# Patient Record
Sex: Male | Born: 1955 | Race: White | Hispanic: No | Marital: Married | Smoking: Former smoker
Health system: Southern US, Community
[De-identification: ages and names within clinical notes are randomized; demographics above are authoritative.]

## PROBLEM LIST (undated history)

## (undated) DIAGNOSIS — I10 Essential (primary) hypertension: Secondary | ICD-10-CM

## (undated) DIAGNOSIS — E039 Hypothyroidism, unspecified: Secondary | ICD-10-CM

## (undated) DIAGNOSIS — E7800 Pure hypercholesterolemia, unspecified: Secondary | ICD-10-CM

## (undated) DIAGNOSIS — I495 Sick sinus syndrome: Secondary | ICD-10-CM

## (undated) DIAGNOSIS — D759 Disease of blood and blood-forming organs, unspecified: Secondary | ICD-10-CM

## (undated) DIAGNOSIS — Z87442 Personal history of urinary calculi: Secondary | ICD-10-CM

## (undated) DIAGNOSIS — I480 Paroxysmal atrial fibrillation: Secondary | ICD-10-CM

## (undated) DIAGNOSIS — Z45010 Encounter for checking and testing of cardiac pacemaker pulse generator [battery]: Secondary | ICD-10-CM

## (undated) DIAGNOSIS — N189 Chronic kidney disease, unspecified: Secondary | ICD-10-CM

## (undated) DIAGNOSIS — R001 Bradycardia, unspecified: Secondary | ICD-10-CM

## (undated) DIAGNOSIS — G709 Myoneural disorder, unspecified: Secondary | ICD-10-CM

## (undated) HISTORY — PX: LITHOTRIPSY: SUR834

## (undated) HISTORY — DX: Pure hypercholesterolemia, unspecified: E78.00

## (undated) HISTORY — DX: Hypothyroidism, unspecified: E03.9

## (undated) HISTORY — DX: Sick sinus syndrome: I49.5

## (undated) HISTORY — DX: Personal history of urinary calculi: Z87.442

## (undated) HISTORY — DX: Paroxysmal atrial fibrillation: I48.0

## (undated) HISTORY — DX: Encounter for checking and testing of cardiac pacemaker pulse generator (battery): Z45.010

## (undated) HISTORY — DX: Bradycardia, unspecified: R00.1

## (undated) HISTORY — PX: CHOLECYSTECTOMY: SHX55

---

## 1998-06-22 ENCOUNTER — Inpatient Hospital Stay (HOSPITAL_COMMUNITY): Admission: EM | Admit: 1998-06-22 | Discharge: 1998-06-25 | Payer: Self-pay | Admitting: *Deleted

## 1998-06-23 ENCOUNTER — Encounter: Payer: Self-pay | Admitting: Gastroenterology

## 1998-06-24 ENCOUNTER — Encounter: Payer: Self-pay | Admitting: Gastroenterology

## 1998-06-28 ENCOUNTER — Ambulatory Visit (HOSPITAL_COMMUNITY): Admission: RE | Admit: 1998-06-28 | Discharge: 1998-06-28 | Payer: Self-pay | Admitting: Urology

## 1998-06-28 ENCOUNTER — Encounter: Payer: Self-pay | Admitting: Urology

## 1998-07-05 ENCOUNTER — Encounter: Payer: Self-pay | Admitting: Urology

## 1998-07-06 ENCOUNTER — Encounter: Payer: Self-pay | Admitting: Urology

## 1998-07-06 ENCOUNTER — Inpatient Hospital Stay (HOSPITAL_COMMUNITY): Admission: EM | Admit: 1998-07-06 | Discharge: 1998-07-07 | Payer: Self-pay | Admitting: Urology

## 1998-07-07 ENCOUNTER — Encounter: Payer: Self-pay | Admitting: Urology

## 1998-10-06 ENCOUNTER — Ambulatory Visit (HOSPITAL_COMMUNITY): Admission: RE | Admit: 1998-10-06 | Discharge: 1998-10-06 | Payer: Self-pay | Admitting: Urology

## 1998-11-04 ENCOUNTER — Ambulatory Visit (HOSPITAL_COMMUNITY): Admission: RE | Admit: 1998-11-04 | Discharge: 1998-11-04 | Payer: Self-pay | Admitting: Urology

## 1998-11-04 ENCOUNTER — Encounter: Payer: Self-pay | Admitting: Urology

## 1999-06-21 ENCOUNTER — Encounter: Payer: Self-pay | Admitting: Urology

## 1999-06-21 ENCOUNTER — Encounter: Admission: RE | Admit: 1999-06-21 | Discharge: 1999-06-21 | Payer: Self-pay | Admitting: Urology

## 1999-11-24 ENCOUNTER — Encounter: Admission: RE | Admit: 1999-11-24 | Discharge: 1999-11-24 | Payer: Self-pay | Admitting: Urology

## 1999-11-24 ENCOUNTER — Encounter: Payer: Self-pay | Admitting: Urology

## 2000-06-15 ENCOUNTER — Encounter: Admission: RE | Admit: 2000-06-15 | Discharge: 2000-06-15 | Payer: Self-pay | Admitting: Urology

## 2000-06-15 ENCOUNTER — Encounter: Payer: Self-pay | Admitting: Urology

## 2000-06-19 ENCOUNTER — Encounter: Payer: Self-pay | Admitting: Urology

## 2000-06-19 ENCOUNTER — Encounter: Admission: RE | Admit: 2000-06-19 | Discharge: 2000-06-19 | Payer: Self-pay | Admitting: Urology

## 2001-04-02 ENCOUNTER — Encounter: Admission: RE | Admit: 2001-04-02 | Discharge: 2001-04-02 | Payer: Self-pay | Admitting: Urology

## 2001-04-02 ENCOUNTER — Encounter: Payer: Self-pay | Admitting: Urology

## 2001-04-26 ENCOUNTER — Inpatient Hospital Stay (HOSPITAL_COMMUNITY): Admission: EM | Admit: 2001-04-26 | Discharge: 2001-04-28 | Payer: Self-pay | Admitting: Emergency Medicine

## 2001-04-26 ENCOUNTER — Encounter: Payer: Self-pay | Admitting: Emergency Medicine

## 2001-04-26 ENCOUNTER — Encounter: Payer: Self-pay | Admitting: Internal Medicine

## 2001-09-27 ENCOUNTER — Encounter: Payer: Self-pay | Admitting: Urology

## 2001-09-27 ENCOUNTER — Encounter: Admission: RE | Admit: 2001-09-27 | Discharge: 2001-09-27 | Payer: Self-pay | Admitting: Urology

## 2001-10-03 ENCOUNTER — Ambulatory Visit (HOSPITAL_COMMUNITY): Admission: RE | Admit: 2001-10-03 | Discharge: 2001-10-03 | Payer: Self-pay | Admitting: Urology

## 2001-10-03 ENCOUNTER — Encounter: Payer: Self-pay | Admitting: Urology

## 2002-03-28 ENCOUNTER — Encounter: Payer: Self-pay | Admitting: Urology

## 2002-03-28 ENCOUNTER — Encounter: Admission: RE | Admit: 2002-03-28 | Discharge: 2002-03-28 | Payer: Self-pay | Admitting: Urology

## 2003-11-30 ENCOUNTER — Ambulatory Visit (HOSPITAL_COMMUNITY): Admission: RE | Admit: 2003-11-30 | Discharge: 2003-11-30 | Payer: Self-pay | Admitting: Urology

## 2008-12-20 ENCOUNTER — Emergency Department (HOSPITAL_COMMUNITY): Admission: EM | Admit: 2008-12-20 | Discharge: 2008-12-20 | Payer: Self-pay | Admitting: Emergency Medicine

## 2008-12-21 ENCOUNTER — Emergency Department (HOSPITAL_COMMUNITY): Admission: EM | Admit: 2008-12-21 | Discharge: 2008-12-21 | Payer: Self-pay | Admitting: Emergency Medicine

## 2008-12-23 ENCOUNTER — Observation Stay (HOSPITAL_COMMUNITY): Admission: EM | Admit: 2008-12-23 | Discharge: 2008-12-25 | Payer: Self-pay | Admitting: Emergency Medicine

## 2008-12-23 ENCOUNTER — Ambulatory Visit: Payer: Self-pay | Admitting: Gastroenterology

## 2008-12-24 ENCOUNTER — Encounter: Payer: Self-pay | Admitting: Gastroenterology

## 2008-12-30 ENCOUNTER — Telehealth (INDEPENDENT_AMBULATORY_CARE_PROVIDER_SITE_OTHER): Payer: Self-pay | Admitting: *Deleted

## 2009-01-16 ENCOUNTER — Encounter: Payer: Self-pay | Admitting: Gastroenterology

## 2009-01-19 ENCOUNTER — Encounter: Payer: Self-pay | Admitting: Gastroenterology

## 2009-01-21 ENCOUNTER — Telehealth: Payer: Self-pay | Admitting: Gastroenterology

## 2009-01-26 ENCOUNTER — Telehealth (INDEPENDENT_AMBULATORY_CARE_PROVIDER_SITE_OTHER): Payer: Self-pay | Admitting: *Deleted

## 2009-01-26 ENCOUNTER — Encounter: Payer: Self-pay | Admitting: Gastroenterology

## 2009-01-26 DIAGNOSIS — K831 Obstruction of bile duct: Secondary | ICD-10-CM | POA: Insufficient documentation

## 2009-02-01 ENCOUNTER — Ambulatory Visit (HOSPITAL_COMMUNITY): Admission: RE | Admit: 2009-02-01 | Discharge: 2009-02-01 | Payer: Self-pay | Admitting: Urology

## 2009-02-11 ENCOUNTER — Ambulatory Visit: Payer: Self-pay | Admitting: Gastroenterology

## 2009-02-11 ENCOUNTER — Ambulatory Visit (HOSPITAL_COMMUNITY): Admission: RE | Admit: 2009-02-11 | Discharge: 2009-02-11 | Payer: Self-pay | Admitting: Gastroenterology

## 2009-03-08 ENCOUNTER — Ambulatory Visit: Payer: Self-pay | Admitting: Gastroenterology

## 2009-03-09 LAB — CONVERTED CEMR LAB
ALT: 33 units/L (ref 0–53)
AST: 23 units/L (ref 0–37)
Albumin: 3.8 g/dL (ref 3.5–5.2)
Basophils Absolute: 0 10*3/uL (ref 0.0–0.1)
CO2: 29 meq/L (ref 19–32)
Calcium: 9.1 mg/dL (ref 8.4–10.5)
Eosinophils Absolute: 0.6 10*3/uL (ref 0.0–0.7)
Hemoglobin: 14 g/dL (ref 13.0–17.0)
Lymphocytes Relative: 30.3 % (ref 12.0–46.0)
MCHC: 34.2 g/dL (ref 30.0–36.0)
Neutro Abs: 4.2 10*3/uL (ref 1.4–7.7)
Neutrophils Relative %: 53.8 % (ref 43.0–77.0)
Platelets: 150 10*3/uL (ref 150.0–400.0)
RDW: 14 % (ref 11.5–14.6)
Total Protein: 6.8 g/dL (ref 6.0–8.3)

## 2009-03-10 ENCOUNTER — Ambulatory Visit: Payer: Self-pay | Admitting: Gastroenterology

## 2010-03-21 ENCOUNTER — Telehealth: Payer: Self-pay | Admitting: Gastroenterology

## 2010-03-29 ENCOUNTER — Encounter (INDEPENDENT_AMBULATORY_CARE_PROVIDER_SITE_OTHER): Payer: Self-pay | Admitting: *Deleted

## 2010-03-29 ENCOUNTER — Ambulatory Visit: Payer: Self-pay | Admitting: Gastroenterology

## 2010-03-29 ENCOUNTER — Ambulatory Visit: Payer: Self-pay | Admitting: Cardiology

## 2010-03-30 LAB — CONVERTED CEMR LAB
Basophils Absolute: 0.1 10*3/uL (ref 0.0–0.1)
Hemoglobin: 14.4 g/dL (ref 13.0–17.0)
Lymphocytes Relative: 30.7 % (ref 12.0–46.0)
Monocytes Relative: 7.6 % (ref 3.0–12.0)
Neutro Abs: 4 10*3/uL (ref 1.4–7.7)
RDW: 13.9 % (ref 11.5–14.6)

## 2010-05-23 ENCOUNTER — Ambulatory Visit
Admission: RE | Admit: 2010-05-23 | Discharge: 2010-05-23 | Payer: Self-pay | Source: Home / Self Care | Attending: Gastroenterology | Admitting: Gastroenterology

## 2010-05-23 ENCOUNTER — Encounter: Payer: Self-pay | Admitting: Gastroenterology

## 2010-06-14 NOTE — Letter (Signed)
Summary: [facility] Instructions  [facility]  [address] [place], [place] [address]   Phone: [phone]  Fax: [phone]       Marco Morris    Jul 22, 1955    MRN: [id number]        Procedure Day /Date:05/23/10 MON     Arrival Time:230 pm     Procedure Time:330 pm     Location of Procedure:                    [facility] ([facility])                        PREPARATION FOR COLONOSCOPY WITH MOVIPREP   Starting 5 days prior to your procedure 05/18/10 do not eat nuts, seeds, popcorn, corn, beans, peas,  salads, or any raw vegetables.  Do not take any fiber supplements (e.g. Metamucil, Citrucel, and Benefiber).  THE DAY BEFORE YOUR PROCEDURE         DATE: 05/22/10  DAY: SUN  1.  Drink clear liquids the entire day-NO SOLID FOOD  2.  Do not drink anything colored red or purple.  Avoid juices with pulp.  No orange juice.  3.  Drink at least 64 oz. (8 glasses) of fluid/clear liquids during the day to prevent dehydration and help the prep work efficiently.  CLEAR LIQUIDS INCLUDE: Water Jello Ice Popsicles Tea (sugar ok, no milk/cream) Powdered fruit flavored drinks Coffee (sugar ok, no milk/cream) Gatorade Juice: apple, white grape, white cranberry  Lemonade Clear bullion, consomm, broth Carbonated beverages (any kind) Strained chicken noodle soup Hard Candy                             4.  In the morning, mix first dose of MoviPrep solution:    Empty 1 Pouch A and 1 Pouch B into the disposable container    Add lukewarm drinking water to the top line of the container. Mix to dissolve    Refrigerate (mixed solution should be used within 24 hrs)  5.  Begin drinking the prep at 5:00 p.m. The MoviPrep container is divided by 4 marks.   Every 15 minutes drink the solution down to the next mark (approximately 8 oz) until the full liter is complete.   6.  Follow completed prep with 16 oz of clear liquid of your choice (Nothing red or purple).   Continue to drink clear liquids until bedtime.  7.  Before going to bed, mix second dose of MoviPrep solution:    Empty 1 Pouch A and 1 Pouch B into the disposable container    Add lukewarm drinking water to the top line of the container. Mix to dissolve    Refrigerate  THE DAY OF YOUR PROCEDURE      DATE: 05/23/10 DAY: MON  Beginning at 1030 a.m. (5 hours before procedure):         1. Every 15 minutes, drink the solution down to the next mark (approx 8 oz) until the full liter is complete.  2. Follow completed prep with 16 oz. of clear liquid of your choice.    3. You may drink clear liquids until 130 pm (2 HOURS BEFORE PROCEDURE).   MEDICATION INSTRUCTIONS  Unless otherwise instructed, you should take regular prescription medications with a small sip of water   as early as possible the morning of your procedure.  OTHER INSTRUCTIONS  You will need a responsible adult at least 57 years of age to accompany you and drive you home.   This person must remain in the waiting room during your procedure.  Wear loose fitting clothing that is easily removed.  Leave jewelry and other valuables at home.  However, you may wish to bring a book to read or  an iPod/MP3 player to listen to music as you wait for your procedure to start.  Remove all body piercing jewelry and leave at home.  Total time from sign-in until discharge is approximately 2-3 hours.  You should go home directly after your procedure and rest.  You can resume normal activities the  day after your procedure.  The day of your procedure you should not:   Drive   Make legal decisions   Operate machinery   Drink alcohol   Return to work  You will receive specific instructions about eating, activities and medications before you leave.    The above instructions have been reviewed and explained to me by   _______________________    I fully understand and can verbalize these instructions  _____________________________ Date _________

## 2010-06-14 NOTE — Progress Notes (Signed)
Summary: triage  Phone Note Other Incoming Call back at [phone]   Caller: Patient's Wife Details for Reason: triage Summary of Call: Pt.'s wife, Erskine Squibb, called regarding scheduling an appt/colon with Dr. Christella Hartigan. Pt. is having blood in his stool everytime he has a bm. Father & brother w/hx of polyps. As first appt is December & first colon is January, please call wife as to what to do. Thanks. Initial call taken by: Schuyler Amor,  March 21, 2010 9:58 AM  Follow-up for Phone Call        Pt has BRB with every bowel movement, normal BM;s otherwise.  No nausea, no hx of hemorrrhoids.  Back pain, had labs done at PCP 02/07/10  Hgb was normal.  Appt given with Dr Christella Hartigan for 03/29/10.  Dr Christella Hartigan is this ok or should he be seen sooner Follow-up by: Chales Abrahams CMA Duncan Dull),  March 21, 2010 11:13 AM  Additional Follow-up for Phone Call Additional follow up Details #1::        that is OK,   I recommended colonoscopy to him a year ago, he was not interested Additional Follow-up by: Rachael Fee MD,  March 21, 2010 2:03 PM

## 2010-06-14 NOTE — Assessment & Plan Note (Signed)
Review of gastrointestinal problems: 1. Remote cholecystecomy; August 2010: CBD stones presented to [facility] ER with elevated liver tests, abd pains; underwent ERCP, removal of CBD stones, stent placement;  Repeat ERCP by Dr. Christella Hartigan 01/2009 showed no retained stones, stent was removed.   History of Present Illness Primary GI MD: Rob Bunting MD Primary Takita Riecke: Ralene Ok, MD Chief Complaint: Intermittant rectal bleeding and blood in stool with some diarrhea and looser stools. Denies any abd pain. History of Present Illness:     very pleasant 55 year old man whom I last saw about a year ago. At that pointI recommended he consider a colonoscopy for routine risk colon cancer screening. He declined.  who has been having about 6 months of blood in stools (red) about 1-3 times a week.  A couple times the water turned red.  The stool consistency has changed (a bit looser than usual).  Never had colonoscopy.  Overall weight is stable.  intermittent SOB when heart flutters (has intermittent afib).  Not on coumadin.           Current Medications (verified): 1)  Toprol Xl 100 Mg Xr24h-Tab (Metoprolol Succinate) .... Take One By Mouth Once Daily 2)  Aspirin 81 Mg Tbec (Aspirin) .... Take One By Mouth Once Daily 3)  Synthroid 75 Mcg Tabs (Levothyroxine Sodium) .... Take One By Mouth Once Daily 4)  Simvastatin 20 Mg Tabs (Simvastatin) .... One Table By Mouth Once Daily  Allergies (verified): No Known Drug Allergies  Family History: no colon cancer brother and dad had colon polyps removed  Vital Signs:  Patient profile:   55 year old male Height:      73 inches Weight:      183.13 pounds BMI:     24.25 Pulse rate:   80 / minute Pulse rhythm:   regular BP sitting:   136 / 74  (right arm) Cuff size:   regular  Vitals Entered By: Christie Nottingham CMA Duncan Dull) (March 29, 2010 2:42 PM)  Physical Exam  Additional Exam:  Constitutional: generally well appearing Psychiatric: alert  and oriented times 3 Abdomen: soft, non-tender, non-distended, normal bowel sounds    Impression & Recommendations:  Problem # 1:  Intermittent rectal bleeding rectal exam deferred today for upcoming colonoscopy. I suspect benign anorectal source. We will proceed with colonoscopy at his soonest convenience and he will also get a CBC today to check to see if he is anemic, he does not appear to be so clinically.  Other Orders: TLB-CBC Platelet - w/Differential (85025-CBCD)  Patient Instructions: 1)  You will get lab test(s) done today (cbc). 2)  You will be scheduled to have a colonoscopy. 3)  A copy of this information will be sent to Dr. Cindee Lame. 4)  The medication list was reviewed and reconciled.  All changed / newly prescribed medications were explained.  A complete medication list was provided to the patient / caregiver.  Appended Document: Orders Update/movi    Clinical Lists Changes  Medications: Added new medication of MOVIPREP 100 GM  SOLR (PEG-KCL-NACL-NASULF-NA ASC-C) As per prep instructions. - Signed Rx of MOVIPREP 100 GM  SOLR (PEG-KCL-NACL-NASULF-NA ASC-C) As per prep instructions.;  #1 x 0;  Signed;  Entered by: Chales Abrahams CMA (AAMA);  Authorized by: Rachael Fee MD;  Method used: Electronically to [facility] [phone]*, [address] [place], [place]  [address], Ph: [phone], Fax: [phone] Orders: Added new Test order of Colonoscopy (Colon) - Signed    Prescriptions: MOVIPREP 100 GM  SOLR (PEG-KCL-NACL-NASULF-NA ASC-C) As per prep instructions.  #1 x 0   Entered by:   Chales Abrahams CMA (AAMA)   Authorized by:   Rachael Fee MD   Signed by:   Chales Abrahams CMA (AAMA) on 03/29/2010   Method used:   Electronically to        [facility] [phone] (retail)       [address]       [place], [place]  [address]       Ph: [phone]       Fax: [phone]   RxID:   [id number]

## 2010-06-16 NOTE — Procedures (Signed)
Summary: Colonoscopy  Patient: [name] Note: All result statuses are Final unless otherwise noted.  Tests: (1) Colonoscopy (COL)   COL Colonoscopy           DONE     [facility]     [address]     [place], [place]  [address]           COLONOSCOPY PROCEDURE REPORT           PATIENT:  Marco, Morris  MR#:  [id number]     BIRTHDATE:  25-Oct-1955, 54 yrs. old  GENDER:  male     ENDOSCOPIST:  Rachael Fee, MD     REF. BY:  Ralene Ok, M.D.     PROCEDURE DATE:  05/23/2010     PROCEDURE:  Diagnostic Colonoscopy     ASA CLASS:  Class II     INDICATIONS:  mild, intermittent rectal bleeding     MEDICATIONS:   Fentanyl 75 mcg IV, Versed 7 mg IV           DESCRIPTION OF PROCEDURE:   After the risks benefits and     alternatives of the procedure were thoroughly explained, informed     consent was obtained.  Digital rectal exam was performed and     revealed no rectal masses.   The LB PCF-[id number] endoscope     was introduced through the anus and advanced to the cecum, which     was identified by both the appendix and ileocecal valve, without     limitations.  The quality of the prep was good, using MoviPrep.     The instrument was then slowly withdrawn as the colon was fully     examined.     <<PROCEDUREIMAGES>>     FINDINGS:  Mild diverticulosis was found in the sigmoid to     descending colon segments (see image1).  This was otherwise a     normal examination of the colon (see image2 and image3).     Retroflexed views in the rectum revealed no abnormalities.    The     scope was then withdrawn from the patient and the procedure     completed.     COMPLICATIONS:  None           ENDOSCOPIC IMPRESSION:     1) Mild diverticulosis in the sigmoid to descending colon     segments     2) Otherwise normal examination; no polyps or cancers           RECOMMENDATIONS:     1) You should continue to follow colorectal cancer screening     guidelines for "routine  risk" patients with a repeat colonoscopy     in 10 years. There is no need for FOBT (stool) testing for at     least 5 years.           REPEAT EXAM:  10 years           ______________________________     Rachael Fee, MD           n.     eSIGNED:   Rachael Fee at 05/23/2010 03:21 PM           Geoffry Paradise, [id number]  Note: An exclamation mark (!) indicates a result that was not dispersed into the flowsheet. Document Creation Date: 05/23/2010 3:21 PM _______________________________________________________________________  (1) Order result status: Final Collection or observation date-time: 05/23/2010 15:17 Requested date-time:  Receipt date-time:  Reported date-time:  Referring Physician:   Ordering Physician: Rob Bunting [id number]) Specimen Source:  Source: Launa Grill Order Number: [id number] Lab site:   Appended Document: Colonoscopy    Clinical Lists Changes  Observations: Added new observation of COLONNXTDUE: 05/2020 (05/23/2010 16:41)

## 2010-07-02 ENCOUNTER — Emergency Department (HOSPITAL_COMMUNITY)
Admission: EM | Admit: 2010-07-02 | Discharge: 2010-07-02 | Disposition: A | Discharge status: Home / Self Care | Payer: Managed Care, Other (non HMO) | Attending: Emergency Medicine | Admitting: Emergency Medicine

## 2010-07-02 DIAGNOSIS — I4891 Unspecified atrial fibrillation: Secondary | ICD-10-CM | POA: Insufficient documentation

## 2010-07-02 DIAGNOSIS — E039 Hypothyroidism, unspecified: Secondary | ICD-10-CM | POA: Insufficient documentation

## 2010-07-02 DIAGNOSIS — M79609 Pain in unspecified limb: Secondary | ICD-10-CM

## 2010-07-02 DIAGNOSIS — E7800 Pure hypercholesterolemia, unspecified: Secondary | ICD-10-CM | POA: Insufficient documentation

## 2010-08-20 LAB — COMPREHENSIVE METABOLIC PANEL
Alkaline Phosphatase: 91 U/L (ref 39–117)
BUN: 10 mg/dL (ref 6–23)
Chloride: 100 mEq/L (ref 96–112)
Glucose, Bld: 130 mg/dL — ABNORMAL HIGH (ref 70–99)
Potassium: 3.6 mEq/L (ref 3.5–5.1)
Total Bilirubin: 0.7 mg/dL (ref 0.3–1.2)

## 2010-08-20 LAB — DIFFERENTIAL
Basophils Absolute: 0 10*3/uL (ref 0.0–0.1)
Basophils Relative: 0 % (ref 0–1)
Neutro Abs: 3.3 10*3/uL (ref 1.7–7.7)
Neutrophils Relative %: 66 % (ref 43–77)

## 2010-08-20 LAB — CBC
HCT: 36.2 % — ABNORMAL LOW (ref 39.0–52.0)
Hemoglobin: 12.5 g/dL — ABNORMAL LOW (ref 13.0–17.0)
WBC: 5 10*3/uL (ref 4.0–10.5)

## 2010-08-20 LAB — URINALYSIS, ROUTINE W REFLEX MICROSCOPIC
Glucose, UA: NEGATIVE mg/dL
Hgb urine dipstick: NEGATIVE
Specific Gravity, Urine: 1.014 (ref 1.005–1.030)
Urobilinogen, UA: 4 mg/dL — ABNORMAL HIGH (ref 0.0–1.0)

## 2010-08-20 LAB — CARDIAC PANEL(CRET KIN+CKTOT+MB+TROPI)
Relative Index: INVALID (ref 0.0–2.5)
Relative Index: INVALID (ref 0.0–2.5)
Relative Index: INVALID (ref 0.0–2.5)
Total CK: 50 U/L (ref 7–232)
Troponin I: 0.01 ng/mL (ref 0.00–0.06)
Troponin I: 0.03 ng/mL (ref 0.00–0.06)

## 2010-08-20 LAB — POCT CARDIAC MARKERS
CKMB, poc: <1 ng/mL — ABNORMAL LOW (ref 1.0–8.0)
CKMB, poc: <1 ng/mL — ABNORMAL LOW (ref 1.0–8.0)
Myoglobin, poc: 82.1 ng/mL (ref 12–200)

## 2010-08-20 LAB — LIPID PANEL
Cholesterol: 116 mg/dL (ref 0–200)
Total CHOL/HDL Ratio: 3.3 RATIO

## 2010-08-21 LAB — URINALYSIS, ROUTINE W REFLEX MICROSCOPIC
Bilirubin Urine: NEGATIVE
Hgb urine dipstick: NEGATIVE
Ketones, ur: NEGATIVE mg/dL
Nitrite: NEGATIVE
Nitrite: NEGATIVE
Protein, ur: NEGATIVE mg/dL
Urobilinogen, UA: 1 mg/dL (ref 0.0–1.0)
Urobilinogen, UA: 2 mg/dL — ABNORMAL HIGH (ref 0.0–1.0)
pH: 6 (ref 5.0–8.0)

## 2010-08-21 LAB — CBC
Hemoglobin: 12.2 g/dL — ABNORMAL LOW (ref 13.0–17.0)
MCHC: 33.6 g/dL (ref 30.0–36.0)
MCV: 88.8 fL (ref 78.0–100.0)
RBC: 4.1 MIL/uL — ABNORMAL LOW (ref 4.22–5.81)
WBC: 6.8 10*3/uL (ref 4.0–10.5)

## 2010-08-21 LAB — URINE CULTURE
Colony Count: NO GROWTH
Culture: NO GROWTH

## 2010-08-21 LAB — BASIC METABOLIC PANEL
CO2: 26 mEq/L (ref 19–32)
Calcium: 8.2 mg/dL — ABNORMAL LOW (ref 8.4–10.5)
Chloride: 102 mEq/L (ref 96–112)
Creatinine, Ser: 1.19 mg/dL (ref 0.4–1.5)
GFR calc Af Amer: >60 mL/min (ref >60)
Sodium: 135 mEq/L (ref 135–145)

## 2010-08-21 LAB — DIFFERENTIAL
Basophils Absolute: 0 10*3/uL (ref 0.0–0.1)
Basophils Relative: 0 % (ref 0–1)
Eosinophils Absolute: 0 10*3/uL (ref 0.0–0.7)
Eosinophils Relative: 0 % (ref 0–5)
Lymphocytes Relative: 8 % — ABNORMAL LOW (ref 12–46)
Monocytes Absolute: 0.7 10*3/uL (ref 0.1–1.0)

## 2010-08-21 LAB — URINE MICROSCOPIC-ADD ON

## 2010-08-22 ENCOUNTER — Other Ambulatory Visit: Payer: Self-pay | Admitting: *Deleted

## 2010-08-22 DIAGNOSIS — I480 Paroxysmal atrial fibrillation: Secondary | ICD-10-CM

## 2010-08-22 MED ORDER — METOPROLOL SUCCINATE ER 100 MG PO TB24: 100.0000 mg | ORAL_TABLET | Freq: Every day | ORAL | Status: DC

## 2010-08-22 NOTE — Telephone Encounter (Signed)
escribe medication per fax request  

## 2010-09-27 NOTE — Consult Note (Signed)
NAMEDETRELL, UMSCHEID NO.:  [id number]   MEDICAL RECORD NO.:  [id number]          PATIENT TYPE:  OBV   LOCATION:  2040                         FACILITY:  [facility]   PHYSICIAN:  Peter M. Swaziland, M.D.  DATE OF BIRTH:  1956-01-26   DATE OF CONSULTATION:  12/23/2008  DATE OF DISCHARGE:                                 CONSULTATION   HISTORY OF PRESENT ILLNESS:  Mr. Marco Morris is a 55 year old white male who  is well known to me.  He has a history of paroxysmal atrial fibrillation  dating back to 2003.  This has been well controlled with beta-blocker  therapy.  He also has a history of hypothyroidism and elevated  cholesterol.  He has no known history of coronary artery disease.  He  presents with a 1-week history of refractory nausea and vomiting.  He  has had no hematemesis.  He has complained of mid abdominal pain  radiating to his left flank.  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  Isabel Caprice.  In fact, Dr. Isabel Caprice saw him on Monday and placed him on  antibiotic therapy.  He has had prior cholecystectomy.  Last evening  after episode of vomiting, the patient developed pain in his epigastric  area radiating to his lower left sternum.  It was nonradiating.  It  lasted over an hour and was resolved.  He has had no recurrent pain  today.  He has had a lot of indigestion symptoms.   PAST MEDICAL HISTORY:  1. Paroxysmal atrial fibrillation.  2. Hyperlipidemia.  3. Hypothyroidism.  4. He has had prior cholecystectomy as well as history of left renal      calculus.  5. He has had a lymph node removed behind his ear.   He is allergic to CODEINE.   CURRENT MEDICATIONS:  1. Toprol-XL 100 mg daily.  2. Lovenox 40 mg subcu daily.  3. Protonix 40 mg b.i.d.  4. Simvastatin 40 mg per day.  5. Levothyroxine 125 mcg per day.  6. Cipro 500 mg q.8 h.  7. Aspirin 81 mg per day.   SOCIAL HISTORY:  The patient has  worked as a Medical sales representative.  He is married and has 2 children.  He denies tobacco or alcohol use.   FAMILY HISTORY:  Father had a myocardial infarction and bypass surgery  in the past.  Mother has also had bypass surgery and previous heart  attack.  One brother has hypertension.  There is some family history of  a coagulation disorder with venous thrombosis in the family.   REVIEW OF SYSTEMS:  Otherwise, unremarkable.   PHYSICAL EXAMINATION:  GENERAL:  He is pleasant white male in no  apparent distress.  VITAL SIGNS:  Blood pressure is 107/72, pulse is 72 and in sinus rhythm.  He is afebrile.  HEENT:  Normocephalic and atraumatic.  Pupils are equal, round, and  reactive to light and accommodation.  Sclerae are clear and anicteric.  Oropharynx is clear.  NECK:  Without JVD, adenopathy,  thyromegaly, or bruits.  LUNGS:  Clear.  CARDIAC:  Regular rate and rhythm without murmur, rub, or gallop.  ABDOMEN:  Soft and nontender without mass or bruits.  Bowel sounds are  positive.  EXTREMITIES:  Without edema.  Pulses are 2+ and symmetric.  SKIN:  Warm and dry.  NEUROLOGIC:  He is alert and oriented x4.  His cranial nerves II through  XII are intact.   LABORATORY DATA:  White count is 5000, hemoglobin 12.5, hematocrit 36.2,  and platelets 93,000.  Sodium is 134, potassium 3.6, chloride 100, CO2  29, BUN 10, creatinine 1.09, glucose of 130.  AST is 49.  Cardiac  enzymes are negative x3.  ECG is normal.  Chest x-ray shows no active  disease.   IMPRESSION:  1. Noncardiac chest pain.  This is predominantly epigastric pain.  He      presented with a 1-week history of refractory nausea and vomiting.      I think his chest pain is gastric or esophageal in nature.  2. Left renal calculus with colic.  3. Paroxysmal atrial fibrillation.  4. Anemia.  5. Thrombocytopenia.   PLAN:  Given his thrombocytopenia, we have recommended discontinuing his  Lovenox.  We will continue with  Toprol.  He is on a proton pump  inhibitor.  I will consider GI evaluation for possible upper endoscopy.  I do not feel his symptoms are cardiac related and I do not feel that  any further cardiac workup is needed at this time.           ______________________________  Peter M. Swaziland, M.D.     PMJ/MEDQ  D:  12/23/2008  T:  12/23/2008  Job:  [id number]   cc:   Valetta Fuller, M.D.  Dr. Joselyn Glassman

## 2010-09-27 NOTE — H&P (Signed)
NAMEJAHRELL, HAMOR NO.:  [id number]   MEDICAL RECORD NO.:  [id number]          PATIENT TYPE:  INP   LOCATION:  2040                         FACILITY:  [facility]   PHYSICIAN:  Massie Maroon, MD        DATE OF BIRTH:  11-Feb-1956   DATE OF ADMISSION:  12/23/2008  DATE OF DISCHARGE:                              HISTORY & PHYSICAL   CHIEF COMPLAINT:  Chest pain.   HISTORY OF PRESENT ILLNESS:  A 55 year old male with a history of  hyperlipidemia, atrial fibrillation complains of chest pain that he felt  was like a burning in his chest in the substernal area.  The patient was  sitting at rest, watching TV when it occurred.  It occurred around 7:30  p.m. last night.  The patient denied any radiation of the pain.  He also  denied cough, fever, chills, palpitations, shortness of breath, nausea,  vomiting.  The patient was given sublingual nitroglycerin in the ED  without relief.  The patient cannot recall any recent stress test.  EKG  showed normal sinus rhythm at 75, no significant change compared to  prior EKG.  Chest x-ray was negative for any acute process.  Initial  troponin-I was negative.  The patient will be admitted for observation  of chest pain.   PAST MEDICAL HISTORY:  1. Hyperlipidemia.  2. Atrial fibrillation.  3. Hypothyroidism.  4. Nephrolithiasis.   PAST SURGICAL HISTORY:  1. Cholecystectomy.  2. Left ear lymph node biopsy.  3. Nephrolithiasis status post lithotripsy and laser   SOCIAL HISTORY:  The patient does not smoke or drink.  He quit smoking  about 30 years ago.  He smokes one pack per day times 10-15 years.   FAMILY HISTORY:  Mother is alive at age 48.  She has hypertension,  diabetes, hyperlipidemia and had a CABG.  She is a nonsmoker.  Father is  alive at age 40 and has atrial fibrillation, diabetes, hypertension,  hyperlipidemia.  He was a former smoker.  The patient has one brother  who had a blood clot in his leg.  He also had a  ruptured spleen without  trauma.  He apparently has prothrombin gene mutation.   REVIEW OF SYSTEMS:  Negative for all 10 organ systems except for  pertinent positives as stated above.   PHYSICAL EXAMINATION:  VITAL SIGNS:  Temperature 97.4, pulse 77,  respiratory rate 20, pulse ox 90% on room air blood pressure 121/77.  HEENT:  Anicteric, EOMI, no nystagmus, pupils 1.5 mm, symmetric, direct,  consensual, near reflex intact.  Mucous membranes moist.  NECK:  No JVD, no bruit, no thyromegaly, no adenopathy.  HEART:  Regular rate rhythm.  S1-S2.  No murmurs, gallops or rubs.  LUNGS:  Clear to auscultation bilaterally.  ABDOMEN:  Soft, nontender, nondistended.  Positive bowel sounds.  EXTREMITIES:  No cyanosis, clubbing or edema.  DP pulses 2+ bilaterally.  SKIN:  No rashes.  LYMPH NODES:  No adenopathy.  Nonfocal, cranial nerves II-XII intact,  reflexes 2+, symmetric, diffuse with downgoing toes bilaterally, motor  strength 5/5 in all four  extremities, pinprick intact.   LABORATORY DATA:  WBC 5.0, hemoglobin 12.5, platelet count 93, MCV 87.7,  RDW 13.3.  Sodium 134, potassium 3.6, chloride 100, bicarb 29, BUN 10,  creatinine 1.09, AST 49 (elevated), ALT 50, alk phos 91, total bilirubin  0.7.  Repeat troponin-I is less than 0.05.  Urinalysis negative for  WBCs.   CT of the abdomen and pelvis on December 20, 2008, showed multiple renal  calculi involving the left kidney mainly.  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  No definite acute findings.  Prostate is enlarged, mild diffuse  hypertrophy of the bladder wall, seminal vesicles unremarkable.  No  lower urinary tract calculi.  No acute pelvic findings.   Chest x-ray showed no active lung disease.   ASSESSMENT/PLAN:  1. Chest pain.  The patient will be placed on telemetry.  We will      check troponin-I q.8 h x3 sets.  Consult Cardiology since we need      their  approved for any form of nuclear stress test.  Obviously, we      appreciate their input.  2. Atrial fibrillation.  Continue the patient on aspirin, Toprol XL      for rate control.  3. Hyperlipidemia.  Continue the patient on Lipitor 20 mg p.o. q.h.s.  4. Hypothyroidism.  The patient will continue on Synthroid.  We will      check a TSH.  5. Urinary tract infection/pyelo.  Continue Cipro.  6. DVT prophylaxis.  SCDs and TEDs.      Massie Maroon, MD  Electronically Signed     JYK/MEDQ  D:  12/23/2008  T:  12/23/2008  Job:  [id number]   cc:   Ralene Ok, M.D.  Peter M. Swaziland, M.D.  Valetta Fuller, M.D.

## 2010-09-27 NOTE — Discharge Summary (Signed)
NAMEOTHELL, JAIME NO.:  [id number]   MEDICAL RECORD NO.:  [id number]          PATIENT TYPE:  OBV   LOCATION:  2040                         FACILITY:  [facility]   PHYSICIAN:  Charlestine Massed, MDDATE OF BIRTH:  [birth year]   DATE OF ADMISSION:  12/22/2008  DATE OF DISCHARGE:  12/25/2008                               DISCHARGE SUMMARY   PRIMARY CARE PHYSICIAN:  Ralene Ok, MD   CARDIOLOGY:  Peter M. Swaziland, MD   UROLOGY:  Valetta Fuller, MD   GASTRO:  Rob Bunting, MD   REASON FOR ADMISSION:  Pain on the left side of the chest and slightly  above the left costal margin.   DISCHARGE DIAGNOSES:  1. Chest pain, noncardiac origin.  2. Paroxysmal atrial fibrillation, not on Coumadin therapy.  3. Left-sided renal calculus with atrophic kidney with origin of pain      possibly from the left kidney.  4. Duodenitis.  5. Dyslipidemia.  6. Hypothyroidism.  7. Prior history of cholecystectomy.   DISCHARGE MEDICATIONS:  1. Enteric-coated aspirin 81 mg p.o. daily.  2. Prilosec 20 mg by mouth daily.  3. Synthroid 125 mcg by mouth daily.  4. Toprol-XL 50 mg p.o. daily changed from 100 mg daily.  5. MiraLax 17 g in 18-ounce of water daily in a.m. by mouth.  6. Lipitor 20 mg p.o. nightly.  7. Celexa 20 mg p.o. daily.  8. Tylenol 650 mg p.o. 4 times daily as needed for pain.   HOSPITAL COURSE:  1. Chest pain - noncardiac - Mr. Suyash Amory is a 55 year old      gentleman, who came to the hospital with complaints of pain on the      left side of the chest, which is persistent for more than a week.      The pain is more of pressure to achy pain, it is present more on      the left lower side of the chest mostly near the left costal      margin.  It is nonradiating.  There is no associated diaphoresis,      shortness of breath, or palpitations.  The patient has history of      left renal calculus with atrophic kidney.  The patient has history      of atrial  fibrillation also.  There are no palpitations.  No loss      of consciousness.  He was admitted to Telemetry, ruled out for      acute urinary syndrome.  He was seen by his primary cardiologist,      Dr. Swaziland, who suggested that the chest pain is not of cardiac      origin and to look for GI causes.  So, the patient had a      Gastroenterology evaluation done, and had an EGD done, which showed      evidence of duodenitis, but that was not enough to explain his      chest pain, so Gastroenterology and Cardiology together has opined      that the pain is most likely referred pain from  the left kidney,      which is calculus and is atrophic.  The patient is expected to see      Dr. Isabel Caprice today at 2:30 p.m. in his office.  Meanwhile, he will      continue Tylenol p.r.n. for pain, pain scale at this time is 4-      5/10, and the patient says much tolerable, and his pain is a lot      better today.  2. Atrial fibrillation and hypertension.  The patient has history of      hypertension and atrial fibrillation.  He was on Toprol-XL at 100      mg daily, but his blood pressure was very low as 95 systolic, so      his Toprol-XL was decreased to a dose of 50 mg daily for now.  The      patient can follow up with Dr. Swaziland or his primary care doctor      later to check his blood pressures and if they are still low, then      the dosage of Toprol can be advanced as required at the time.      Continue aspirin 81 mg daily as he was continuing before.  3. Hypothyroidism.  Continue Synthroid 125 mcg daily, no change in      dose is needed.  4. Constipation.  The patient has found to have impacted stool and so      he has been started on MiraLax daily by GI, which could also be a      factor contributing to his pain.  Currently, he has good bowel      movements.  5. Dyslipidemia.  Continue Lipitor at 20 mg daily.  6. Mild depression.  The patient was found to be less verbal and had a      flat affect  on the examination.  He has been explained about his      issues and he has been started on a low dose of Celexa 20 mg daily      to help him with this issue.  7. Duodenitis.  Continue Prilosec 20 mg p.o. daily for at least a      month. Need to follow results of EGD for H.Pylori testing and to be      treated if positive.   DISPOSITION:  Discharged back home with followup.   FOLLOWUP:  1. Dr. Isabel Caprice at 2:30 p.m. on December 25, 2008, appointment has been      made.  2. Follow up with Dr. Swaziland as required as per the appointments given      before.  3. Follow up with Dr. Ludwig Clarks as required.   INSTRUCTIONS:  Keep follow up regularly and adhere to medications  regularly.   A total of 40 minutes spent on the discharge today.      Charlestine Massed, MD  Electronically Signed     UT/MEDQ  D:  12/25/2008  T:  12/25/2008  Job:  [id number]   cc:   Ralene Ok, M.D.  Peter M. Swaziland, M.D.  Valetta Fuller, M.D.  Rachael Fee, MD

## 2010-09-30 NOTE — H&P (Signed)
[place]  Patient:    Marco Morris, Marco Morris Visit Number: [id number] MRN: [id number]          Service Type: Attending:  Lilly Cove, M.D. Dictated by:   Lilly Cove, M.D. Adm. Date:  04/26/01                           History and Physical  HISTORY:  This is a 55 year old man who gives a one-month history of intermittent palpitations and description of fluttering in his chest which usually lasts a few minutes but today it lasted approximately six hours on and off.  It was associated with dyspnea and feeling of air hunger.  He has never had any syncopal episode with this.  He has no history of coronary artery disease.  There is a family history of coagulation deficiencies resulting in deep venous thrombosis and pulmonary emboli.  His brother has an inferior vena cava filter in place.  The patient denies any leg swelling or any chest pain, especially pleuritic in nature.  PAST MEDICAL HISTORY:  He has a history of kidney stones and is on high-dose potassium supplementation.  MEDICATIONS:  Potassium supplementation.  ALLERGIES:  None.  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  He does not smoke and does not drink alcohol excessively, however, he does drink seven to eight cups of coffee per day.  FAMILY HISTORY:  As mentioned above.  REVIEW OF SYSTEMS:  Apart from the symptoms mentioned above, there are no other symptoms referable to the respiratory, musculoskeletal, neurological, endocrine, dermatological, rheumatological, psychiatric systems.  PHYSICAL EXAMINATION:  VITAL SIGNS:  Blood pressure 140/70, pulse 80 to 150 beats per minute and in atrial fibrillation intermittently.  CARDIAC:  Heart sounds are present and irregular.  Jugular venous pressure is not raised.  LUNGS:  Lung fields are clear.  ABDOMEN:  Soft and nontender with no hepatosplenomegaly.  NEUROLOGIC:  He is alert and oriented with  no focal neurologic signs.  INVESTIGATIONS:  Potassium 4.0, BUN 9, sodium 141, glucose 110, creatinine 1.2.  Electrocardiogram, 12 lead, shows him to be in normal sinus rhythm and they are all within normal limits, however, monitor strips show him clearly in atrial fibrillation.  IMPRESSION AND PLAN: 1. Atrial fibrillation with rapid ventricular response and he is in paroxysmal    atrial fibrillation.  We will put him on intravenous Cardizem and    intravenous heparin.  We will get a CT scan of his chest to rule out    pulmonary embolism as a cause of atrial fibrillation.  We will check    prothrombin time and partial thromboplastin time to see if there are any    coagulation problems.  Consider cardiology consultation.  We will admit him    to a telemetry floor. 2. History of kidney stones on potassium supplement, which is stable.  Further    recommendations will depend on patients progress. Dictated by:   Lilly Cove, M.D. Attending:  Lilly Cove, M.D. DD:  04/26/01 TD:  04/26/01 Job: [id number]

## 2010-10-26 ENCOUNTER — Emergency Department (HOSPITAL_COMMUNITY): Payer: Managed Care, Other (non HMO)

## 2010-10-26 ENCOUNTER — Emergency Department (HOSPITAL_COMMUNITY)
Admission: EM | Admit: 2010-10-26 | Discharge: 2010-10-27 | Disposition: A | Discharge status: Home / Self Care | Payer: Managed Care, Other (non HMO) | Attending: Emergency Medicine | Admitting: Emergency Medicine

## 2010-10-26 DIAGNOSIS — M542 Cervicalgia: Secondary | ICD-10-CM | POA: Insufficient documentation

## 2010-10-26 DIAGNOSIS — I4891 Unspecified atrial fibrillation: Secondary | ICD-10-CM | POA: Insufficient documentation

## 2010-10-26 DIAGNOSIS — E039 Hypothyroidism, unspecified: Secondary | ICD-10-CM | POA: Insufficient documentation

## 2010-10-26 DIAGNOSIS — Z79899 Other long term (current) drug therapy: Secondary | ICD-10-CM | POA: Insufficient documentation

## 2010-10-26 DIAGNOSIS — Z7982 Long term (current) use of aspirin: Secondary | ICD-10-CM | POA: Insufficient documentation

## 2010-10-26 DIAGNOSIS — R0602 Shortness of breath: Secondary | ICD-10-CM | POA: Insufficient documentation

## 2010-10-26 DIAGNOSIS — R42 Dizziness and giddiness: Secondary | ICD-10-CM | POA: Insufficient documentation

## 2010-10-26 DIAGNOSIS — E789 Disorder of lipoprotein metabolism, unspecified: Secondary | ICD-10-CM | POA: Insufficient documentation

## 2010-10-26 LAB — CK TOTAL AND CKMB (NOT AT ARMC)
CK, MB: 1.9 ng/mL (ref 0.3–4.0)
Relative Index: 1.5 (ref 0.0–2.5)

## 2010-10-26 LAB — POCT I-STAT, CHEM 8
Calcium, Ion: 1.13 mmol/L (ref 1.12–1.32)
Chloride: 104 mEq/L (ref 96–112)
Glucose, Bld: 110 mg/dL — ABNORMAL HIGH (ref 70–99)
HCT: 44 % (ref 39.0–52.0)
Hemoglobin: 15 g/dL (ref 13.0–17.0)
TCO2: 27 mmol/L (ref 0–100)

## 2010-10-26 LAB — DIFFERENTIAL
Basophils Absolute: 0.1 10*3/uL (ref 0.0–0.1)
Basophils Relative: 1 % (ref 0–1)
Eosinophils Absolute: 0.7 10*3/uL (ref 0.0–0.7)
Eosinophils Relative: 8 % — ABNORMAL HIGH (ref 0–5)
Lymphs Abs: 3 10*3/uL (ref 0.7–4.0)
Neutrophils Relative %: 49 % (ref 43–77)

## 2010-10-26 LAB — URINALYSIS, ROUTINE W REFLEX MICROSCOPIC
Bilirubin Urine: NEGATIVE
Hgb urine dipstick: NEGATIVE
Ketones, ur: NEGATIVE mg/dL
Specific Gravity, Urine: 1.014 (ref 1.005–1.030)
pH: 7 (ref 5.0–8.0)

## 2010-10-26 LAB — CBC
MCV: 84.6 fL (ref 78.0–100.0)
Platelets: 151 10*3/uL (ref 150–400)
RBC: 5.06 MIL/uL (ref 4.22–5.81)
RDW: 13.5 % (ref 11.5–15.5)
WBC: 8.4 10*3/uL (ref 4.0–10.5)

## 2010-10-26 LAB — GLUCOSE, CAPILLARY: Glucose-Capillary: 128 mg/dL — ABNORMAL HIGH (ref 70–99)

## 2010-10-26 LAB — BASIC METABOLIC PANEL
BUN: 13 mg/dL (ref 6–23)
Creatinine, Ser: 1.09 mg/dL (ref 0.4–1.5)
GFR calc non Af Amer: >60 mL/min (ref >60)
Glucose, Bld: 105 mg/dL — ABNORMAL HIGH (ref 70–99)
Potassium: 4 mEq/L (ref 3.5–5.1)

## 2010-10-26 LAB — D-DIMER, QUANTITATIVE: D-Dimer, Quant: 0.34 ug/mL-FEU (ref 0.00–0.48)

## 2010-10-27 ENCOUNTER — Encounter: Payer: Self-pay | Admitting: Cardiology

## 2010-10-27 ENCOUNTER — Ambulatory Visit (INDEPENDENT_AMBULATORY_CARE_PROVIDER_SITE_OTHER): Payer: Managed Care, Other (non HMO) | Admitting: Cardiology

## 2010-10-27 VITALS — BP 128/90 | HR 69 | Ht 72.0 in | Wt 182.0 lb

## 2010-10-27 DIAGNOSIS — R42 Dizziness and giddiness: Secondary | ICD-10-CM | POA: Insufficient documentation

## 2010-10-27 DIAGNOSIS — I480 Paroxysmal atrial fibrillation: Secondary | ICD-10-CM | POA: Insufficient documentation

## 2010-10-27 DIAGNOSIS — E7800 Pure hypercholesterolemia, unspecified: Secondary | ICD-10-CM

## 2010-10-27 DIAGNOSIS — I4891 Unspecified atrial fibrillation: Secondary | ICD-10-CM

## 2010-10-27 NOTE — Progress Notes (Signed)
   Marco Morris Date of Birth: Feb 09, 1956   History of Present Illness: Marco Morris is seen for followup after hospital emergency department evaluation for an episode of dizziness. He reports that over the past 2 weeks he has had some lightheadedness. 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 He went to the emergency department where he had fairly extensive evaluation including extensive blood work, cervical spine films, and a CT of the head. His ECG was normal. The symptoms are better today. He has had no significant palpitations. He rarely gets palpitations and these last less than 2 minutes of the time.  Current Outpatient Prescriptions on File Prior to Visit  Medication Sig Dispense Refill  . aspirin 81 MG tablet Take 81 mg by mouth daily.        Marland Kitchen levothyroxine (SYNTHROID, LEVOTHROID) 125 MCG tablet Take 125 mcg by mouth daily.        . metoprolol (TOPROL XL) 100 MG 24 hr tablet Take 1 tablet (100 mg total) by mouth daily.  90 tablet  3  . niacin-simvastatin (SIMCOR) 500-20 MG 24 hr tablet Take 1 tablet by mouth at bedtime.          Allergies  Allergen Reactions  . Codeine     Past Medical History  Diagnosis Date  . PAF (paroxysmal atrial fibrillation)   . Hypercholesterolemia   . History of renal calculi   . Hypothyroidism   . Dizzy     Past Surgical History  Procedure Date  . Cholecystectomy   . Kidney cyst removal     History  Smoking status  . Former Smoker -- 1.0 packs/day for 14 years  . Types: Cigarettes  . Quit date: 05/15/1985  Smokeless tobacco  . Former Neurosurgeon  . Quit date: 05/15/1985    History  Alcohol Use No    Family History  Problem Relation Age of Onset  . Heart attack Mother   . Heart attack Father   . Hypertension Brother     Review of Systems: As noted in history of present illness.  All other systems were reviewed and are negative.  Physical Exam: BP  128/90  Pulse 69  Ht 6' (1.829 m)  Wt 182 lb (82.555 kg)  BMI 24.68 kg/m2 He is a well-developed white male in no acute distress. HEENT exam is unremarkable. He has no JVD or bruits. Lungs are clear. Cardiac exam reveals a regular rate and rhythm without gallop, murmur, or click. Abdomen is soft and nontender without mass or bruits. Extremities are without edema. Pulses are 2+ and symmetric throughout. Skin is warm and dry. Neurologic exam he is alert and oriented x3. Cranial nerves II through XII are intact. He has no focal findings. LABORATORY DATA: ECG demonstrates normal sinus rhythm with a normal ECG. I reviewed his x-ray studies and blood work from the emergency department.  Assessment / Plan:

## 2010-10-27 NOTE — Assessment & Plan Note (Signed)
He has had no significant recurrent arrhythmia. We will continue with Toprol-XL 100 mg daily. Continue to avoid caffeine.

## 2010-10-27 NOTE — Assessment & Plan Note (Signed)
He is now on Simcor. Blood work is followed by his primary care physician.

## 2010-10-27 NOTE — Assessment & Plan Note (Signed)
The etiology of this lightheadedness is unclear. His symptoms do not really suggest true vertigo. There is no associated arrhythmia. His evaluation including CT of the head was unremarkable. I recommended we take a wait and see approach. If his symptoms should recur or worsen then we can consider further evaluation based on his history. I have okayed him to return to work without restriction.

## 2010-10-27 NOTE — Patient Instructions (Signed)
Call me if you continue to have symptoms or if they progress.  I will plan on seeing you back in 6 months.

## 2010-10-28 ENCOUNTER — Encounter: Payer: Self-pay | Admitting: Cardiology

## 2011-02-20 ENCOUNTER — Encounter: Payer: Self-pay | Admitting: Cardiology

## 2011-04-05 IMAGING — CT CT ABDOMEN W/O CM
2 of 4 series · 17 of 46 positions shown, 19 images · non-contrast
Comparison: None

CT ABDOMEN

CLINICAL DATA: Left flank pain.  History stones.

CT OF THE ABDOMEN AND PELVIS WITHOUT CONTRAST (CT UROGRAM)
TECHNIQUE: Multidetector CT imaging was performed through the
abdomen and pelvis to include the urinary tract.

[Series 2: stone_wo 5.0 b40f st · axial · 0.78mm/px · z∈[-150,+265]mm · 14 of 91 slices shown, 16 images]
[im 4/91  soft-tissue]
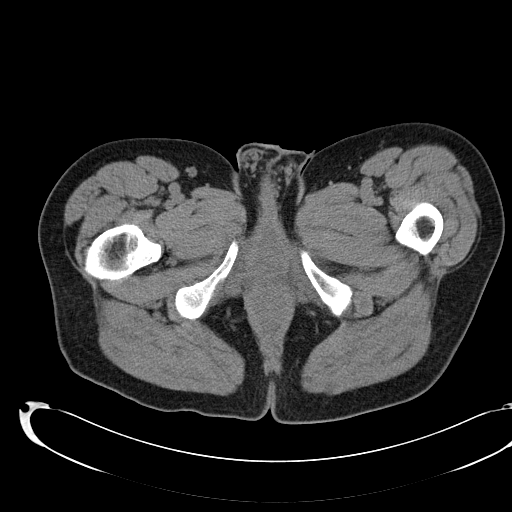
[im 4/91  bone]
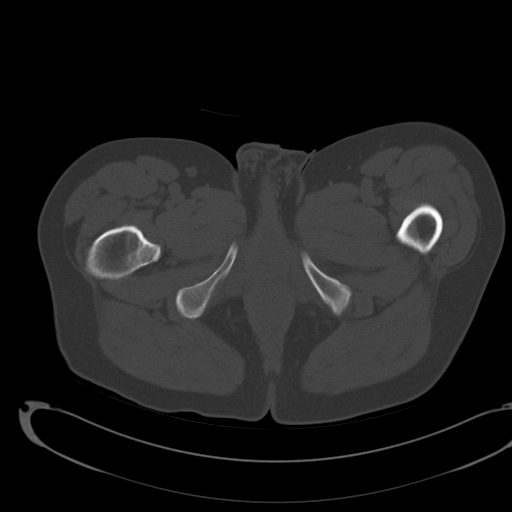
[im 11/91  soft-tissue]
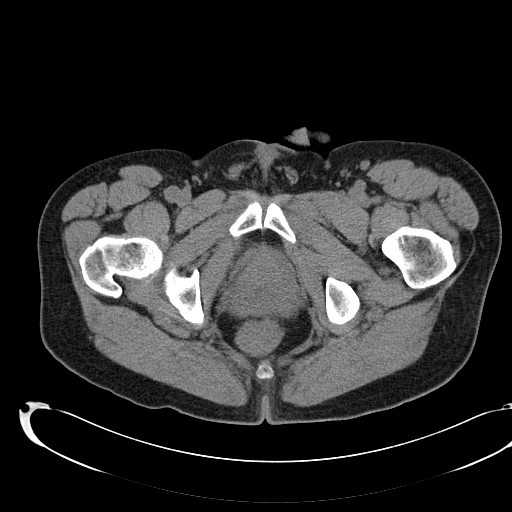
[im 17/91  soft-tissue]
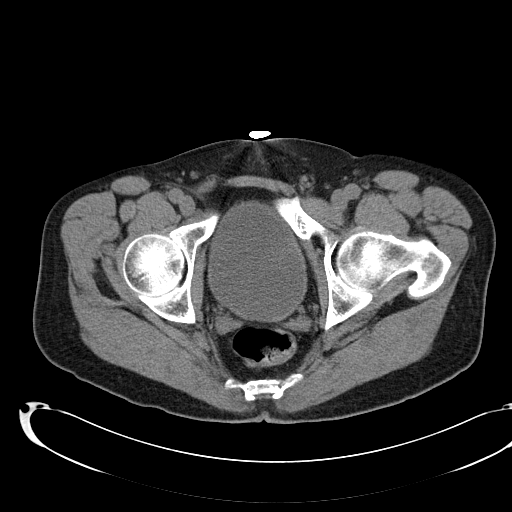
[im 24/91  soft-tissue]
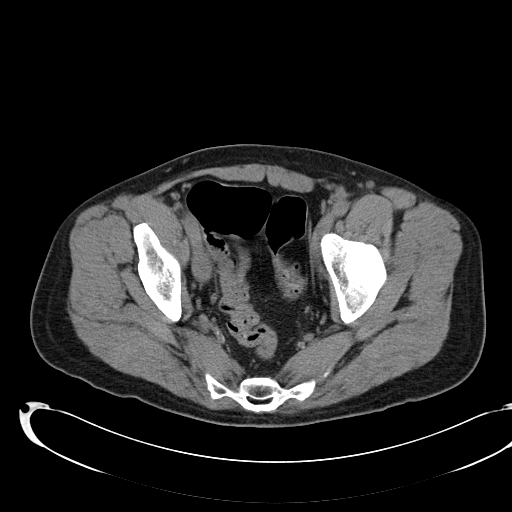
[im 31/91  soft-tissue]
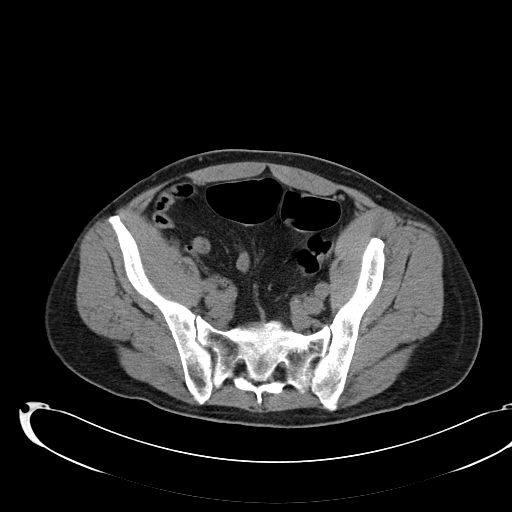
[im 37/91  soft-tissue]
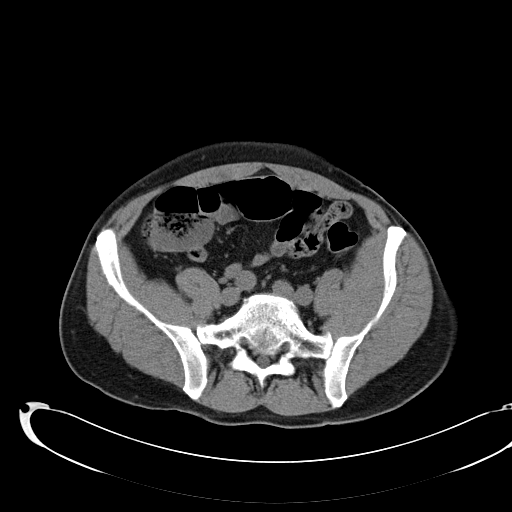
[im 44/91  soft-tissue]
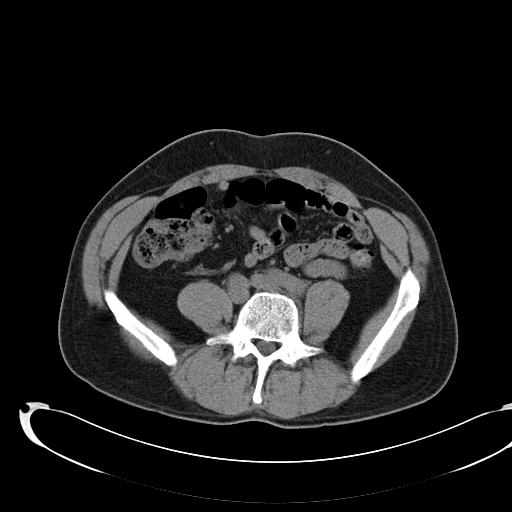
[im 47/91  soft-tissue]
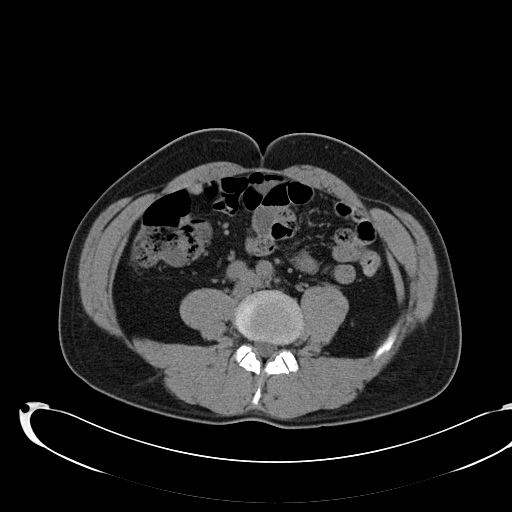
[im 54/91  soft-tissue]
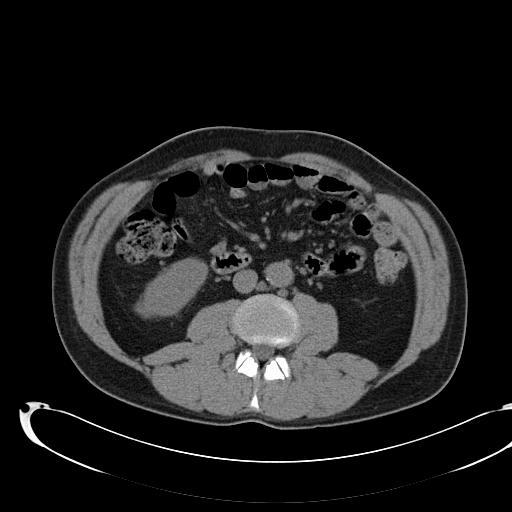
[im 54/91  bone]
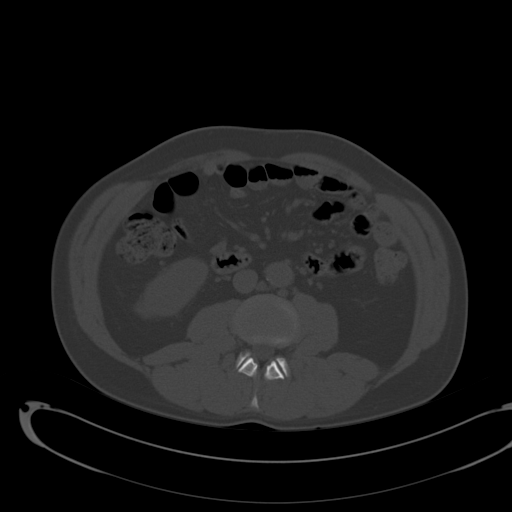
[im 61/91  soft-tissue]
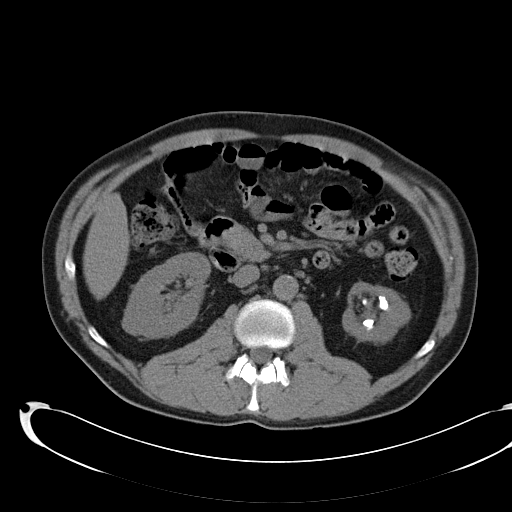
[im 67/91  soft-tissue]
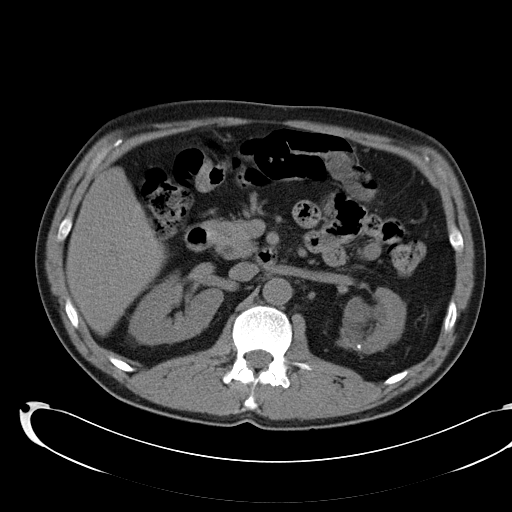
[im 74/91  soft-tissue]
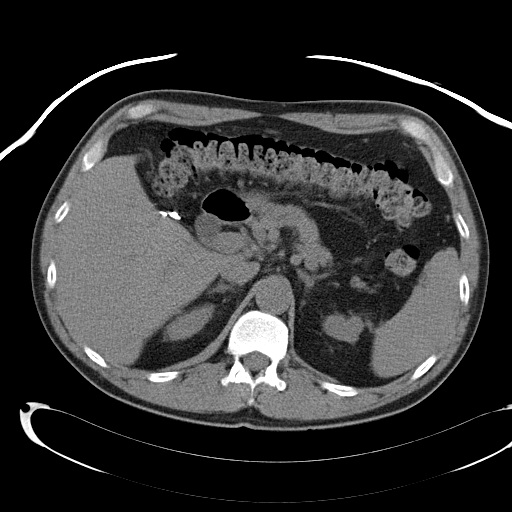
[im 81/91  soft-tissue]
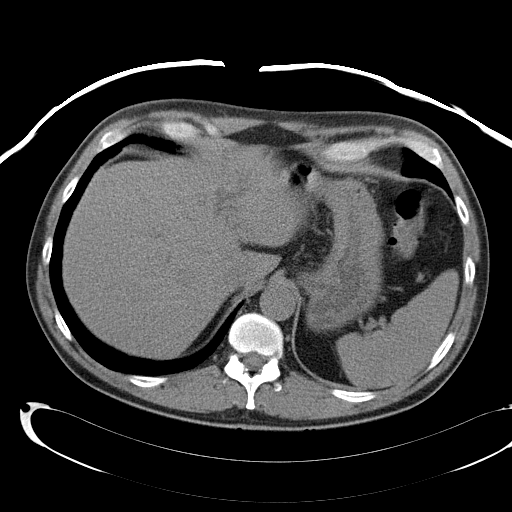
[im 87/91  soft-tissue]
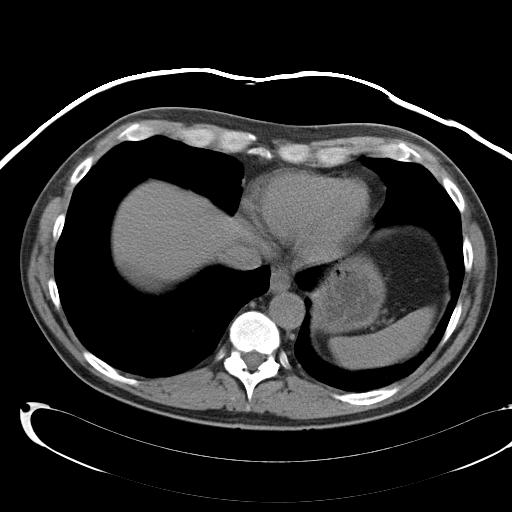

[Series 602: coronal · coronal · 0.92mm/px · 3 of 77 slices shown]
[im 26/77  soft-tissue]
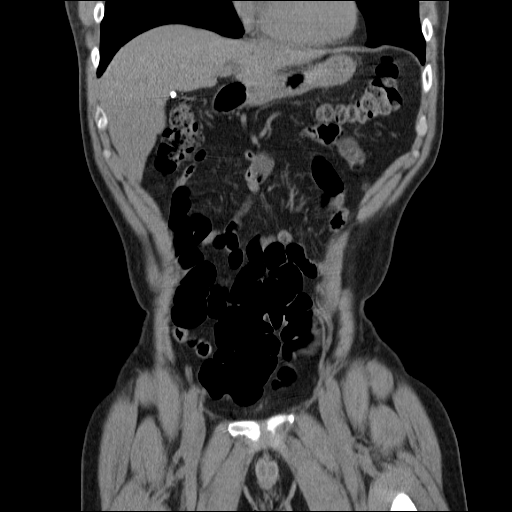
[im 34/77  soft-tissue]
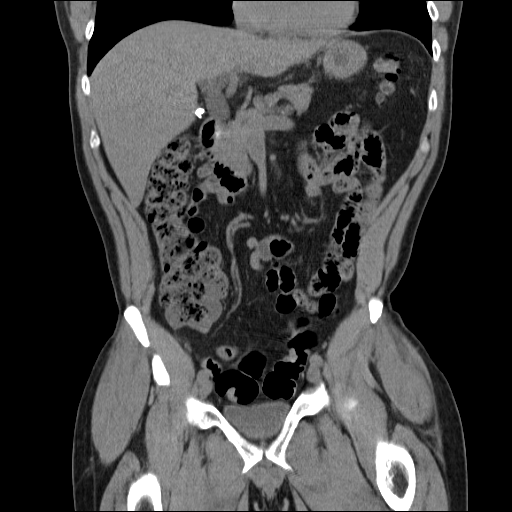
[im 43/77  soft-tissue]
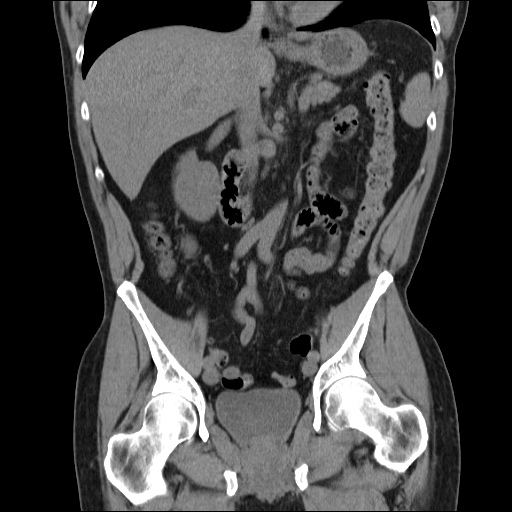

[17 of 46 positions shown; findings below may reference images not displayed]

FINDINGS: Marked degree of cortical scarring/atrophy of the left
kidney.  Ectasia of portions of the left collecting system.
Multiple calculi within the left collecting system, the largest in
the lower pole measuring 12 mm in diameter.  2 mm calculus in the
right lower pole.  No findings to suggest acute urinary tract
obstruction.

Negative liver, spleen, pancreas, and adrenal glands.  There is
some compensatory hypertrophy of the right kidney.
IMPRESSION: Multiple renal calculi mainly involving the left kidney. Findings
compatible with postobstructive atrophy and chronic atrophic
pyelonephritis of the left kidney.  No definite acute findings.

CT PELVIS
FINDINGS: Prostate gland is enlarged.  Mild diffuse hypertrophy of
the bladder wall.  Seminal vesicles unremarkable.  No lower urinary
tract calculi.
IMPRESSION: Prostate enlargement.  No acute pelvic findings.

## 2011-04-07 IMAGING — CR DG CHEST 2V
2 series · 2 of 2 positions shown · non-contrast
Comparison: None

CLINICAL DATA: Mid chest pain, history of atrial fibrillation

CHEST - 2 VIEW

[w chest pa]
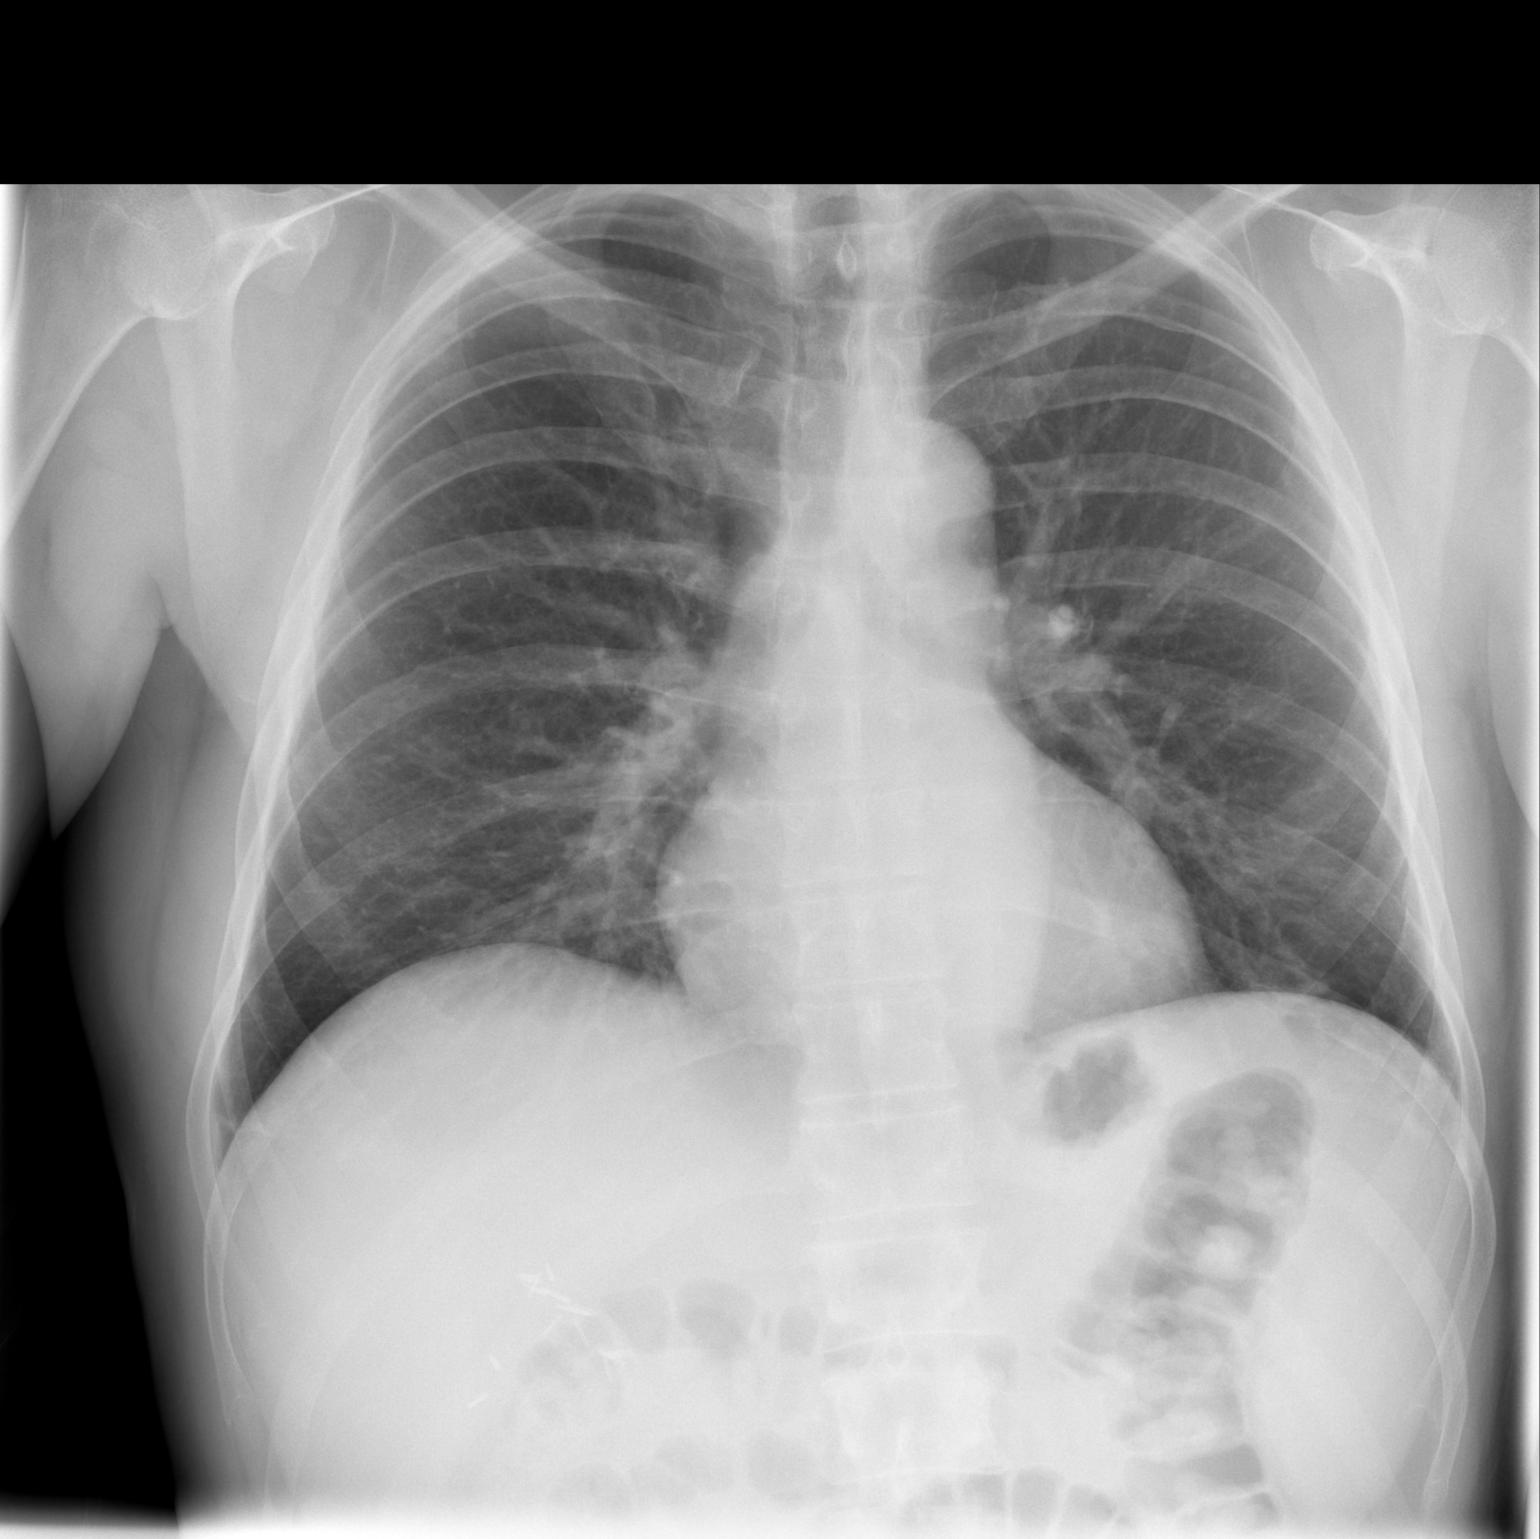

[w chest lat]
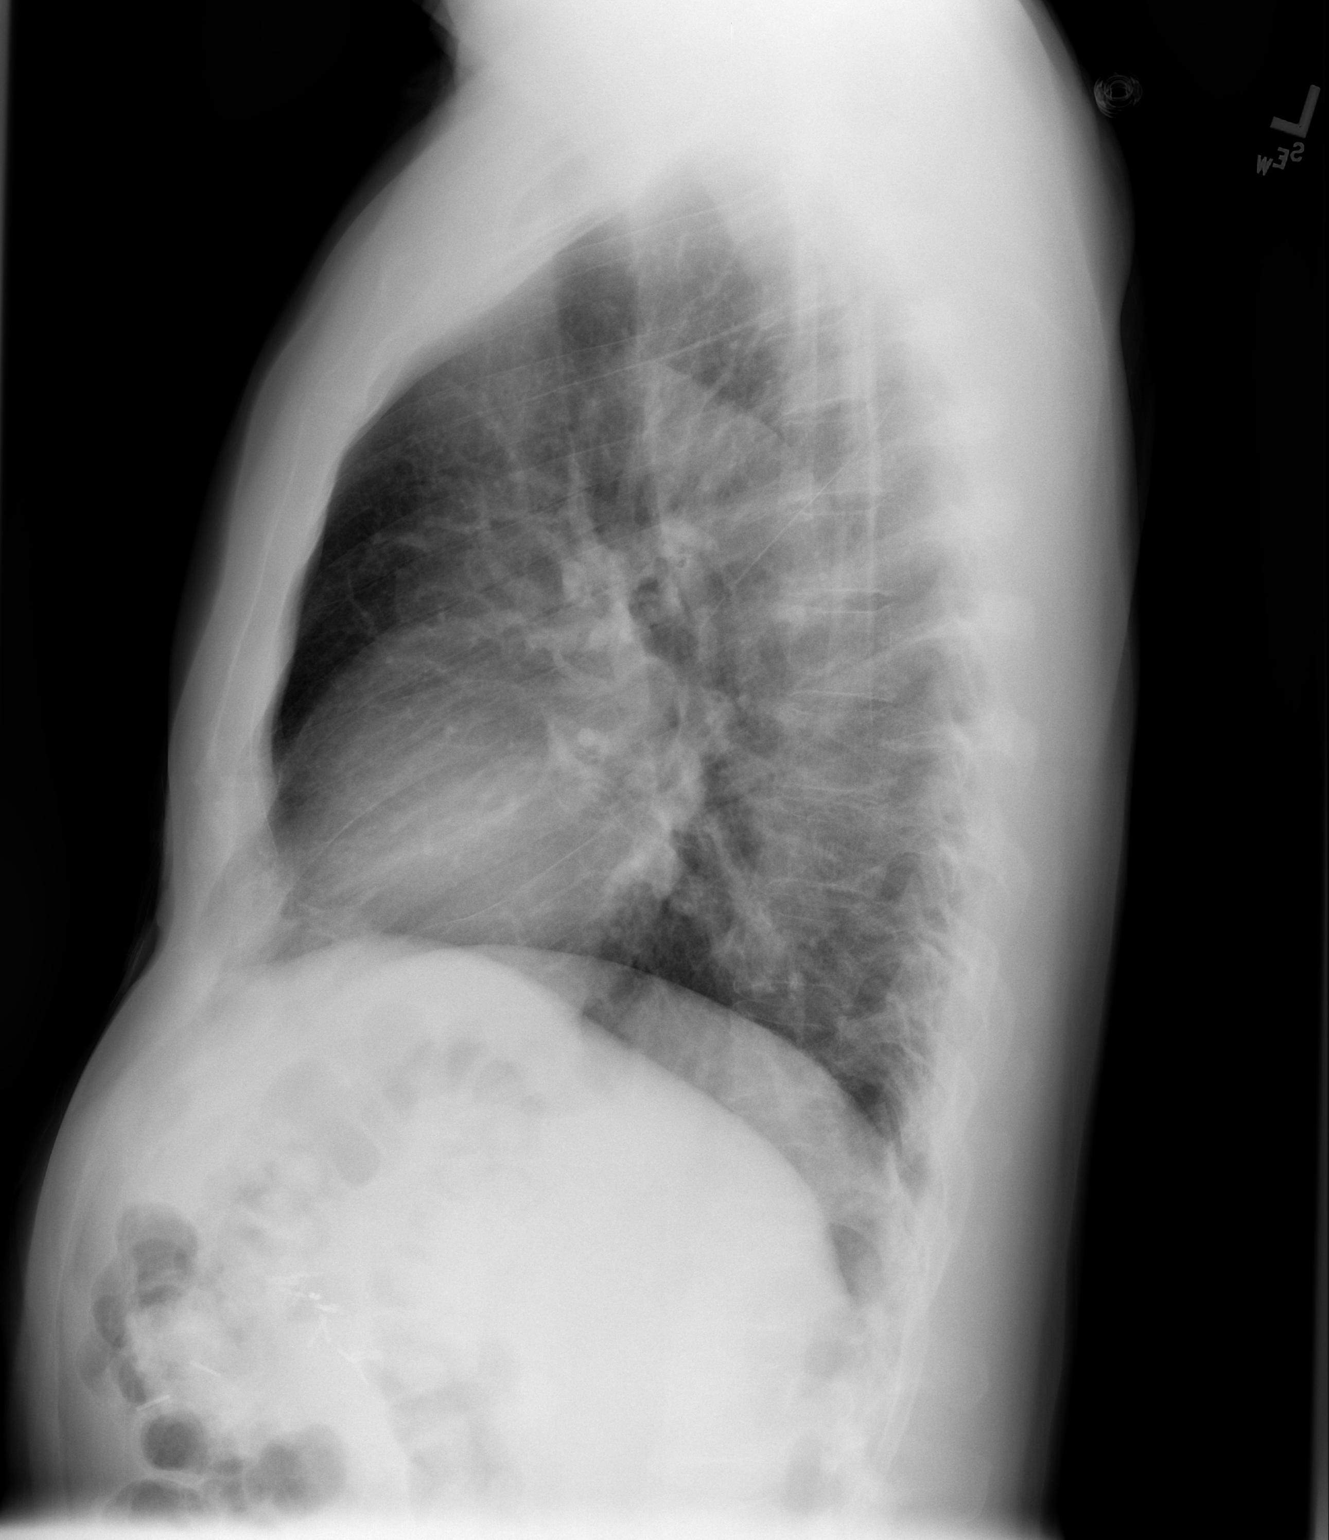

[2 of 2 positions shown; findings below may reference images not displayed]

FINDINGS: The lungs are clear.  The heart is within normal limits
in size.  No bony abnormality is seen.
IMPRESSION: No active lung disease.

## 2011-08-16 ENCOUNTER — Encounter: Payer: Self-pay | Admitting: Cardiology

## 2012-02-19 ENCOUNTER — Encounter: Payer: Self-pay | Admitting: Cardiology

## 2012-03-25 ENCOUNTER — Ambulatory Visit (INDEPENDENT_AMBULATORY_CARE_PROVIDER_SITE_OTHER): Payer: Managed Care, Other (non HMO) | Admitting: Cardiology

## 2012-03-25 ENCOUNTER — Encounter: Payer: Self-pay | Admitting: Cardiology

## 2012-03-25 VITALS — BP 132/90 | HR 68 | Ht 73.0 in | Wt 189.0 lb

## 2012-03-25 DIAGNOSIS — I480 Paroxysmal atrial fibrillation: Secondary | ICD-10-CM

## 2012-03-25 DIAGNOSIS — I4891 Unspecified atrial fibrillation: Secondary | ICD-10-CM

## 2012-03-25 DIAGNOSIS — E7800 Pure hypercholesterolemia, unspecified: Secondary | ICD-10-CM

## 2012-03-25 MED ORDER — DILTIAZEM HCL ER COATED BEADS 240 MG PO CP24: 240.0000 mg | ORAL_CAPSULE | Freq: Every day | ORAL | Status: DC

## 2012-03-25 NOTE — Patient Instructions (Signed)
Start diltiazem CD 240 mg daily   Reduce Toprol XL to 50 mg daily for 5 days then 25 mg daily for 5 days then stop.  If your rhythm acts up more we could go back to Toprol but hopefully this will help with your fatigue

## 2012-03-25 NOTE — Progress Notes (Signed)
   Marco Morris Date of Birth: [birth year]   History of Present Illness: Marco Morris is seen for fibrillation dating back to 2003. This has been managed with beta blocker therapy. He states he has been doing well but does complain of excessive fatigue. He thinks this may be related to his metoprolol. He still has infrequent symptoms of palpitations that last less than 1-2 minutes. Sometimes he can go 1 or 2 months without any palpitations and then he may have palpitations daily. He has mild shortness of breath and lightheadedness.  Current Outpatient Prescriptions on File Prior to Visit  Medication Sig Dispense Refill  . aspirin 81 MG tablet Take 81 mg by mouth daily.        Marland Kitchen levothyroxine (SYNTHROID, LEVOTHROID) 125 MCG tablet Take 125 mcg by mouth daily.        . metoprolol (TOPROL XL) 100 MG 24 hr tablet Take 1 tablet (100 mg total) by mouth daily.  90 tablet  3  . niacin-simvastatin (SIMCOR) 500-20 MG 24 hr tablet Take 1 tablet by mouth at bedtime.        Marland Kitchen diltiazem (CARDIZEM CD) 240 MG 24 hr capsule Take 1 capsule (240 mg total) by mouth daily.  30 capsule  11    Allergies  Allergen Reactions  . Codeine     Past Medical History  Diagnosis Date  . PAF (paroxysmal atrial fibrillation)   . Hypercholesterolemia   . History of renal calculi   . Hypothyroidism   . Dizzy     Past Surgical History  Procedure Date  . Cholecystectomy   . Kidney cyst removal     History  Smoking status  . Former Smoker -- 1.0 packs/day for 14 years  . Types: Cigarettes  . Quit date: 05/15/1985  Smokeless tobacco  . Former Neurosurgeon  . Quit date: 05/15/1985    History  Alcohol Use No    Family History  Problem Relation Age of Onset  . Heart attack Mother   . Heart attack Father   . Hypertension Brother     Review of Systems: As noted in history of present illness.  All other systems were reviewed and are negative.  Physical Exam: BP 132/90  Pulse 68  Ht 6\' 1"  (1.854 m)  Wt 189 lb  (85.73 kg)  BMI 24.94 kg/m2 He is a well-developed white male in no acute distress. HEENT exam is unremarkable. He has no JVD or bruits. Lungs are clear. Cardiac exam reveals a regular rate and rhythm without gallop, murmur, or click. Abdomen is soft and nontender without mass or bruits. Extremities are without edema. Pulses are 2+ and symmetric throughout. Skin is warm and dry. Neurologic exam he is alert and oriented x3. Cranial nerves II through XII are intact. He has no focal findings. LABORATORY DATA: ECG demonstrates normal sinus rhythm with a normal ECG.   Assessment / Plan: 1. Paroxysmal atrial fibrillation. This has been well controlled on metoprolol but I do suspect he is having significant fatigue related to this therapy. I recommended a trial of Cardizem instead. We will start him on 240 mg daily. We will taper off his metoprolol. If he notes a significant increase in his arrhythmia we may need to go back to metoprolol but hopefully this will help with his symptoms of fatigue.  2. Hypercholesterolemia. His most recent lipid panel one month ago showed a total cholesterol 167, triglycerides 108, HDL 50, and LDL of 95. We will continue with Simcor.

## 2013-01-30 ENCOUNTER — Encounter: Payer: Self-pay | Admitting: Neurology

## 2013-01-30 ENCOUNTER — Ambulatory Visit (INDEPENDENT_AMBULATORY_CARE_PROVIDER_SITE_OTHER): Payer: Managed Care, Other (non HMO) | Admitting: Neurology

## 2013-01-30 VITALS — BP 142/83 | HR 73 | Ht 73.0 in | Wt 186.0 lb

## 2013-01-30 DIAGNOSIS — G609 Hereditary and idiopathic neuropathy, unspecified: Secondary | ICD-10-CM

## 2013-01-30 NOTE — Progress Notes (Signed)
[facility]  Provider:  Dr Hosie Poisson Referring Provider: Ralene Ok, MD Primary Care Physician:  Ralene Ok, MD  CC:  Neuropathy  HPI:  Marco Morris is a 57 y.o. male here as a referral from Dr. Ludwig Clarks for evaluation of neuropathy.  Having pain and tingling in left ankle, started around 2 months ago. Runs along lateral surface from lower shin to sole of foot on the lateral side. Described as constant loss of sensation with occasional stabbing/burning sensation lasting few minutes 2-3 times a day. No triggering factors. No alleviating factors. No symptoms elsewhere. No distal radiating features, occasional radiates upwards. Has not tried any medications.  Takes synthroid daily. Notes he has elevated blood sugars but no formal diagnosis of DM. Has family history of DM. No family history of any neuropathic problems, no family history neurodegenerative process.  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 No evidence of myopathy. Blood done recently was unremarkable with exception of B12 level of 299.   Review of Systems: Out of a complete 14 system review, the patient complains of only the following symptoms, and all other reviewed systems are negative. Positive for blurred vision easy bruising cough scarring palpitations swelling in legs hair loss ringing in ears urinary problems joint swelling none of sleep dizziness numbness weakness  History   Social History  . Marital Status: Married    Spouse Name: [name]    Number of Children: 2  . Years of Education: [name]   Occupational History  . DRIVER Marco Morris [employer]   Social History Main Topics  . Smoking status: Former Smoker -- 1.00 packs/day for 14 years    Types: Cigarettes    Quit date: 05/15/1985  . Smokeless tobacco: Former Neurosurgeon    Quit date: 05/15/1985  . Alcohol Use: Yes  . Drug Use: No  . Sexual Activity: Not on file   Other Topics Concern  . Not on  file   Social History Narrative  . No narrative on file    Family History  Problem Relation Age of Onset  . Heart attack Mother   . Stroke Mother   . Diabetes Mother   . Cancer - Ovarian Mother   . Leukemia Mother   . Heart attack Father   . Stroke Father   . Diabetes Father   . Cancer - Other Father   . Hypertension Brother     Past Medical History  Diagnosis Date  . PAF (paroxysmal atrial fibrillation)   . Hypercholesterolemia   . History of renal calculi   . Hypothyroidism   . Dizzy     Past Surgical History  Procedure Laterality Date  . Cholecystectomy    . Kidney cyst removal      Current Outpatient Prescriptions  Medication Sig Dispense Refill  . aspirin 81 MG tablet Take 81 mg by mouth daily.        Marland Kitchen atorvastatin (LIPITOR) 20 MG tablet Take 20 mg by mouth daily.      Marland Kitchen diltiazem (CARDIZEM CD) 240 MG 24 hr capsule Take 240 mg by mouth daily.      Marland Kitchen levothyroxine (SYNTHROID, LEVOTHROID) 125 MCG tablet Take 125 mcg by mouth daily.         No current facility-administered medications for this visit.    Allergies as of 01/30/2013 - Review Complete 01/30/2013  Allergen Reaction Noted  . Codeine  10/27/2010    Vitals: BP 142/83  Pulse 73  Ht 6\' 1"  (1.854  m)  Wt 186 lb (84.369 kg)  BMI 24.55 kg/m2 Last Weight:  Wt Readings from Last 1 Encounters:  01/30/13 186 lb (84.369 kg)   Last Height:   Ht Readings from Last 1 Encounters:  01/30/13 6\' 1"  (1.854 m)     Physical exam: Exam: Gen: NAD, conversant Eyes: anicteric sclerae, moist conjunctivae HENT: Atraumatic Neck: Trachea midline; supple,  Lungs: CTA, no wheezing, rales, rhonic                          CV: RRR, no MRG Abdomen: Soft, non-tender;  Extremities: No peripheral edema  Skin: Normal temperature, no rash,  Psych: Appropriate affect, pleasant  Neuro: MS: AA&Ox3, appropriately interactive, normal affect   Speech: fluent w/o paraphasic error  Memory: good recent and remote  recall  CN: PERRL, EOMI no nystagmus, no ptosis, sensation intact to LT V1-V3 bilat, face symmetric, no weakness, hearing grossly intact, palate elevates symmetrically, shoulder shrug 5/5 bilat,  tongue protrudes midline, no fasiculations noted.  Motor: normal bulk and tone Strength: 5/5  In all extremities  Coord: rapid alternating and point-to-point (FNF, HTS) movements intact.  Reflexes: symmetrical, bilat downgoing toes  Sens: decreased LT, PP, temp in area of lateral LLE from distal shin, to lateral ankle and sole of foot. Mild bilateral LE decreased vibration to knee, normal proprioception  Gait: posture, stance, stride and arm-swing normal.   Assessment:  After physical and neurologic examination, review of laboratory studies, imaging, neurophysiology testing and pre-existing records, assessment will be reviewed on the problem list.  Plan:  Treatment plan and additional workup will be reviewed under Problem List.  1)peripheral neuropathy  Marco Morris is a pleasant 57y/o gentleman presenting for initial evaluation of neuropathy. 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 Unclear etiology of his symptoms, suspect are likely multifactorial. Patient does have borderline to low B12 level, will check MMA and consider supplementation. We'll check HbA1c. I will suspicion this represents any congenital disorders such as Charcot-Marie-Tooth. Pending lab results would consider medication options, such as Lyrica, gabapentin or topical cream such as capsaicin cream. Will call patient to discuss lab results when available.

## 2013-01-30 NOTE — Patient Instructions (Addendum)
Overall you are doing fairly well but I do want to suggest a few things today:   Remember to drink plenty of fluid, eat healthy meals and do not skip any meals. Try to eat protein with a every meal and eat a healthy snack such as fruit or nuts in between meals. Try to keep a regular sleep-wake schedule and try to exercise daily, particularly in the form of walking, 20-30 minutes a day, if you can.   As far as diagnostic testing I would like you to get some blood work done to check for causes of neuropathy  I would like to see you back once the blood work is completed, sooner if we need to. Please call us with any interim questions, concerns, problems, updates or refill requests.   Please also call us for any test results so we can go over those with you on the phone.  My clinical assistant and will answer any of your questions and relay your messages to me and also relay most of my messages to you.   Our phone number is [phone]. We also have an after hours call service for urgent matters and there is a physician on-call for urgent questions. For any emergencies you know to call 911 or go to the nearest emergency room

## 2013-02-05 ENCOUNTER — Encounter: Payer: Self-pay | Admitting: Neurology

## 2013-02-05 LAB — VITAMIN B1, WHOLE BLOOD: Thiamine: 133 nmol/L (ref 66.5–200.0)

## 2013-02-05 LAB — HGB A1C W/O EAG: Hgb A1c MFr Bld: 5.8 % — ABNORMAL HIGH (ref 4.8–5.6)

## 2013-02-07 ENCOUNTER — Telehealth: Payer: Self-pay | Admitting: *Deleted

## 2013-02-07 NOTE — Telephone Encounter (Signed)
Called patient back, discussed results with patients wife. They will take oral B12 and control blood sugars. Follow up in 3-4 months or earlier if needed.

## 2013-02-07 NOTE — Telephone Encounter (Signed)
We need the lab results.  Please call back this number [phone]

## 2013-02-07 NOTE — Telephone Encounter (Signed)
Spouse requesting lab results.

## 2013-02-18 ENCOUNTER — Telehealth: Payer: Self-pay | Admitting: Neurology

## 2013-02-19 NOTE — Telephone Encounter (Signed)
Call pt about the B-12 that he had questions about. Spoke with Dr. Hosie Poisson and he stated that the pt's lab work was normal and the pt did not need to take B-12 at this time. Spoke with the pt's wife Erskine Squibb. She verbalized understanding.

## 2013-03-04 ENCOUNTER — Other Ambulatory Visit: Payer: Self-pay | Admitting: Urology

## 2013-03-11 ENCOUNTER — Encounter (HOSPITAL_COMMUNITY): Payer: Self-pay | Admitting: Pharmacy Technician

## 2013-03-25 ENCOUNTER — Encounter (HOSPITAL_COMMUNITY): Payer: Self-pay | Admitting: *Deleted

## 2013-03-25 NOTE — Progress Notes (Signed)
Patient states that Dr. Isabel Caprice is aware that he has shortness of breath (at rest), numbness in his left foot and is prone to form blood clots.

## 2013-03-27 ENCOUNTER — Ambulatory Visit (HOSPITAL_COMMUNITY)
Admission: RE | Admit: 2013-03-27 | Discharge: 2013-03-27 | Disposition: A | Discharge status: Home / Self Care | Payer: Managed Care, Other (non HMO) | Source: Ambulatory Visit | Attending: Urology | Admitting: Urology

## 2013-03-27 DIAGNOSIS — Z01810 Encounter for preprocedural cardiovascular examination: Secondary | ICD-10-CM | POA: Insufficient documentation

## 2013-03-27 DIAGNOSIS — N200 Calculus of kidney: Secondary | ICD-10-CM | POA: Insufficient documentation

## 2013-03-28 ENCOUNTER — Telehealth: Payer: Self-pay | Admitting: Cardiology

## 2013-03-28 NOTE — Telephone Encounter (Signed)
Returned call to patient's wife she stated patient is having lithotripsy on Monday 03/31/13 and was told to hold aspirin x 3 days,wanted Dr.Jordan to know.Message sent to Dr.Jordan for review.

## 2013-03-28 NOTE — Telephone Encounter (Signed)
New Problem  Pt wife called// he is having a procedure completed on Monday. Will be off of Asprin for 3 days. Wanted the office of Dr. [place] to be aware.

## 2013-03-28 NOTE — Progress Notes (Signed)
Received a phone call from patient's wife requesting if it was" OK to take anything else besides Aspirin for his headache since he was scheduled for a ESWL on 03/31/13" Reminded her of the list of medications to avoid in the blue folder from [facility] but " it was OK to take Tylenol for a headache' She also asked" if it was OK to be off the aspirin because he takes the 81 mg for his A fib" I encouraged her to call his cardiologist about this question but the [facility] requirement was for the patient to be off all aspirin products for 72 hours prior to procedure. She verbalized understanding and said she would call his cardiologist to just inform them that he would be stopping the aspirin for his ESWL

## 2013-03-28 NOTE — H&P (Signed)
History of Present Illness   Marco Morris presents today to reestablish as a new patient. I have not see him for approximately 4 years. He is currently 57 years of age. His situation is fairly complicated. In the past, he has silent obstruction of the left kidney, which required treatment. Eventually he was noted to have poor function of the left kidney and his overall renal function has been noted to be approximately 75% on the right and 25% on the left. The patient did end up having a percutaneous nephrolithotomy to deal with the stones. He subsequently has been known to have residual fragments in the lower pole, but we felt that ongoing treatment really was not indicated if he was not having significant ongoing obstruction. He has also had some prior bladder neck obstructive symptoms, but nothing that has been particularly problematic. We have seen him for mild erectile dysfunction as well. Again, he was last seen in 2010 and at that point we felt that we would continue to see him intermittently, but we have not seen him since that time.   In the interim, he has had some other medical issues, which we discussed today. He most recently has been complaining of some bilateral lower extremity numbness. The etiology of this is not entirely clear, but obviously it is not likely to be urologic. There has been some question about mild diabetes mellitus based on family history and a slightly elevated hemoglobin A1c. He has been to several physicians to sort that out. From a voiding standpoint, he does have moderate nocturia of 2-3 times per evening. Some frequency and urgency, but no real significant obstructive symptoms. He is uncertain about any recent PSA testing. It has been extremely low in the past but has not been checked her for 5-6 years. He does have some intermittent nonspecific left-sided back pain, unclear if it is related to his stone/fragment. He denies any recent imaging studies.    Past Medical  History Problems  1. History of  Atrial Fibrillation 427.31 2. 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 History of  Hydronephrosis On The Left 591 4. History of  Hypercholesterolemia 272.0 5. History of  Hypothyroidism 244.9  Surgical History Problems  1. History of  Cholecystectomy 2. History of  Percutaneous Lithotomy  Current Meds 1. Aspirin 81 MG Oral Tablet; Therapy: (Recorded:26Feb2008) to 2. Atorvastatin Calcium 20 MG Oral Tablet; Therapy: 13Aug2014 to 3. Cartia XT 240 MG Oral Capsule Extended Release 24 Hour; Therapy: 14Aug2014 to 4. DULoxetine HCl 30 MG Oral Capsule Delayed Release Particles; Therapy: 18Oct2014 to 5. Levothyroxine Sodium 125 MCG Oral Tablet; Therapy: 14Aug2014 to  Allergies Medication  1. No Known Drug Allergies  Family History Problems  1. Maternal history of  Absence Of One Kidney 2. Maternal history of  Cancer 3. Paternal history of  Diabetes Mellitus V18.0 4. Maternal history of  Diabetes Mellitus V18.0 5. Paternal history of  Family Health Status - Father's Age 47yrs 6. Maternal history of  Family Health Status - Mother's Age 26yrs 7. Family history of  Family Health Status Number Of Children 1 son 1 daughter 10. Paternal history of  Heart Disease V17.49 9. Maternal history of  Heart Disease V17.49 10. Paternal history of  Hypercholesterolemia 11. Maternal history of  Hypercholesterolemia 12. Paternal history of  Hypertension V17.49 13. Maternal history of  Hypertension V17.49 14. Fraternal history of  Nephrolithiasis  Social History Problems    Alcohol Use   Marital History - Currently Married  Occupation: truck Hospital doctor   Tobacco Use Z61.09 1 pack, 10 years Denied    Caffeine Use  Review of Systems Genitourinary, constitutional, skin, eye, otolaryngeal, hematologic/lymphatic, cardiovascular, pulmonary, endocrine, musculoskeletal, gastrointestinal, neurological and psychiatric system(s) were reviewed and  pertinent findings if present are noted.  Genitourinary: nocturia.  Respiratory: shortness of breath.    Vitals Vital Signs [Data Includes: Last 1 Day]  20Oct2014 02:48PM  BMI Calculated: 26.04 Height: 5 ft 11 in Weight: 186 lb  Blood Pressure: 162 / 100 Temperature: 98.5 F Heart Rate: 88  Physical Exam Constitutional: Well nourished and well developed . No acute distress.  Neck: The appearance of the neck is normal and no neck mass is present.  Pulmonary: No respiratory distress and normal respiratory rhythm and effort.  Cardiovascular: Heart rate and rhythm are normal . No peripheral edema.  Abdomen: The abdomen is soft and nontender. No masses are palpated. No CVA tenderness. No hernias are palpable. No hepatosplenomegaly noted.  Rectal: Rectal exam demonstrates normal sphincter tone, no tenderness and no masses. Estimated prostate size is 1+. The prostate has no nodularity and is not tender. The left seminal vesicle is nonpalpable. The right seminal vesicle is nonpalpable. The perineum is normal on inspection.  Genitourinary: Examination of the penis demonstrates no discharge, no masses, no lesions and a normal meatus. The scrotum is without lesions. The right epididymis is palpably normal and non-tender. The left epididymis is palpably normal and non-tender. The right testis is non-tender and without masses. The left testis is non-tender and without masses.  Skin: Normal skin turgor, no visible rash and no visible skin lesions.  Neuro/Psych:. Mood and affect are appropriate.    Assessment Assessed  1. Nephrolithiasis 592.0 2. Renal Atrophy 587 3. Hydronephrosis 591 4. Renal Colic 788.0  Plan  Renal Colic (788.0)  1. Follow-up Schedule Surgery Office  Follow-up  Done: 20Oct2014  Discussion/Summary   There are several issues that we discussed with Marco Morris today. Overall, his voiding symptoms are not terribly bothersome to him. His biggest complaints are frequency, urgency, and  nocturia rather than obstructive symptoms. We discussed the possibility of trying of some empiric medical therapy, but he does not feel like his symptoms are bad enough to warrant that. He is due for a BMET check as well as a PSA.  On KUB imaging today, he has stable stone burden in the lower pole of the left kidney. A number of large stones unchanged over the last 5-6 years in size and location. There is really no indication to treat those. I did, however, suspect a faint 7-8 mm calcification overlying the right kidney. For that reason, we went ahead with stone protocol CT. The official report remains pending, but I can confirm that he does have a 7-8 mm stone in that right renal unit. It is currently nonobstructing. There appears to be a smaller stone as well. Given the fact that this really is his only well-functioning kidney, I do feel that that stone ought to be considered for elective ESWL. He has had that procedure in the past. We discussed success rates. I do think there ought to be about an 85% chance that that stone will be treated successfully. This is nonurgent but we will try to set him up for a time in the next several weeks for ESWL on that side. He is told that the one problem could be visualization of the stone, which would then require cancellation of the procedure and consideration for other options, which  could include ongoing observation versus going ahead with ureteroscopy in that setting.      Signatures Electronically signed by : Barron Alvine, M.D.; Mar 04 2013  8:58AM

## 2013-03-31 ENCOUNTER — Encounter (HOSPITAL_COMMUNITY)
Admission: RE | Disposition: A | Discharge status: Home / Self Care | Payer: Self-pay | Source: Ambulatory Visit | Attending: Urology

## 2013-03-31 ENCOUNTER — Ambulatory Visit (HOSPITAL_COMMUNITY): Payer: Managed Care, Other (non HMO)

## 2013-03-31 ENCOUNTER — Encounter (HOSPITAL_COMMUNITY): Payer: Self-pay | Admitting: *Deleted

## 2013-03-31 ENCOUNTER — Ambulatory Visit (HOSPITAL_COMMUNITY)
Admission: RE | Admit: 2013-03-31 | Discharge: 2013-03-31 | Disposition: A | Discharge status: Home / Self Care | Payer: Managed Care, Other (non HMO) | Source: Ambulatory Visit | Attending: Urology | Admitting: Urology

## 2013-03-31 DIAGNOSIS — N269 Renal sclerosis, unspecified: Secondary | ICD-10-CM | POA: Insufficient documentation

## 2013-03-31 DIAGNOSIS — N1330 Unspecified hydronephrosis: Secondary | ICD-10-CM | POA: Insufficient documentation

## 2013-03-31 DIAGNOSIS — N200 Calculus of kidney: Secondary | ICD-10-CM

## 2013-03-31 HISTORY — DX: Chronic kidney disease, unspecified: N18.9

## 2013-03-31 HISTORY — DX: Myoneural disorder, unspecified: G70.9

## 2013-03-31 HISTORY — DX: Disease of blood and blood-forming organs, unspecified: D75.9

## 2013-03-31 SURGERY — LITHOTRIPSY, ESWL
Anesthesia: LOCAL | Laterality: Right

## 2013-03-31 MED ORDER — DEXTROSE-NACL 5-0.45 % IV SOLN
INTRAVENOUS | Status: DC
  Administered 2013-03-31: 09:00:00 via INTRAVENOUS

## 2013-03-31 MED ORDER — HYDROCODONE-ACETAMINOPHEN 5-325 MG PO TABS: 1.0000 | ORAL_TABLET | Freq: Four times a day (QID) | ORAL | Status: DC | PRN

## 2013-03-31 MED ORDER — CIPROFLOXACIN HCL 500 MG PO TABS
500.0000 mg | ORAL_TABLET | ORAL | Status: AC
  Administered 2013-03-31: 500 mg via ORAL
  Filled 2013-03-31: qty 1

## 2013-03-31 MED ORDER — DIPHENHYDRAMINE HCL 25 MG PO CAPS
25.0000 mg | ORAL_CAPSULE | ORAL | Status: AC
  Administered 2013-03-31: 25 mg via ORAL
  Filled 2013-03-31: qty 1

## 2013-03-31 MED ORDER — DIAZEPAM 5 MG PO TABS
10.0000 mg | ORAL_TABLET | ORAL | Status: AC
  Administered 2013-03-31: 10 mg via ORAL
  Filled 2013-03-31: qty 2

## 2013-03-31 NOTE — Interval H&P Note (Signed)
History and Physical Interval Note:  03/31/2013 10:16 AM  Marco Morris  has presented today for surgery, with the diagnosis of RIGHT RENAL CALCULUS  The various methods of treatment have been discussed with the patient and family. After consideration of risks, benefits and other options for treatment, the patient has consented to  Procedure(s): RIGHT EXTRACORPOREAL SHOCK WAVE LITHOTRIPSY (ESWL) (Right) as a surgical intervention .  The patient's history has been reviewed, patient examined, no change in status, stable for surgery.  I have reviewed the patient's chart and labs.  Questions were answered to the patient's satisfaction.     Eiliana Drone S

## 2013-03-31 NOTE — Op Note (Signed)
See [facility] OP note scanned into chart. 

## 2013-06-05 ENCOUNTER — Emergency Department (HOSPITAL_COMMUNITY)
Admission: EM | Admit: 2013-06-05 | Discharge: 2013-06-05 | Disposition: A | Discharge status: Home / Self Care | Payer: Managed Care, Other (non HMO) | Attending: Emergency Medicine | Admitting: Emergency Medicine

## 2013-06-05 ENCOUNTER — Emergency Department (HOSPITAL_COMMUNITY): Payer: Managed Care, Other (non HMO)

## 2013-06-05 ENCOUNTER — Encounter (HOSPITAL_COMMUNITY): Payer: Self-pay | Admitting: Emergency Medicine

## 2013-06-05 DIAGNOSIS — E039 Hypothyroidism, unspecified: Secondary | ICD-10-CM | POA: Insufficient documentation

## 2013-06-05 DIAGNOSIS — Z8669 Personal history of other diseases of the nervous system and sense organs: Secondary | ICD-10-CM | POA: Insufficient documentation

## 2013-06-05 DIAGNOSIS — R0602 Shortness of breath: Secondary | ICD-10-CM

## 2013-06-05 DIAGNOSIS — E7800 Pure hypercholesterolemia, unspecified: Secondary | ICD-10-CM | POA: Insufficient documentation

## 2013-06-05 DIAGNOSIS — Z87891 Personal history of nicotine dependence: Secondary | ICD-10-CM | POA: Insufficient documentation

## 2013-06-05 DIAGNOSIS — R079 Chest pain, unspecified: Secondary | ICD-10-CM

## 2013-06-05 DIAGNOSIS — N189 Chronic kidney disease, unspecified: Secondary | ICD-10-CM | POA: Insufficient documentation

## 2013-06-05 DIAGNOSIS — R0789 Other chest pain: Secondary | ICD-10-CM | POA: Insufficient documentation

## 2013-06-05 DIAGNOSIS — Z79899 Other long term (current) drug therapy: Secondary | ICD-10-CM | POA: Insufficient documentation

## 2013-06-05 DIAGNOSIS — I4891 Unspecified atrial fibrillation: Secondary | ICD-10-CM | POA: Insufficient documentation

## 2013-06-05 DIAGNOSIS — Z87442 Personal history of urinary calculi: Secondary | ICD-10-CM | POA: Insufficient documentation

## 2013-06-05 LAB — CBC
HEMATOCRIT: 42.4 % (ref 39.0–52.0)
Hemoglobin: 14.9 g/dL (ref 13.0–17.0)
MCH: 29.9 pg (ref 26.0–34.0)
MCHC: 35.1 g/dL (ref 30.0–36.0)
MCV: 85 fL (ref 78.0–100.0)
Platelets: 146 10*3/uL — ABNORMAL LOW (ref 150–400)
RBC: 4.99 MIL/uL (ref 4.22–5.81)
RDW: 14.1 % (ref 11.5–15.5)
WBC: 6.2 10*3/uL (ref 4.0–10.5)

## 2013-06-05 LAB — BASIC METABOLIC PANEL
BUN: 11 mg/dL (ref 6–23)
CO2: 24 mEq/L (ref 19–32)
Calcium: 9.4 mg/dL (ref 8.4–10.5)
Chloride: 100 mEq/L (ref 96–112)
Creatinine, Ser: 1.16 mg/dL (ref 0.50–1.35)
GFR calc Af Amer: 78 mL/min — ABNORMAL LOW (ref >90)
GFR, EST NON AFRICAN AMERICAN: 67 mL/min — AB (ref >90)
GLUCOSE: 143 mg/dL — AB (ref 70–99)
Potassium: 4.3 mEq/L (ref 3.7–5.3)
Sodium: 139 mEq/L (ref 137–147)

## 2013-06-05 LAB — POCT I-STAT TROPONIN I
TROPONIN I, POC: 0 ng/mL (ref 0.00–0.08)
Troponin i, poc: 0.01 ng/mL (ref 0.00–0.08)

## 2013-06-05 LAB — D-DIMER, QUANTITATIVE (NOT AT ARMC): D DIMER QUANT: 0.37 ug{FEU}/mL (ref 0.00–0.48)

## 2013-06-05 LAB — PRO B NATRIURETIC PEPTIDE: Pro B Natriuretic peptide (BNP): 56.2 pg/mL (ref 0–125)

## 2013-06-05 MED ORDER — ASPIRIN 81 MG PO CHEW
324.0000 mg | CHEWABLE_TABLET | Freq: Once | ORAL | Status: AC
  Administered 2013-06-05: 324 mg via ORAL
  Filled 2013-06-05: qty 4

## 2013-06-05 NOTE — ED Notes (Signed)
Pt reports having right side chest pains and sob since he woke up yesterday, hx of afib. Pt had ekg done at triage. No acute distress noted.

## 2013-06-05 NOTE — ED Provider Notes (Signed)
CSN: [id number]     Arrival date & time 06/05/13  8185 History   First MD Initiated Contact with Patient 06/05/13 0840     Chief Complaint  Patient presents with  . Chest Pain   (Consider location/radiation/quality/duration/timing/severity/associated sxs/prior Treatment) The history is provided by the patient and medical records.   This is a 58 year old male with past medical history significant for paroxysmal atrial fibrillation, hyperlipidemia, hypothyroidism, presenting to the ED for chest pain, onset 4:30 this morning.  Pain localized to left anterior/lateral rib area, described as sharp and stabbing associated with some mild SOB.  Denies any palpitations, dizziness, weakness, diaphoresis, nausea or vomiting.  No alleviating or exacerbating factors.  Pt admits to recent intermittent, non-productive cough without fever or chills.  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  No cross country travel, but drives to [place] almost on a daily basis.  Pt also has hx of blood dyscrasia predisposing him to blood clots, however denies prior hx of DVT or PE. Pt is not currently on any anti-coagulants.  No prior hx of CAD.  VS stable on arrival. Cardiologist-- Dr. Martinique PCP-- Dr. Mellody Drown  Past Medical History  Diagnosis Date  . PAF (paroxysmal atrial fibrillation)   . Hypercholesterolemia   . History of renal calculi   . Hypothyroidism   . Dizzy   . Shortness of breath     shortness of breath even at rest  . Blood dyscrasia     high risk for blood clot formation  . Chronic kidney disease     right renal stone   . Neuromuscular disorder     numbness left foot   Past Surgical History  Procedure Laterality Date  . Cholecystectomy    . Kidney cyst removal    . Lithotripsy  2004   Family History  Problem Relation Age of Onset  . Heart attack Mother   . Stroke Mother   . Diabetes Mother   . Cancer - Ovarian Mother   . Leukemia Mother    . Heart attack Father   . Stroke Father   . Diabetes Father   . Cancer - Other Father   . Hypertension Brother    History  Substance Use Topics  . Smoking status: Former Smoker -- 1.00 packs/day for 14 years    Types: Cigarettes    Quit date: 05/15/1985  . Smokeless tobacco: Former Systems developer    Quit date: 05/15/1985  . Alcohol Use: Yes    Review of Systems  Respiratory: Positive for shortness of breath.   Cardiovascular: Positive for chest pain.  All other systems reviewed and are negative.    Allergies  Codeine  Home Medications   Current Outpatient Rx  Name  Route  Sig  Dispense  Refill  . atorvastatin (LIPITOR) 20 MG tablet   Oral   Take 20 mg by mouth every evening.          . diltiazem (CARDIZEM CD) 240 MG 24 hr capsule   Oral   Take 240 mg by mouth every evening.          Marland Kitchen HYDROcodone-acetaminophen (NORCO/VICODIN) 5-325 MG per tablet   Oral   Take 1-2 tablets by mouth every 6 (six) hours as needed.   20 tablet   0   . levothyroxine (SYNTHROID, LEVOTHROID) 125 MCG tablet   Oral   Take 125 mcg by mouth daily before breakfast.  BP 168/92  Pulse 78  Temp(Src) 97.9 F (36.6 C) (Oral)  Resp 18  SpO2 99%  Physical Exam  Nursing note and vitals reviewed. Constitutional: He is oriented to person, place, and time. He appears well-developed and well-nourished. No distress.  HENT:  Head: Normocephalic and atraumatic.  Mouth/Throat: Oropharynx is clear and moist.  Eyes: Conjunctivae and EOM are normal. Pupils are equal, round, and reactive to light.  Neck: Normal range of motion. Neck supple.  Cardiovascular: Normal rate, regular rhythm and normal heart sounds.   Pulmonary/Chest: Effort normal and breath sounds normal. No respiratory distress. He has no wheezes. He has no rhonchi.  Normal work of breathing without accessory muscle use, no audible wheezes or rhonchi, patient speaking in full complete sentences without difficulty   Musculoskeletal: Normal range of motion. He exhibits no edema.  No calf pain, asymmetry, or palpable cord; negative homan's sign bilaterally; skin non-erythematous without warmth to touch  Neurological: He is alert and oriented to person, place, and time.  Skin: Skin is warm and dry. He is not diaphoretic.  Psychiatric: He has a normal mood and affect.    ED Course  Procedures (including critical care time) Labs Review Labs Reviewed  CBC - Abnormal; Notable for the following:    Platelets 146 (*)    All other components within normal limits  BASIC METABOLIC PANEL - Abnormal; Notable for the following:    Glucose, Bld 143 (*)    GFR calc non Af Amer 67 (*)    GFR calc Af Amer 78 (*)    All other components within normal limits  PRO B NATRIURETIC PEPTIDE  D-DIMER, QUANTITATIVE  POCT I-STAT TROPONIN I  POCT I-STAT TROPONIN I   Imaging Review No results found.  EKG Interpretation    Date/Time:  Thursday June 05 2013 08:36:34 EST Ventricular Rate:  83 PR Interval:  128 QRS Duration: 90 QT Interval:  370 QTC Calculation: 434 R Axis:   14 Text Interpretation:  Normal sinus rhythm Normal ECG No significant change since last tracing Confirmed by HARRISON  MD, FORREST ([id number]) on 06/05/2013 8:44:01 AM            MDM   1. Chest pain    EKG normal sinus rhythm, no acute ischemic changes. Chest x-ray is clear. Initial and delta troponin negative. Labs are reassuring, including d-dimer.  Case was discussed with on-call cardiologist, Dr. Lovena Le, advised to obtain cardiac ultrasound to r/o pericardiac effusion-- if negative may discharge home with close office FU.  Bedside cardiac ultrasound was performed by Dr. Aline Brochure which was negative.  Pt instructed to continue all home medications.  Advised to call Dr. Doug Sou office today to scheduled FU appt.  Discussed plan of care with pt and daughter at beside who acknowledged understanding and agreed.  Strict return precautions  advised for new or worsening symptoms.  Larene Pickett, PA-C 06/05/13 1446

## 2013-06-05 NOTE — ED Notes (Signed)
Patient transported to X-ray 

## 2013-06-05 NOTE — ED Provider Notes (Signed)
Medical screening examination/treatment/procedure(s) were conducted as a shared visit with non-physician practitioner(s) and myself.  I personally evaluated the patient during the encounter.  EKG Interpretation    Date/Time:  Thursday June 05 2013 08:36:34 EST Ventricular Rate:  83 PR Interval:  128 QRS Duration: 90 QT Interval:  370 QTC Calculation: 434 R Axis:   14 Text Interpretation:  Normal sinus rhythm Normal ECG No significant change since last tracing Confirmed by Marijose Curington  MD, Donetta Isaza ([id number]) on 06/05/2013 8:44:01 AM            I interviewed and examined the patient. Lungs are CTAB. Cardiac exam wnl. Abdomen soft. Pt asx here. CP atypical. Doubt cardiac cause. Low risk for PE, d-dimer neg. Delta trop neg. Case discussed w/ cards who recommended [place] to r/o pericardial effusion. I performed a bedside cardiac [place] and found a small physiologic effusion which is normal. Pt continues to appear well. Will have him f/u w/ his cardiologist. Return for any worsening.    Blanchard Kelch, MD 06/05/13 [phone]

## 2013-06-05 NOTE — Discharge Instructions (Signed)
Take the prescribed medication as directed. Follow-up with Dr. Martinique within 1 week for re-check --- recommend calling their office today to schedule an appt. Return to the ED for new or worsening symptoms.

## 2013-06-16 ENCOUNTER — Encounter (INDEPENDENT_AMBULATORY_CARE_PROVIDER_SITE_OTHER): Payer: Managed Care, Other (non HMO)

## 2013-06-16 ENCOUNTER — Encounter: Payer: Self-pay | Admitting: Cardiology

## 2013-06-16 ENCOUNTER — Ambulatory Visit (INDEPENDENT_AMBULATORY_CARE_PROVIDER_SITE_OTHER): Payer: Managed Care, Other (non HMO) | Admitting: Cardiology

## 2013-06-16 ENCOUNTER — Encounter: Payer: Self-pay | Admitting: *Deleted

## 2013-06-16 ENCOUNTER — Telehealth: Payer: Self-pay | Admitting: Physician Assistant

## 2013-06-16 VITALS — BP 116/82 | HR 80 | Ht 72.0 in | Wt 185.6 lb

## 2013-06-16 DIAGNOSIS — I4891 Unspecified atrial fibrillation: Secondary | ICD-10-CM

## 2013-06-16 DIAGNOSIS — R42 Dizziness and giddiness: Secondary | ICD-10-CM

## 2013-06-16 DIAGNOSIS — E7800 Pure hypercholesterolemia, unspecified: Secondary | ICD-10-CM

## 2013-06-16 DIAGNOSIS — I480 Paroxysmal atrial fibrillation: Secondary | ICD-10-CM

## 2013-06-16 NOTE — Progress Notes (Signed)
Patient ID: Marco Morris, male   DOB: [birth year], 58 y.o.   MRN: [id number] E-[employer]io [place] 30 day cardiac event monitor applied to patient.

## 2013-06-16 NOTE — Telephone Encounter (Signed)
Rec'd a call from monitor company re: rapid atrial fib, pt had gone into atrial fib, rate 160s. They were unable to contact him.  Pt contacted by phone. He was aware of the palpitations earlier but not having them now. He had no chest pain, no change in his DOE and no presyncope.   Requested he check his BP when he gets home, to make sure he is tolerating it well. Call if problems or symptoms.

## 2013-06-16 NOTE — Patient Instructions (Signed)
We will have you wear an event monitor  Continue your current therapy  I will see you in 2 months.

## 2013-06-16 NOTE — Progress Notes (Signed)
Marco Morris Date of Birth: [birth year]   History of Present Illness: Marco Morris is seen for fibrillation dating back to 2003. This initially was managed with beta blocker therapy. Due to excessive fatigue he was switched to diltiazem on his last visit. On follow up today he reports he is not doing well. He complains of a 2 week history of increased SOB and nonproductive cough. He also complains of more Atrial fibrillation. He was seen in the ED on 06/05/13 and was in NSR. CXR showed no acute change. BNP and troponin were normal. He reports he was seen by Dr. Mellody Drown last Wednesday and was in AFib. Rate unknown. Thinks he is out of rhythm a lot including today. He is now on Xarelto for anticoagulation. There is a family history of a clotting disorder and apparently Marco Morris has tested positive for this. 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  Current Outpatient Prescriptions on File Prior to Visit  Medication Sig Dispense Refill  . atorvastatin (LIPITOR) 20 MG tablet Take 20 mg by mouth every evening.       Marland Kitchen levothyroxine (SYNTHROID, LEVOTHROID) 125 MCG tablet Take 125 mcg by mouth daily before breakfast.        No current facility-administered medications on file prior to visit.    Allergies  Allergen Reactions  . Codeine     unknown    Past Medical History  Diagnosis Date  . PAF (paroxysmal atrial fibrillation)   . Hypercholesterolemia   . History of renal calculi   . Hypothyroidism   . Dizzy   . Shortness of breath     shortness of breath even at rest  . Blood dyscrasia     high risk for blood clot formation  . Chronic kidney disease     right renal stone   . Neuromuscular disorder     numbness left foot    Past Surgical History  Procedure Laterality Date  . Cholecystectomy    . Kidney cyst removal    . Lithotripsy  2004    History  Smoking status  . Former Smoker -- 1.00 packs/day for 14 years  . Types: Cigarettes  . Quit date: 05/15/1985  Smokeless tobacco   . Former Systems developer  . Quit date: 05/15/1985    History  Alcohol Use  . Yes    Family History  Problem Relation Age of Onset  . Heart attack Mother   . Stroke Mother   . Diabetes Mother   . Cancer - Ovarian Mother   . Leukemia Mother   . Heart attack Father   . Stroke Father   . Diabetes Father   . Cancer - Other Father   . Hypertension Brother     Review of Systems: As noted in history of present illness.  All other systems were reviewed and are negative.  Physical Exam: BP 116/82  Pulse 80  Ht 6' (1.829 m)  Wt 185 lb 9.6 oz (84.188 kg)  BMI 25.17 kg/m2  SpO2 99% He is a well-developed white male in no acute distress. HEENT exam is unremarkable. He has no JVD or bruits. Lungs are clear. Cardiac exam reveals a regular rate and rhythm without gallop, murmur, or click. Abdomen is soft and nontender without mass or bruits. Extremities are without edema. Pulses are 2+ and symmetric throughout. Skin is warm and dry. Neurologic exam he is alert and oriented x3. Cranial nerves II through XII are intact. He has no focal findings.  LABORATORY DATA: Lab  Results  Component Value Date   WBC 6.2 06/05/2013   HGB 14.9 06/05/2013   HCT 42.4 06/05/2013   PLT 146* 06/05/2013   GLUCOSE 143* 06/05/2013   CHOL  Value: 116        ATP III CLASSIFICATION:  <200     mg/dL   Desirable  200-239  mg/dL   Borderline High  >=240    mg/dL   High        12/23/2008   TRIG 138 12/23/2008   HDL 35* 12/23/2008   LDLCALC  Value: 53        Total Cholesterol/HDL:CHD Risk Coronary Heart Disease Risk Table                     Men   Women  1/2 Average Risk   3.4   3.3  Average Risk       5.0   4.4  2 X Average Risk   9.6   7.1  3 X Average Risk  23.4   11.0        Use the calculated Patient Ratio above and the CHD Risk Table to determine the patient's CHD Risk.        ATP III CLASSIFICATION (LDL):  <100     mg/dL   Optimal  100-129  mg/dL   Near or Above                    Optimal  130-159  mg/dL   Borderline  160-189   mg/dL   High  >190     mg/dL   Very High 12/23/2008   ALT 33 03/08/2009   AST 23 03/08/2009   NA 139 06/05/2013   K 4.3 06/05/2013   CL 100 06/05/2013   CREATININE 1.16 06/05/2013   BUN 11 06/05/2013   CO2 24 06/05/2013   HGBA1C 5.8* 01/30/2013   Ecg: 06/05/13: normal.  MBW:GYKZL 2 VIEW  COMPARISON: 10/26/2010  FINDINGS: The heart size and mediastinal contours are within normal limits. Both lungs are clear. The visualized skeletal structures are unremarkable.  IMPRESSION: No active cardiopulmonary disease.   Electronically Signed By: Franchot Gallo M.D.    Assessment / Plan: 1. Paroxysmal atrial fibrillation. Now on diltiazem for rate control He thinks he is out of rhythm more but his rate is regular today. Will have him wear an event monitor to see if we can get a better idea of his arrhythmia burden. If he is having more frequent or sustained arrhythmia may need to consider anti-arrhythmic drug therapy. I have requested copies of records from Dr. Mellody Drown.   2. Hypercholesterolemia. Now on lipitor.  3. Cough and dyspnea. ? Viral URI. Recent CXR and BNP normal.

## 2013-06-17 ENCOUNTER — Telehealth: Payer: Self-pay

## 2013-06-17 MED ORDER — DILTIAZEM HCL ER COATED BEADS 240 MG PO CP24: ORAL_CAPSULE | ORAL | Status: DC

## 2013-06-17 NOTE — Telephone Encounter (Signed)
Patient called spoke to wife Dr.Jordan reviewed Ecardio monitor strips which revealed atrial fib with rates 180,170,160,190 beats/min.Dr.Jordan advised to increase Diltiazem 240 mg twice a day.

## 2013-06-25 ENCOUNTER — Telehealth: Payer: Self-pay

## 2013-06-25 MED ORDER — FLECAINIDE ACETATE 100 MG PO TABS: 100.0000 mg | ORAL_TABLET | Freq: Two times a day (BID) | ORAL | Status: DC

## 2013-06-25 NOTE — Telephone Encounter (Signed)
Received Ecardio monitor strips which revealed atrial fib rate 170,160 beats/min.Dr.Jordan reviewed and advised to add Flecainide 100 mg twice a day.Continue Diltiazem 240 mg twice a day.

## 2013-07-07 ENCOUNTER — Telehealth: Payer: Self-pay | Admitting: Internal Medicine

## 2013-07-07 ENCOUNTER — Telehealth: Payer: Self-pay | Admitting: Physician Assistant

## 2013-07-07 NOTE — Telephone Encounter (Signed)
     I returned a page from [name] with [employer] who alerted me that Marco Morris had a 4.3 conversion pause after converting from atrial flutter (110bpm) to Sinus (61 bpm). He is followed by Dr. Martinique.  Perry Mount PA-C  [facility]

## 2013-07-07 NOTE — Telephone Encounter (Signed)
New message ° ° ° ° °Returned a nurses call °

## 2013-07-07 NOTE — Telephone Encounter (Signed)
Spoke to patient Dr.Jordan reviewed 2 monitor strips from [employer] which revealed atrial flutter with 4.3 second pause and a 3.4 second pause.Dr.Jordan advised needs to see EP for possible atrial fib ablation.Appointment scheduled with Dr.Allred 07/16/13 at 9:00 am.

## 2013-07-07 NOTE — Telephone Encounter (Signed)
Patient called no answer.Left message to call me back about monitor.

## 2013-07-09 ENCOUNTER — Telehealth: Payer: Self-pay | Admitting: Cardiology

## 2013-07-09 NOTE — Telephone Encounter (Signed)
I was called by the monitoring service. He has had another PAF conversion pause of 4 seconds. The pt denies any syncope but he admitted he does get "light headed" sometimes. I decreased his Diltiazem to 240 mg BID to 240 mg daily. He is on Flecainide 100 mg BID. He has an appointment with EP on 3/4 and will keep that. He knows to call if he has any syncope or near syncope. I suggested he not drive or be alone.  Kerin Ransom PA-C 07/09/2013 6:31 PM

## 2013-07-11 ENCOUNTER — Telehealth: Payer: Self-pay | Admitting: Cardiology

## 2013-07-11 NOTE — Telephone Encounter (Signed)
Spoke w/pt.  He was unaware that wife had called.  Asked if he was having symptoms.  States he has episode of dizziness last night lasting for short period.  Is wearing a monitor.  Advised to punch button on monitor to note that he was having symptoms. States he is feeling fine now.  Has an app to see Dr. Rayann Heman on 3/4.  Advised he doesn't have anything sooner due to cancellations and weather.  He understands and is fine with that appointment.

## 2013-07-11 NOTE — Telephone Encounter (Signed)
New message         Pt would like to see dr allred sooner for consult. Pt wife says pt is wearing a monitor and is still having complications. Pt wife would like to talk to cheryl. I advised pt that she is not here. Pt wife says that if he persists she will take him to the ER.

## 2013-07-16 ENCOUNTER — Encounter: Payer: Self-pay | Admitting: *Deleted

## 2013-07-16 ENCOUNTER — Ambulatory Visit (INDEPENDENT_AMBULATORY_CARE_PROVIDER_SITE_OTHER): Payer: Managed Care, Other (non HMO) | Admitting: Internal Medicine

## 2013-07-16 ENCOUNTER — Other Ambulatory Visit: Payer: Managed Care, Other (non HMO)

## 2013-07-16 ENCOUNTER — Ambulatory Visit (HOSPITAL_COMMUNITY)
Admission: RE | Admit: 2013-07-16 | Discharge: 2013-07-16 | Disposition: A | Discharge status: Home / Self Care | Payer: Managed Care, Other (non HMO) | Source: Ambulatory Visit | Attending: Cardiovascular Disease | Admitting: Cardiovascular Disease

## 2013-07-16 ENCOUNTER — Encounter (HOSPITAL_COMMUNITY): Payer: Self-pay | Admitting: Pharmacy Technician

## 2013-07-16 ENCOUNTER — Encounter: Payer: Self-pay | Admitting: Internal Medicine

## 2013-07-16 VITALS — BP 132/84 | HR 73 | Ht 72.0 in | Wt 190.0 lb

## 2013-07-16 DIAGNOSIS — I480 Paroxysmal atrial fibrillation: Secondary | ICD-10-CM

## 2013-07-16 DIAGNOSIS — I495 Sick sinus syndrome: Secondary | ICD-10-CM

## 2013-07-16 DIAGNOSIS — I4891 Unspecified atrial fibrillation: Secondary | ICD-10-CM

## 2013-07-16 DIAGNOSIS — I369 Nonrheumatic tricuspid valve disorder, unspecified: Secondary | ICD-10-CM

## 2013-07-16 DIAGNOSIS — I4892 Unspecified atrial flutter: Secondary | ICD-10-CM

## 2013-07-16 LAB — BASIC METABOLIC PANEL
BUN: 11 mg/dL (ref 6–23)
CHLORIDE: 103 meq/L (ref 96–112)
CO2: 29 meq/L (ref 19–32)
Calcium: 9.2 mg/dL (ref 8.4–10.5)
Creatinine, Ser: 1.3 mg/dL (ref 0.4–1.5)
GFR: 58.81 mL/min — AB (ref >60.00)
Glucose, Bld: 125 mg/dL — ABNORMAL HIGH (ref 70–99)
POTASSIUM: 3.6 meq/L (ref 3.5–5.1)
SODIUM: 138 meq/L (ref 135–145)

## 2013-07-16 LAB — CBC WITH DIFFERENTIAL/PLATELET
BASOS ABS: 0 10*3/uL (ref 0.0–0.1)
Basophils Relative: 0.5 % (ref 0.0–3.0)
Eosinophils Absolute: 0.5 10*3/uL (ref 0.0–0.7)
Eosinophils Relative: 6.8 % — ABNORMAL HIGH (ref 0.0–5.0)
HEMATOCRIT: 43 % (ref 39.0–52.0)
HEMOGLOBIN: 14.1 g/dL (ref 13.0–17.0)
LYMPHS ABS: 1.9 10*3/uL (ref 0.7–4.0)
Lymphocytes Relative: 25.8 % (ref 12.0–46.0)
MCHC: 32.9 g/dL (ref 30.0–36.0)
MCV: 89.3 fl (ref 78.0–100.0)
MONO ABS: 0.3 10*3/uL (ref 0.1–1.0)
MONOS PCT: 4.6 % (ref 3.0–12.0)
Neutro Abs: 4.5 10*3/uL (ref 1.4–7.7)
Neutrophils Relative %: 62.3 % (ref 43.0–77.0)
PLATELETS: 175 10*3/uL (ref 150.0–400.0)
RBC: 4.82 Mil/uL (ref 4.22–5.81)
RDW: 14.9 % — AB (ref 11.5–14.6)
WBC: 7.3 10*3/uL (ref 4.5–10.5)

## 2013-07-16 NOTE — Patient Instructions (Addendum)
Your physician has requested that you have an echocardiogram. Echocardiography is a painless test that uses sound waves to create images of your heart. It provides your doctor with information about the size and shape of your heart and how well your heart's chambers and valves are working. This procedure takes approximately one hour. There are no restrictions for this procedure.  See instruction sheet for leadless pacemaker

## 2013-07-16 NOTE — Progress Notes (Signed)
2D Echo Performed 07/16/2013    Danyal Whitenack, RCS  

## 2013-07-17 ENCOUNTER — Telehealth: Payer: Self-pay | Admitting: *Deleted

## 2013-07-17 ENCOUNTER — Telehealth: Payer: Self-pay | Admitting: Internal Medicine

## 2013-07-17 ENCOUNTER — Encounter: Payer: Self-pay | Admitting: Internal Medicine

## 2013-07-17 ENCOUNTER — Encounter: Payer: Self-pay | Admitting: *Deleted

## 2013-07-17 DIAGNOSIS — I495 Sick sinus syndrome: Secondary | ICD-10-CM | POA: Insufficient documentation

## 2013-07-17 DIAGNOSIS — I4892 Unspecified atrial flutter: Secondary | ICD-10-CM | POA: Insufficient documentation

## 2013-07-17 MED ORDER — CEFAZOLIN SODIUM-DEXTROSE 2-3 GM-% IV SOLR
2.0000 g | INTRAVENOUS | Status: DC
  Filled 2013-07-17: qty 50

## 2013-07-17 NOTE — Telephone Encounter (Signed)
Spoke with pt and advised Dr. Rayann Heman will have his return to work letter available for pick up this afternoon. Pt reminded to be at admitting tomorrow, 07/18/13 at 8:30am for Leadless Pacemaker implant. Pt advised to stop Flecainide in preparation for Tykosin load while in the hospital. Pt verbalizes understanding.

## 2013-07-17 NOTE — Research (Signed)
LEADLESS Informed Consent   Subject Name: Marco Morris  Subject met inclusion and exclusion criteria.  The informed consent form, study requirements and expectations were reviewed with the subject and questions and concerns were addressed prior to the signing of the consent form.  The subject verbalized understanding of the trail requirements.  The subject agreed to participate in the LEADLESS trial and signed the informed consent.  The informed consent was obtained prior to performance of any protocol-specific procedures for the subject.  A copy of the signed informed consent was given to the subject and a copy was placed in the subject's medical record.  Shawn Lord 07/16/2013 @ 1040AM  

## 2013-07-17 NOTE — Progress Notes (Signed)
Primary Care Physician: MOREIRA,ROY, MD Referring Physician:  Dr Jordan   Marco Morris is a 58 y.o. male with a h/o paroxysmal atrial fibrillation who is referred for EP consultation.  He reports that he was first diagnosed with atrial fibrillation in 2003.  This initially was managed with beta blocker therapy. Due to excessive fatigue he was switched to diltiazem.   He reports increasing frequency and duration of atrial fibrillation.  He reports symptoms of palpitations, decreased exercise tolerance and fatigue with afib.  He has also had several episodes of abrupt presyncope.  He has not had frank syncope.  He thinks that he is in afib most days.  He is appropriately anticoagulated with xarelto.  There is a family history of a clotting disorder and apparently Marco Morris has tested positive for this.   He has been started on flecainide but does not feel that this has improved his afib.  His wife actually thinks that his afib is worse.  Recently, an event monitor was placed on by Dr Jordan.  This has documented episodes of afib with RVR as well as atrial flutter and post termination pauses of > 4 seconds.  His pauses have corresponded to symptoms of presyncope.  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  Today, he denies symptoms of chest pain, shortness of breath, orthopnea, PND, lower extremity edema, or neurologic sequela. The patient is tolerating medications without difficulties and is otherwise without complaint today.   Past Medical History  Diagnosis Date  . PAF (paroxysmal atrial fibrillation)   . Hypercholesterolemia   . History of renal calculi   . Hypothyroidism   . Dizzy   . Shortness of breath     shortness of breath even at rest  . Blood dyscrasia     high risk for blood clot formation  . Chronic kidney disease     right renal stone   . Neuromuscular disorder     numbness left foot  . Tachycardia-bradycardia syndrome     post termination  pauses with afib and symptoms of presyncope   Past Surgical History  Procedure Laterality Date  . Cholecystectomy    . Kidney cyst removal    . Lithotripsy  2004    Current Outpatient Prescriptions  Medication Sig Dispense Refill  . atorvastatin (LIPITOR) 20 MG tablet Take 20 mg by mouth every evening.       . diltiazem (CARDIZEM CD) 240 MG 24 hr capsule Take 240 mg by mouth daily.      . flecainide (TAMBOCOR) 100 MG tablet Take 1 tablet (100 mg total) by mouth 2 (two) times daily.  60 tablet  6  . levothyroxine (SYNTHROID, LEVOTHROID) 125 MCG tablet Take 125 mcg by mouth daily before breakfast.       . Rivaroxaban (XARELTO) 20 MG TABS tablet Take 20 mg by mouth daily.       No current facility-administered medications for this visit.   Facility-Administered Medications Ordered in Other Visits  Medication Dose Route Frequency Provider Last Rate Last Dose  . ceFAZolin (ANCEF) IVPB 2 g/50 mL premix  2 g Intravenous On Call Jendaya Gossett, MD        Allergies  Allergen Reactions  . Codeine     unknown    History   Social History  . Marital Status: Married    Spouse Name: [name]    Number of Children: 2  . Years of Education: [name]   Occupational History  .   DRIVER Marco Morris   Social History Main Topics  . Smoking status: Former Smoker -- 1.00 packs/day for 14 years    Types: Cigarettes    Quit date: 05/15/1985  . Smokeless tobacco: Former Systems developer    Quit date: 05/15/1985  . Alcohol Use: Yes  . Drug Use: No  . Sexual Activity: Not on file   Other Topics Concern  . 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    Family History  Problem Relation Age of Onset  . Heart attack Mother   . Stroke Mother   . Diabetes Mother   . Cancer - Ovarian Mother   . Leukemia Mother   . Heart attack Father   . Stroke Father   . Diabetes Father   . Cancer - Other Father   . Hypertension Brother     ROS- All systems are reviewed and  negative except as per the HPI above  Physical Exam: Filed Vitals:   07/16/13 0858  BP: 132/84  Pulse: 73  Height: 6' (1.829 m)  Weight: 190 lb (86.183 kg)    GEN- The patient is well appearing, alert and oriented x 3 today.   Head- normocephalic, atraumatic Eyes-  Sclera clear, conjunctiva pink Ears- hearing intact Oropharynx- clear Neck- supple, no JVP Lymph- no cervical lymphadenopathy Lungs- Clear to ausculation bilaterally, normal work of breathing Heart- Regular rate and rhythm, no murmurs, rubs or gallops, PMI not laterally displaced GI- soft, NT, ND, + BS Extremities- no clubbing, cyanosis, or edema MS- no significant deformity or atrophy Skin- no rash or lesion Psych- euthymic mood, full affect Neuro- strength and sensation are intact  EKG today reveals sinus rhythm 73 bpm, PR 164, QTc 436, otherwise normal ekg Echo is reviewed and normal Event monitor is reviewed (more than 50 pages) as described above Dr Morrison Old records are also reviewed  Assessment and Plan:  1. The patient has symptomatic paroxysmal atrial fibrillation and atrial flutter.  He has failed antiarrhythmic drug therapy with flecainide.  He has post termination pauses which he finds concerning.  Therapeutic strategies for afib including medicine and ablation were discussed in detail with the patient today.  As he drives a large truck, I am very concerned about post termination pauses and his risks of syncope.  Unfortunately, I cannot be confident that with ablation that this risk would be sufficiently reduced.  I really think that pacing to prevent pauses is the best strategy for this patient.  I think that a leadless pacemaker would be a very good option.  We could then switch his AAD from flecainide to tikosyn.  If he has further atrial arrhythmias on tikosyn then ablation would be the best next strategy. Risks, benefits, alternatives to leadless and transvenous pacemaker implantation were discussed in  detail with the patient today. The patient understands that the risks include but are not limited to bleeding, infection, pneumothorax, perforation, tamponade, vascular damage, renal failure, MI, stroke, death,  and lead dislodgement and wishes to proceed.  He would prefer a leadless device.  I will have our research team contact him. We will schedule the procedure at the next available time. chads2vasc score is 0 however he has a h/o hypercoagulability (per report).  He will therefore continue xarelto at this time.  2. Tachycardia/ bradycardia syndrome Pacemaker is recommended as above Initiation of tikosyn after PPM Better rate control can be acheived with medical therapy after leadless pacemaker has been implanted.

## 2013-07-17 NOTE — Telephone Encounter (Signed)
Letter done and out front for pick up.  lmom for patient that it is ready

## 2013-07-17 NOTE — Telephone Encounter (Signed)
New message        Pt needs a detailed letter of why he is being taken out of work for 2 weeks for his job. He said he can come pick up the letter this afternoon. Please give him a call.

## 2013-07-18 ENCOUNTER — Encounter (HOSPITAL_COMMUNITY)
Admission: RE | Disposition: A | Discharge status: Home / Self Care | Payer: Managed Care, Other (non HMO) | Source: Ambulatory Visit | Attending: Internal Medicine

## 2013-07-18 ENCOUNTER — Inpatient Hospital Stay (HOSPITAL_COMMUNITY)
Admission: RE | Admit: 2013-07-18 | Discharge: 2013-07-22 | DRG: 309 | Disposition: A | Discharge status: Home / Self Care | Payer: Managed Care, Other (non HMO) | Source: Ambulatory Visit | Attending: Internal Medicine | Admitting: Internal Medicine

## 2013-07-18 ENCOUNTER — Encounter (HOSPITAL_COMMUNITY): Payer: Self-pay | Admitting: General Practice

## 2013-07-18 DIAGNOSIS — Z833 Family history of diabetes mellitus: Secondary | ICD-10-CM

## 2013-07-18 DIAGNOSIS — E785 Hyperlipidemia, unspecified: Secondary | ICD-10-CM | POA: Diagnosis present

## 2013-07-18 DIAGNOSIS — I4892 Unspecified atrial flutter: Secondary | ICD-10-CM | POA: Diagnosis present

## 2013-07-18 DIAGNOSIS — R42 Dizziness and giddiness: Secondary | ICD-10-CM

## 2013-07-18 DIAGNOSIS — Z8249 Family history of ischemic heart disease and other diseases of the circulatory system: Secondary | ICD-10-CM

## 2013-07-18 DIAGNOSIS — I495 Sick sinus syndrome: Secondary | ICD-10-CM

## 2013-07-18 DIAGNOSIS — Z806 Family history of leukemia: Secondary | ICD-10-CM

## 2013-07-18 DIAGNOSIS — E039 Hypothyroidism, unspecified: Secondary | ICD-10-CM | POA: Diagnosis present

## 2013-07-18 DIAGNOSIS — I480 Paroxysmal atrial fibrillation: Secondary | ICD-10-CM | POA: Diagnosis present

## 2013-07-18 DIAGNOSIS — Z87891 Personal history of nicotine dependence: Secondary | ICD-10-CM

## 2013-07-18 DIAGNOSIS — I4891 Unspecified atrial fibrillation: Secondary | ICD-10-CM | POA: Diagnosis present

## 2013-07-18 DIAGNOSIS — Z885 Allergy status to narcotic agent status: Secondary | ICD-10-CM

## 2013-07-18 DIAGNOSIS — G609 Hereditary and idiopathic neuropathy, unspecified: Secondary | ICD-10-CM

## 2013-07-18 DIAGNOSIS — N200 Calculus of kidney: Secondary | ICD-10-CM

## 2013-07-18 DIAGNOSIS — R1031 Right lower quadrant pain: Secondary | ICD-10-CM

## 2013-07-18 DIAGNOSIS — E7800 Pure hypercholesterolemia, unspecified: Secondary | ICD-10-CM

## 2013-07-18 DIAGNOSIS — K831 Obstruction of bile duct: Secondary | ICD-10-CM

## 2013-07-18 DIAGNOSIS — Z823 Family history of stroke: Secondary | ICD-10-CM

## 2013-07-18 HISTORY — PX: PERMANENT PACEMAKER INSERTION: SHX5480

## 2013-07-18 LAB — GLUCOSE, CAPILLARY: Glucose-Capillary: 119 mg/dL — ABNORMAL HIGH (ref 70–99)

## 2013-07-18 LAB — SURGICAL PCR SCREEN
MRSA, PCR: NEGATIVE
Staphylococcus aureus: NEGATIVE

## 2013-07-18 SURGERY — PERMANENT PACEMAKER INSERTION

## 2013-07-18 MED ORDER — ATORVASTATIN CALCIUM 20 MG PO TABS
20.0000 mg | ORAL_TABLET | Freq: Every evening | ORAL | Status: DC
  Administered 2013-07-18 – 2013-07-21 (×4): 20 mg via ORAL
  Filled 2013-07-18 (×5): qty 1

## 2013-07-18 MED ORDER — MUPIROCIN CALCIUM 2 % EX CREA: TOPICAL_CREAM | Freq: Two times a day (BID) | CUTANEOUS | Status: DC

## 2013-07-18 MED ORDER — FENTANYL CITRATE 0.05 MG/ML IJ SOLN
50.0000 ug | INTRAMUSCULAR | Status: DC | PRN
Start: 2013-07-18 — End: 2013-07-22

## 2013-07-18 MED ORDER — SODIUM CHLORIDE 0.9 % IJ SOLN: 3.0000 mL | INTRAMUSCULAR | Status: DC | PRN

## 2013-07-18 MED ORDER — MUPIROCIN 2 % EX OINT
TOPICAL_OINTMENT | CUTANEOUS | Status: AC
Start: 2013-07-18 — End: 2013-07-18
  Administered 2013-07-18: 08:00:00
  Filled 2013-07-18: qty 22

## 2013-07-18 MED ORDER — SODIUM CHLORIDE 0.45 % IV SOLN
INTRAVENOUS | Status: DC
  Administered 2013-07-18: 08:00:00 via INTRAVENOUS

## 2013-07-18 MED ORDER — SODIUM CHLORIDE 0.9 % IV SOLN: 250.0000 mL | INTRAVENOUS | Status: DC | PRN

## 2013-07-18 MED ORDER — LEVOTHYROXINE SODIUM 125 MCG PO TABS
125.0000 ug | ORAL_TABLET | Freq: Every day | ORAL | Status: DC
  Administered 2013-07-19 – 2013-07-22 (×4): 125 ug via ORAL
  Filled 2013-07-18 (×5): qty 1

## 2013-07-18 MED ORDER — FENTANYL CITRATE 0.05 MG/ML IJ SOLN
INTRAMUSCULAR | Status: AC
  Filled 2013-07-18: qty 2

## 2013-07-18 MED ORDER — SODIUM CHLORIDE 0.9 % IV SOLN
INTRAVENOUS | Status: DC
Start: 2013-07-18 — End: 2013-07-18
  Administered 2013-07-18: 08:00:00 via INTRAVENOUS

## 2013-07-18 MED ORDER — SODIUM CHLORIDE 0.9 % IJ SOLN
3.0000 mL | Freq: Two times a day (BID) | INTRAMUSCULAR | Status: DC
  Administered 2013-07-18 – 2013-07-21 (×6): 3 mL via INTRAVENOUS

## 2013-07-18 MED ORDER — RIVAROXABAN 20 MG PO TABS
20.0000 mg | ORAL_TABLET | Freq: Every day | ORAL | Status: DC
  Administered 2013-07-18 – 2013-07-21 (×4): 20 mg via ORAL
  Filled 2013-07-18 (×5): qty 1

## 2013-07-18 MED ORDER — POTASSIUM CHLORIDE CRYS ER 20 MEQ PO TBCR
40.0000 meq | EXTENDED_RELEASE_TABLET | Freq: Once | ORAL | Status: AC
  Administered 2013-07-18: 40 meq via ORAL
  Filled 2013-07-18: qty 2

## 2013-07-18 MED ORDER — MIDAZOLAM HCL 5 MG/5ML IJ SOLN
INTRAMUSCULAR | Status: AC
  Filled 2013-07-18: qty 5

## 2013-07-18 MED ORDER — DILTIAZEM HCL ER COATED BEADS 240 MG PO CP24
240.0000 mg | ORAL_CAPSULE | Freq: Every day | ORAL | Status: DC
  Administered 2013-07-18 – 2013-07-20 (×3): 240 mg via ORAL
  Filled 2013-07-18 (×4): qty 1

## 2013-07-18 NOTE — Progress Notes (Signed)
UR Completed Nikkie Liming Graves-Bigelow, RN,BSN [phone]  

## 2013-07-18 NOTE — H&P (View-Only) (Signed)
Primary Care Physician: Jilda Panda, MD Referring Physician:  Dr Martinique   Marco Morris is a 58 y.o. male with a h/o paroxysmal atrial fibrillation who is referred for EP consultation.  He reports that he was first diagnosed with atrial fibrillation in 2003.  This initially was managed with beta blocker therapy. Due to excessive fatigue he was switched to diltiazem.   He reports increasing frequency and duration of atrial fibrillation.  He reports symptoms of palpitations, decreased exercise tolerance and fatigue with afib.  He has also had several episodes of abrupt presyncope.  He has not had frank syncope.  He thinks that he is in afib most days.  He is appropriately anticoagulated with xarelto.  There is a family history of a clotting disorder and apparently Marco Morris has tested positive for this.   He has been started on flecainide but does not feel that this has improved his afib.  His wife actually thinks that his afib is worse.  Recently, an event monitor was placed on by Dr Martinique.  This has documented episodes of afib with RVR as well as atrial flutter and post termination pauses of > 4 seconds.  His pauses have corresponded to symptoms of presyncope.  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  Today, he denies symptoms of chest pain, shortness of breath, orthopnea, PND, lower extremity edema, or neurologic sequela. The patient is tolerating medications without difficulties and is otherwise without complaint today.   Past Medical History  Diagnosis Date  . PAF (paroxysmal atrial fibrillation)   . Hypercholesterolemia   . History of renal calculi   . Hypothyroidism   . Dizzy   . Shortness of breath     shortness of breath even at rest  . Blood dyscrasia     high risk for blood clot formation  . Chronic kidney disease     right renal stone   . Neuromuscular disorder     numbness left foot  . Tachycardia-bradycardia syndrome     post termination  pauses with afib and symptoms of presyncope   Past Surgical History  Procedure Laterality Date  . Cholecystectomy    . Kidney cyst removal    . Lithotripsy  2004    Current Outpatient Prescriptions  Medication Sig Dispense Refill  . atorvastatin (LIPITOR) 20 MG tablet Take 20 mg by mouth every evening.       . diltiazem (CARDIZEM CD) 240 MG 24 hr capsule Take 240 mg by mouth daily.      . flecainide (TAMBOCOR) 100 MG tablet Take 1 tablet (100 mg total) by mouth 2 (two) times daily.  60 tablet  6  . levothyroxine (SYNTHROID, LEVOTHROID) 125 MCG tablet Take 125 mcg by mouth daily before breakfast.       . Rivaroxaban (XARELTO) 20 MG TABS tablet Take 20 mg by mouth daily.       No current facility-administered medications for this visit.   Facility-Administered Medications Ordered in Other Visits  Medication Dose Route Frequency Provider Last Rate Last Dose  . ceFAZolin (ANCEF) IVPB 2 g/50 mL premix  2 g Intravenous On Call [name], MD        Allergies  Allergen Reactions  . Codeine     unknown    History   Social History  . Marital Status: Married    Spouse Name: [name]    Number of Children: 2  . Years of Education: [name]   Occupational History  .  DRIVER Marco Morris   Social History Morris Topics  . Smoking status: Former Smoker -- 1.00 packs/day for 14 years    Types: Cigarettes    Quit date: 05/15/1985  . Smokeless tobacco: Former Systems developer    Quit date: 05/15/1985  . Alcohol Use: Yes  . Drug Use: No  . Sexual Activity: Not on file   Other Topics Concern  . 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    Family History  Problem Relation Age of Onset  . Heart attack Mother   . Stroke Mother   . Diabetes Mother   . Cancer - Ovarian Mother   . Leukemia Mother   . Heart attack Father   . Stroke Father   . Diabetes Father   . Cancer - Other Father   . Hypertension Brother     ROS- All systems are reviewed and  negative except as per the HPI above  Physical Exam: Filed Vitals:   07/16/13 0858  BP: 132/84  Pulse: 73  Height: 6' (1.829 m)  Weight: 190 lb (86.183 kg)    GEN- The patient is well appearing, alert and oriented x 3 today.   Head- normocephalic, atraumatic Eyes-  Sclera clear, conjunctiva pink Ears- hearing intact Oropharynx- clear Neck- supple, no JVP Lymph- no cervical lymphadenopathy Lungs- Clear to ausculation bilaterally, normal work of breathing Heart- Regular rate and rhythm, no murmurs, rubs or gallops, PMI not laterally displaced GI- soft, NT, ND, + BS Extremities- no clubbing, cyanosis, or edema MS- no significant deformity or atrophy Skin- no rash or lesion Psych- euthymic mood, full affect Neuro- strength and sensation are intact  EKG today reveals sinus rhythm 73 bpm, PR 164, QTc 436, otherwise normal ekg Echo is reviewed and normal Event monitor is reviewed (more than 50 pages) as described above Dr Morrison Old records are also reviewed  Assessment and Plan:  1. The patient has symptomatic paroxysmal atrial fibrillation and atrial flutter.  He has failed antiarrhythmic drug therapy with flecainide.  He has post termination pauses which he finds concerning.  Therapeutic strategies for afib including medicine and ablation were discussed in detail with the patient today.  As he drives a large truck, I am very concerned about post termination pauses and his risks of syncope.  Unfortunately, I cannot be confident that with ablation that this risk would be sufficiently reduced.  I really think that pacing to prevent pauses is the best strategy for this patient.  I think that a leadless pacemaker would be a very good option.  We could then switch his AAD from flecainide to tikosyn.  If he has further atrial arrhythmias on tikosyn then ablation would be the best next strategy. Risks, benefits, alternatives to leadless and transvenous pacemaker implantation were discussed in  detail with the patient today. The patient understands that the risks include but are not limited to bleeding, infection, pneumothorax, perforation, tamponade, vascular damage, renal failure, MI, stroke, death,  and lead dislodgement and wishes to proceed.  He would prefer a leadless device.  I will have our research team contact him. We will schedule the procedure at the next available time. chads2vasc score is 0 however he has a h/o hypercoagulability (per report).  He will therefore continue xarelto at this time.  2. Tachycardia/ bradycardia syndrome Pacemaker is recommended as above Initiation of tikosyn after PPM Better rate control can be acheived with medical therapy after leadless pacemaker has been implanted.

## 2013-07-18 NOTE — Care Management Note (Signed)
    Page 1 of 1   07/21/2013     11:38:16 AM   CARE MANAGEMENT NOTE 07/21/2013  Patient:  Marco Morris, Marco Morris   Account Number:  [id number]  Date Initiated:  07/18/2013  Documentation initiated by:  GRAVES-BIGELOW,Gatlin Kittell  Subjective/Objective Assessment:   Pt admitted for PPM and plan for tikosyn initiation.     Action/Plan:   Benefits check in process for medication. Will make pt aware once completed.   Anticipated DC Date:  07/21/2013   Anticipated DC Plan:  [facility]  CM consult      Choice offered to / List presented to:             Status of service:  Completed, signed off Medicare Important Message given?   (If response is "NO", the following Medicare IM given date fields will be blank) Date Medicare IM given:   Date Additional Medicare IM given:    Discharge Disposition:  HOME/SELF CARE  Per UR Regulation:  Reviewed for med. necessity/level of care/duration of stay  If discussed at [place] of Stay Meetings, dates discussed:    Comments:   07-21-13 [place], RN, BSN [phone] Pt uses [facility]. Pt is aware of co pay cost. No further needs from CM at this time.    TIKOSYN IS COVERED, NO AUTH REQUIRED, TIER 2, CO-PAY $35.00, NO DEDUCTIBLE, NO OUT OF POCKET, AND NO MAX. MD please write Rx for 7 day supply no refills and then original Rx with refills. No further needs from CM at this time. 07-18-13 [address] RN,BSN [phone]

## 2013-07-18 NOTE — Op Note (Signed)
SURGEON: [name], MD  Assistant: Cristopher Peru, MD   PREPROCEDURE DIAGNOSIS: 1. 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 Paroxysmal Atrial fibrillation   POSTPROCEDURE DIAGNOSIS:  1. 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 Paroxysmal Atrial fibrillation   PROCEDURES:  1. Right Ventriculogram.  2. Leadless Pacemaker implantation.   INTRODUCTION:  Marco Morris is a 58 y.o. male with a history of symptomatic tachycardia bradycardia syndrome, sick sinus syndrome with symptomatic 4 second pauses, and atrial fibrillation who presents today for pacemaker implantation. The patient therefore presents today for pacemaker implantation.   DESCRIPTION OF PROCEDURE: Informed written consent was obtained, and the patient was brought to the electrophysiology lab in a fasting state. The patient received IV Versed and Fentanyl as sedation for the procedure today. Using a percutaneous Seldinger technique, an 8-French was placed into the right common femoral vein.  A venogram of the right ventricle was performed by hand injection of nonionic contrast. This demonstrated a rather normal appearing right ventricle. The 8 french sheath was exchanged for an 18 french sheath. A [employer] Nanostim Leadless Cardiac Pacemaker model [id number] ([id number] [id number]) was advanced through the right femoral vein into the right ventricular apex position and actively fixed to the myocardial wall along a septal location.  In this location, R waves measured 10 mV with an impedance of 700 Ohms and a threshold of 0.5V@0 .79msec. Stability of the device was demonstrated with a "tug test". The device was disconnected from the implantation apparatus which was then removed from the body and the sheaths were aspirated and flushed. The sheaths were removed and hemostasis was assured. There were no early apparent complications.   CONCLUSIONS:  1.Successful  implantation of a SJM nannostim VVI investigational pacemaker.  2. No early apparent complications.   Jeneen Rinks Nekhi Liwanag,MD 12:44 PM 07/18/2013

## 2013-07-18 NOTE — Interval H&P Note (Signed)
History and Physical Interval Note:  07/18/2013 11:03 AM  Marco Morris  has presented today for surgery, with the diagnosis of afib  The various methods of treatment have been discussed with the patient and family. After consideration of risks, benefits and other options for treatment, the patient has consented to  Procedure(s): PERMANENT PACEMAKER INSERTION ([name]) as a surgical intervention .  The patient's history has been reviewed, patient examined, no change in status, stable for surgery.  I have reviewed the patient's chart and labs.  Questions were answered to the patient's satisfaction.     [name]

## 2013-07-18 NOTE — Progress Notes (Deleted)
Entered in error

## 2013-07-19 ENCOUNTER — Other Ambulatory Visit: Payer: Self-pay

## 2013-07-19 ENCOUNTER — Inpatient Hospital Stay (HOSPITAL_COMMUNITY): Payer: Managed Care, Other (non HMO)

## 2013-07-19 DIAGNOSIS — I495 Sick sinus syndrome: Principal | ICD-10-CM

## 2013-07-19 LAB — BASIC METABOLIC PANEL
BUN: 16 mg/dL (ref 6–23)
CALCIUM: 9.8 mg/dL (ref 8.4–10.5)
CO2: 23 mEq/L (ref 19–32)
Chloride: 101 mEq/L (ref 96–112)
Creatinine, Ser: 1.37 mg/dL — ABNORMAL HIGH (ref 0.50–1.35)
GFR calc Af Amer: 64 mL/min — ABNORMAL LOW (ref >90)
GFR, EST NON AFRICAN AMERICAN: 55 mL/min — AB (ref >90)
GLUCOSE: 107 mg/dL — AB (ref 70–99)
Potassium: 4.2 mEq/L (ref 3.7–5.3)
Sodium: 138 mEq/L (ref 137–147)

## 2013-07-19 LAB — MAGNESIUM: Magnesium: 1.9 mg/dL (ref 1.5–2.5)

## 2013-07-19 MED ORDER — SODIUM CHLORIDE 0.9 % IV SOLN: 250.0000 mL | INTRAVENOUS | Status: DC | PRN

## 2013-07-19 MED ORDER — SODIUM CHLORIDE 0.9 % IJ SOLN: 3.0000 mL | INTRAMUSCULAR | Status: DC | PRN

## 2013-07-19 MED ORDER — DOFETILIDE 500 MCG PO CAPS
500.0000 ug | ORAL_CAPSULE | Freq: Two times a day (BID) | ORAL | Status: DC
  Administered 2013-07-19 – 2013-07-22 (×7): 500 ug via ORAL
  Filled 2013-07-19 (×8): qty 1

## 2013-07-19 MED ORDER — SODIUM CHLORIDE 0.9 % IJ SOLN
3.0000 mL | Freq: Two times a day (BID) | INTRAMUSCULAR | Status: DC
Start: 2013-07-19 — End: 2013-07-22
  Administered 2013-07-19 – 2013-07-22 (×7): 3 mL via INTRAVENOUS

## 2013-07-19 NOTE — Progress Notes (Signed)
Pharmacy Consult for Dofetilide (Tikosyn) Iniation  Admit Complaint: 58 y.o. male admitted 07/18/2013 with atrial fibrillation to be initiated on dofetilide.   Assessment:  Patient Exclusion Criteria: If any screening criteria checked as "Yes", then  patient  should NOT receive dofetilide until criteria item is corrected. If "Yes" please indicate correction plan.  YES  NO Patient  Exclusion Criteria Correction Plan  [x]  []  Baseline QTc interval is greater than or equal to 440 msec. IF above YES box checked dofetilide contraindicated unless patient has ICD; then may proceed if QTc 500-550 msec or with known ventricular conduction abnormalities may proceed with QTc 550-600 msec. QTc =  454 MD aware  []  [x]  Magnesium level is less than 1.8 mEq/l : Last magnesium:  Lab Results  Component Value Date   MG 1.9 07/19/2013         []  [x]  Potassium level is less than 4 mEq/l : Last potassium:  Lab Results  Component Value Date   K 4.2 07/19/2013         []  []  Patient is known or suspected to have a digoxin level greater than 2 ng/ml: No results found for this basename: DIGOXIN      []  [x]  Creatinine clearance less than 20 ml/min (calculated using Cockcroft-Gault, actual body weight and serum creatinine): Estimated Creatinine Clearance: 65.6 ml/min (by C-G formula based on Cr of 1.37).    []  [x]  Patient has received drugs known to prolong the QT intervals within the last 48 hours(phenothiazines, tricyclics or tetracyclic antidepressants, erythromycin, H-1 antihistamines, cisapride, fluoroquinolones, azithromycin). Drugs not listed above may have an, as yet, undetected potential to prolong the QT interval, updated information on QT prolonging agents is available at this website:QT prolonging agents   []  [x]  Patient received a dose of hydrochlorothiazide (Oretic) alone or in any combination including triamterene (Dyazide, Maxzide) in the last 48 hours.   []  [x]  Patient received a medication known to  increase dofetilide plasma concentrations prior to initial dofetilide dose:    Trimethoprim (Primsol, Proloprim) in the last 36 hours   Verapamil (Calan, Verelan) in the last 36 hours or a sustained release dose in the last 72 hours   Megestrol (Megace) in the last 5 days    Cimetidine (Tagamet) in the last 6 hours   Ketoconazole (Nizoral) in the last 24 hours   Itraconazole (Sporanox) in the last 48 hours    Prochlorperazine (Compazine) in the last 36 hours    []  [x]  Patient is known to have a history of torsades de pointes; congenital or acquired long QT syndromes.   []  [x]  Patient has received a Class 1 antiarrhythmic with less than 2 half-lives since last dose. (Disopyramide, Quinidine, Procainamide, Lidocaine, Mexiletine, Flecainide, Propafenone) Flecainide was stopped 2 days ago   []  [x]  Patient has received amiodarone therapy in the past 3 months or amiodarone level is greater than 0.3 ng/ml.    Patient has been appropriately anticoagulated with Xarelto.  Ordering provider was confirmed at [email or web address] if they are not listed on the [facility] Prescribers list.  Goal of Therapy:  Follow renal function, electrolytes, potential drug interactions, and dose adjustment. Provide education and 1 week supply at discharge.  Plan:  1.  Initiate dofetilide based on renal function: Select One Calculated CrCl  Dose q12h  [x]  > 60 ml/min 500 mcg  []  40-60 ml/min 250 mcg  []  20-40 ml/min 125 mcg   2. Follow up QTc after the first 5 doses, renal function, electrolytes (K &  Mg) daily x 3     days, dose adjustment, success of initiation and facilitate 1 week discharge supply as     clinically indicated.  3. Initiate Tikosyn education video (Call [phone] and ask for video # [phone]).  4. Place Enrollment Form on the chart for discharge supply of dofetilide.   Albertina Parr, PharmD.  Clinical Pharmacist Pager [phone]

## 2013-07-19 NOTE — Progress Notes (Signed)
   SUBJECTIVE: The patient is doing well today.  At this time, he denies chest pain, shortness of breath, or any new concerns.  S/p LCP implantation yesterday - plans for Tikosyn loading today.  K>4, Mg, 1.9, on Xarelto, QTc 454  CURRENT MEDICATIONS: . atorvastatin  20 mg Oral QPM  . diltiazem  240 mg Oral Daily  . levothyroxine  125 mcg Oral QAC breakfast  . Rivaroxaban  20 mg Oral Q supper  . sodium chloride  3 mL Intravenous Q12H      OBJECTIVE: Physical Exam: Filed Vitals:   07/18/13 1434 07/18/13 1630 07/18/13 2000 07/19/13 0555  BP: 134/86 111/68 119/60 119/60  Pulse: 62  72 63  Temp: 97.8 F (36.6 C)  97.5 F (36.4 C) 97.6 F (36.4 C)  TempSrc: Oral  Oral Oral  Resp: 20  18 20   Height:      Weight:      SpO2: 95%  100% 97%    Intake/Output Summary (Last 24 hours) at 07/19/13 0721 Last data filed at 07/19/13 0556  Gross per 24 hour  Intake      3 ml  Output    300 ml  Net   -297 ml    Telemetry reveals sinus rhythm with intermittent runs of afib, occasional ventricular pacing  GEN- The patient is well appearing, alert and oriented x 3 today.   Head- normocephalic, atraumatic Eyes-  Sclera clear, conjunctiva pink Ears- hearing intact Oropharynx- clear Neck- supple, no JVP Lymph- no cervical lymphadenopathy Lungs- Clear to ausculation bilaterally, normal work of breathing Heart- Regular rate and rhythm, no murmurs, rubs or gallops, PMI not laterally displaced GI- soft, NT, ND, + BS Extremities- no clubbing, cyanosis, or edema Skin- no rash or lesion Psych- euthymic mood, full affect Neuro- strength and sensation are intact  LABS: Basic Metabolic Panel:  Recent Labs  07/16/13 1308 07/19/13 0519  NA 138 138  K 3.6 4.2  CL 103 101  CO2 29 23  GLUCOSE 125* 107*  BUN 11 16  CREATININE 1.3 1.37*  CALCIUM 9.2 9.8  MG  --  1.9   CBC:  Recent Labs  07/16/13 1308  WBC 7.3  NEUTROABS 4.5  HGB 14.1  HCT 43.0  MCV 89.3  PLT 175.0     RADIOLOGY: Final result pending - pacemaker in stable position  ASSESSMENT AND PLAN:  Active Problems:   Paroxysmal a-fib   Atrial flutter   Tachycardia-bradycardia   Atrial fibrillation   Right groin pain  EP Attending  Patient seen and examined. Agree with above. His QT is on borderline. Will Start Tikosyn. PPM position is good. Await device interogation. The patient will be in the hospital through the weekend.  Marco Morris.D.

## 2013-07-19 NOTE — Discharge Instructions (Addendum)
DO NOT start any new medications (over-the-counter or prescription) without notifying Dr. Rayann Heman first. _____________________________________________________________________________________ Information on my medicine - XARELTO (Rivaroxaban)  This medication education was reviewed with me or my healthcare representative as part of my discharge preparation.  The pharmacist that spoke with me during my hospital stay was:  [facility] Arlyss Repress, [facility]  Why was Xarelto prescribed for you? Xarelto was prescribed for you to reduce the risk of a blood clot forming that can cause a stroke if you have a medical condition called atrial fibrillation (a type of irregular heartbeat).  What do you need to know about xarelto ? Take your Xarelto ONCE DAILY at the same time every day with your evening meal. If you have difficulty swallowing the tablet whole, you may crush it and mix in applesauce just prior to taking your dose.  Take Xarelto exactly as prescribed by your doctor and DO NOT stop taking Xarelto without talking to the doctor who prescribed the medication.  Stopping without other stroke prevention medication to take the place of Xarelto may increase your risk of developing a clot that causes a stroke.  Refill your prescription before you run out.  After discharge, you should have regular check-up appointments with your healthcare provider that is prescribing your Xarelto.  In the future your dose may need to be changed if your kidney function or weight changes by a significant amount.  What do you do if you miss a dose? If you are taking Xarelto ONCE DAILY and you miss a dose, take it as soon as you remember on the same day then continue your regularly scheduled once daily regimen the next day. Do not take two doses of Xarelto at the same time or on the same day.   Important Safety Information A possible side effect of Xarelto is bleeding. You should call your healthcare provider  right away if you experience any of the following:   Bleeding from an injury or your nose that does not stop.   Unusual colored urine (red or dark brown) or unusual colored stools (red or black).   Unusual bruising for unknown reasons.   A serious fall or if you hit your head (even if there is no bleeding).  Some medicines may interact with Xarelto and might increase your risk of bleeding while on Xarelto. To help avoid this, consult your healthcare provider or pharmacist prior to using any new prescription or non-prescription medications, including herbals, vitamins, non-steroidal anti-inflammatory drugs (NSAIDs) and supplements.  This website has more information on Xarelto: [email or web address]

## 2013-07-20 DIAGNOSIS — I4891 Unspecified atrial fibrillation: Secondary | ICD-10-CM

## 2013-07-20 LAB — BASIC METABOLIC PANEL
BUN: 14 mg/dL (ref 6–23)
CALCIUM: 9.4 mg/dL (ref 8.4–10.5)
CO2: 25 meq/L (ref 19–32)
CREATININE: 1.26 mg/dL (ref 0.50–1.35)
Chloride: 103 mEq/L (ref 96–112)
GFR, EST AFRICAN AMERICAN: 70 mL/min — AB (ref >90)
GFR, EST NON AFRICAN AMERICAN: 61 mL/min — AB (ref >90)
Glucose, Bld: 104 mg/dL — ABNORMAL HIGH (ref 70–99)
Potassium: 4.6 mEq/L (ref 3.7–5.3)
SODIUM: 141 meq/L (ref 137–147)

## 2013-07-20 LAB — MAGNESIUM: MAGNESIUM: 2.1 mg/dL (ref 1.5–2.5)

## 2013-07-20 NOTE — Progress Notes (Signed)
Patient ID: DSHAWN MCNAY, male   DOB: [birth year], 58 y.o.   MRN: [id number]   Patient Name: Marco Morris Date of Encounter: 07/20/2013     Active Problems:   Paroxysmal a-fib   Atrial flutter   Tachycardia-bradycardia   Atrial fibrillation   Right groin pain    SUBJECTIVE No chest pain or sob. Maintaining NSR  CURRENT MEDS . atorvastatin  20 mg Oral QPM  . diltiazem  240 mg Oral Daily  . dofetilide  500 mcg Oral Q12H  . levothyroxine  125 mcg Oral QAC breakfast  . Rivaroxaban  20 mg Oral Q supper  . sodium chloride  3 mL Intravenous Q12H  . sodium chloride  3 mL Intravenous Q12H    OBJECTIVE  Filed Vitals:   07/19/13 1137 07/19/13 1700 07/19/13 2058 07/20/13 0516  BP: 122/80  117/72 119/74  Pulse: 60  56 71  Temp: 98 F (36.7 C)  98 F (36.7 C) 97.9 F (36.6 C)  TempSrc: Oral  Oral Oral  Resp: 20  16 15   Height:  6\' 1"  (1.854 m)    Weight:  190 lb (86.183 kg)    SpO2: 98%  98% 99%    Intake/Output Summary (Last 24 hours) at 07/20/13 0954 Last data filed at 07/19/13 2134  Gross per 24 hour  Intake   1066 ml  Output    250 ml  Net    816 ml   Filed Weights   07/18/13 0730 07/19/13 1700  Weight: 190 lb (86.183 kg) 190 lb (86.183 kg)    PHYSICAL EXAM  General: Pleasant, NAD. Neuro: Alert and oriented X 3. Moves all extremities spontaneously. Psych: Normal affect. HEENT:  Normal  Neck: Supple without bruits or JVD. Lungs:  Resp regular and unlabored, CTA. Heart: RRR no s3, s4, or murmurs. Abdomen: Soft, non-tender, non-distended, BS + x 4.  Extremities: No clubbing, cyanosis or edema. DP/PT/Radials 2+ and equal bilaterally.  Accessory Clinical Findings  CBC No results found for this basename: WBC, NEUTROABS, HGB, HCT, MCV, PLT,  in the last 72 hours Basic Metabolic Panel  Recent Labs  07/19/13 0519 07/20/13 0613  NA 138 141  K 4.2 4.6  CL 101 103  CO2 23 25  GLUCOSE 107* 104*  BUN 16 14  CREATININE 1.37* 1.26  CALCIUM 9.8 9.4  MG  1.9 2.1   Liver Function Tests No results found for this basename: AST, ALT, ALKPHOS, BILITOT, PROT, ALBUMIN,  in the last 72 hours No results found for this basename: LIPASE, AMYLASE,  in the last 72 hours Cardiac Enzymes No results found for this basename: CKTOTAL, CKMB, CKMBINDEX, TROPONINI,  in the last 72 hours BNP No components found with this basename: POCBNP,  D-Dimer No results found for this basename: DDIMER,  in the last 72 hours Hemoglobin A1C No results found for this basename: HGBA1C,  in the last 72 hours Fasting Lipid Panel No results found for this basename: CHOL, HDL, LDLCALC, TRIG, CHOLHDL, LDLDIRECT,  in the last 72 hours Thyroid Function Tests No results found for this basename: TSH, T4TOTAL, FREET3, T3FREE, THYROIDAB,  in the last 72 hours  TELE SR  ECG - NSR, QTC 440  Radiology/Studies  Dg Chest 2 View  07/19/2013   CLINICAL DATA:  Status post leadless pacemaker implantation for tachycardia bradycardia syndrome.  EXAM: CHEST - 2 VIEW  COMPARISON:  DG CHEST 2 VIEW dated 06/05/2013  FINDINGS: There is interval implantation of a leadless pacing device visualized in  the right ventricle. Lungs are clear and show normal expansion without evidence of edema, infiltrate or pneumothorax. No pleural fluid is identified. The heart size and mediastinal contours are stable and within normal limits.  IMPRESSION: Leadless pacing device identified in the right ventricle. No complications are evident.   Electronically Signed   By: Aletta Edouard M.D.   On: 07/19/2013 08:55    ASSESSMENT AND PLAN 1.Atrial fib/flutter 2. Tikosyn loading Rec: continue Tikosyn and follow QT intervals.  Kalyani Maeda,M.D.  07/20/2013 9:54 AM

## 2013-07-21 LAB — BASIC METABOLIC PANEL
BUN: 15 mg/dL (ref 6–23)
CO2: 26 mEq/L (ref 19–32)
CREATININE: 1.18 mg/dL (ref 0.50–1.35)
Calcium: 9.5 mg/dL (ref 8.4–10.5)
Chloride: 103 mEq/L (ref 96–112)
GFR calc Af Amer: 76 mL/min — ABNORMAL LOW (ref >90)
GFR calc non Af Amer: 66 mL/min — ABNORMAL LOW (ref >90)
Glucose, Bld: 102 mg/dL — ABNORMAL HIGH (ref 70–99)
Potassium: 4.2 mEq/L (ref 3.7–5.3)
SODIUM: 142 meq/L (ref 137–147)

## 2013-07-21 LAB — MAGNESIUM: Magnesium: 2 mg/dL (ref 1.5–2.5)

## 2013-07-21 MED ORDER — DILTIAZEM HCL ER COATED BEADS 300 MG PO CP24
300.0000 mg | ORAL_CAPSULE | Freq: Every day | ORAL | Status: DC
  Administered 2013-07-21: 300 mg via ORAL
  Filled 2013-07-21 (×2): qty 1

## 2013-07-21 NOTE — Discharge Summary (Signed)
ELECTROPHYSIOLOGY PROCEDURE DISCHARGE SUMMARY    Patient ID: Marco Morris,  MRN: [id number], DOB/AGE: Oct 31, 1955 58 y.o.  Admit date: 07/18/2013 Discharge date: 07/22/2013  Primary Care Physician: Jilda Panda, MD Primary Cardiologist: [place] Electrophysiologist: Breanne Olvera  Primary Discharge Diagnosis:  1. 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  Paroxysmal atrial fibrillation status post Tikosyn loading this admission  Secondary Discharge Diagnosis:  1.  Hyperlipidemia 2.  Hypothyroidism    Allergies  Allergen Reactions  . Codeine     unknown     Procedures This Admission:  1.  Implantation of a VVI pacemaker on 07-18-13 by Dr Rayann Heman.  The patient received a STJ Nanostim leadless cardiac pacemaker placed in the right ventricle.  There were no early apparent complications. 2.  CXR on 07-19-13 demonstrated no procedure related complications with the device in stable position.  3.  Tikosyn loading  Brief HPI: Marco Morris is a 58 y.o. male with a history of symptomatic tachycardia bradycardia syndrome, sick sinus syndrome with symptomatic 4 second pauses, and atrial fibrillation.  He has failed medical therapy with Flecainide.  He is symptomatic with his atrial fibrillation with fatigue and shortness of breath.  He was evaluated in the outpatient setting by Dr Rayann Heman and VVI pacemaker implantation as well as Tikosyn loading were recommended. Risks, benefits, and alternatives were reviewed with the patient who wished to proceed.   Hospital Course:  The patient was admitted and underwent implantation of a STJ Leadless pacemaker on 07-18-13 by Dr Rayann Heman.  He was monitored on telemetry overnight which demonstrated sinus rhythm with intermittent sinus pauses and ventricular pacing.  CXR was obtained and without complication.  His device was interrogated and found to be functioning normally.  His groin  was without complication.  After Flecainide wash-out, he was started on Tikosyn 07-19-13.  His electrolytes and QTc interval remained stable.  Telemetry demonstrated sinus rhythm with rare nonsustained atrial fibrillation with occasional ventricular pacing. He was evaluated by Dr Rayann Heman on 07-22-13 and considered stable for discharge to home.  He will have followup BMET, Mg, EKG as well as wound check in 1 week.   Discharge Vitals: Blood pressure 117/70, pulse 58, temperature 98.3 F (36.8 C), temperature source Oral, resp. rate 20, height 6\' 1"  (1.854 m), weight 190 lb (86.183 kg), SpO2 98.00%.    Labs:   Lab Results  Component Value Date   WBC 7.3 07/16/2013   HGB 14.1 07/16/2013   HCT 43.0 07/16/2013   MCV 89.3 07/16/2013   PLT 175.0 07/16/2013     Recent Labs Lab 07/22/13 0534  NA 141  K 4.4  CL 103  CO2 24  BUN 16  CREATININE 1.16  CALCIUM 9.6  GLUCOSE 105*    Discharge Medications:    Medication List    STOP taking these medications       flecainide 100 MG tablet  Commonly known as:  TAMBOCOR      TAKE these medications       atorvastatin 20 MG tablet  Commonly known as:  LIPITOR  Take 20 mg by mouth every evening.     diltiazem 240 MG 24 hr capsule  Commonly known as:  CARDIZEM CD  Take 240 mg by mouth daily.     diltiazem 120 MG 24 hr capsule  Commonly known as:  CARDIZEM CD  Take 1 capsule (120 mg total) by mouth daily. To take with diltiazem CD 240  mg once daily (to total 360 mg once daily) as directed by Dr. Rayann Heman.     dofetilide 500 MCG capsule  Commonly known as:  TIKOSYN  Take 1 capsule (500 mcg total) by mouth every 12 (twelve) hours.     levothyroxine 125 MCG tablet  Commonly known as:  SYNTHROID, LEVOTHROID  Take 125 mcg by mouth daily before breakfast.     Rivaroxaban 20 MG Tabs tablet  Commonly known as:  XARELTO  Take 1 tablet (20 mg total) by mouth daily.        Disposition:  Discharge Orders   Future Appointments Provider Department Dept  Phone   07/29/2013 11:00 AM [facility] Nurse [place] [facility] [phone]   07/29/2013 11:10 AM [facility] Lab [place] Office [phone]   07/29/2013 11:30 AM [facility] Device [place] Office [phone]   08/11/2013 1:45 PM [name], MD [place] Office [phone]   08/18/2013 9:00 AM Peter M [place], MD [place] Office [phone]   10/20/2013 9:45 AM [name], MD [facility] [phone]   Future Orders Complete By Expires   Diet - low sodium heart healthy  As directed    Increase activity slowly  As directed      Follow-up Information   Follow up with [facility] Office On 07/29/2013. (At 11:00 AM for labs and ECG)    Specialty:  Cardiology   Contact information:   [address] [place] [address] [phone]      Follow up with [name], MD On 08/11/2013. (At 1:45 PM)    Specialty:  Cardiology   Contact information:   [address] [place] [address] [phone]       Duration of Discharge Encounter: Greater than 30 minutes including physician time.  Signed,  [name] MD

## 2013-07-21 NOTE — Progress Notes (Signed)
7 beats of v-tach noted on telemetry monitor.  Patient asymptomatic and sleeping.  Electrolytes WNL on today's am labs.  Jules Husbands notified.  Will continue to monitor.  Jodell Cipro

## 2013-07-21 NOTE — Progress Notes (Signed)
   SUBJECTIVE: The patient is doing well today.  At this time, he denies chest pain, shortness of breath, or any new concerns.  Marland Kitchen atorvastatin  20 mg Oral QPM  . diltiazem  300 mg Oral Daily  . dofetilide  500 mcg Oral Q12H  . levothyroxine  125 mcg Oral QAC breakfast  . Rivaroxaban  20 mg Oral Q supper  . sodium chloride  3 mL Intravenous Q12H  . sodium chloride  3 mL Intravenous Q12H      OBJECTIVE: Physical Exam: Filed Vitals:   07/20/13 1016 07/20/13 1625 07/20/13 2100 07/21/13 0537  BP: 127/68 120/75 137/78 111/87  Pulse:  57 62 64  Temp:  97.9 F (36.6 C) 98.5 F (36.9 C) 98 F (36.7 C)  TempSrc:  Oral Oral Oral  Resp:  16 16 16   Height:      Weight:      SpO2:  92% 96% 97%   No intake or output data in the 24 hours ending 07/21/13 0834  Telemetry reveals sinus rhythm with rare nonsustained afib  GEN- The patient is well appearing, alert and oriented x 3 today.   Head- normocephalic, atraumatic Eyes-  Sclera clear, conjunctiva pink Ears- hearing intact Oropharynx- clear Neck- supple, no JVP Lymph- no cervical lymphadenopathy Lungs- Clear to ausculation bilaterally, normal work of breathing Heart- Regular rate and rhythm, no murmurs, rubs or gallops, PMI not laterally displaced GI- soft, NT, ND, + BS Extremities- no clubbing, cyanosis, or edema Neuro- strength and sensation are intact  LABS: Basic Metabolic Panel:  Recent Labs  07/19/13 0519 07/20/13 0613  NA 138 141  K 4.2 4.6  CL 101 103  CO2 23 25  GLUCOSE 107* 104*  BUN 16 14  CREATININE 1.37* 1.26  CALCIUM 9.8 9.4  MG 1.9 2.1   RADIOLOGY: Dg Chest 2 View  07/19/2013   CLINICAL DATA:  Status post leadless pacemaker implantation for tachycardia bradycardia syndrome.  EXAM: CHEST - 2 VIEW  COMPARISON:  DG CHEST 2 VIEW dated 06/05/2013  FINDINGS: There is interval implantation of a leadless pacing device visualized in the right ventricle. Lungs are clear and show normal expansion without evidence  of edema, infiltrate or pneumothorax. No pleural fluid is identified. The heart size and mediastinal contours are stable and within normal limits.  IMPRESSION: Leadless pacing device identified in the right ventricle. No complications are evident.   Electronically Signed   By: Aletta Edouard M.D.   On: 07/19/2013 08:55    ASSESSMENT AND PLAN:  Active Problems:   Paroxysmal a-fib   Atrial flutter   Tachycardia-bradycardia   Atrial fibrillation   Right groin pain  1. afib Doing well on tikosyn qt is stable Increase diltiazem to 300mg  daily today Continue anticoagulation  2. Tachy/brady Doing well s/p leadless pacemaker  No changes today Anticipate discharge tomorrow   [name], MD 07/21/2013 8:34 AM

## 2013-07-22 ENCOUNTER — Encounter (HOSPITAL_COMMUNITY): Payer: Self-pay | Admitting: *Deleted

## 2013-07-22 ENCOUNTER — Other Ambulatory Visit: Payer: Self-pay | Admitting: *Deleted

## 2013-07-22 LAB — BASIC METABOLIC PANEL
BUN: 16 mg/dL (ref 6–23)
BUN: 16 mg/dL (ref 6–23)
CHLORIDE: 100 meq/L (ref 96–112)
CHLORIDE: 103 meq/L (ref 96–112)
CO2: 24 mEq/L (ref 19–32)
CO2: 29 meq/L (ref 19–32)
CREATININE: 1.28 mg/dL (ref 0.50–1.35)
Calcium: 9.5 mg/dL (ref 8.4–10.5)
Calcium: 9.6 mg/dL (ref 8.4–10.5)
Creatinine, Ser: 1.16 mg/dL (ref 0.50–1.35)
GFR calc Af Amer: 69 mL/min — ABNORMAL LOW (ref >90)
GFR calc non Af Amer: 60 mL/min — ABNORMAL LOW (ref >90)
GFR calc non Af Amer: 67 mL/min — ABNORMAL LOW (ref >90)
GFR, EST AFRICAN AMERICAN: 78 mL/min — AB (ref >90)
GLUCOSE: 105 mg/dL — AB (ref 70–99)
Glucose, Bld: 102 mg/dL — ABNORMAL HIGH (ref 70–99)
POTASSIUM: 4.4 meq/L (ref 3.7–5.3)
Potassium: 3.8 mEq/L (ref 3.7–5.3)
Sodium: 141 mEq/L (ref 137–147)
Sodium: 141 mEq/L (ref 137–147)

## 2013-07-22 LAB — MAGNESIUM
MAGNESIUM: 2 mg/dL (ref 1.5–2.5)
Magnesium: 2 mg/dL (ref 1.5–2.5)

## 2013-07-22 MED ORDER — RIVAROXABAN 20 MG PO TABS: 20.0000 mg | ORAL_TABLET | Freq: Every day | ORAL | Status: DC

## 2013-07-22 MED ORDER — DOFETILIDE 500 MCG PO CAPS: 500.0000 ug | ORAL_CAPSULE | Freq: Two times a day (BID) | ORAL | Status: DC

## 2013-07-22 MED ORDER — DILTIAZEM HCL ER COATED BEADS 120 MG PO CP24: 120.0000 mg | ORAL_CAPSULE | Freq: Every day | ORAL | Status: DC

## 2013-07-22 MED ORDER — DILTIAZEM HCL ER COATED BEADS 360 MG PO CP24
360.0000 mg | ORAL_CAPSULE | Freq: Every day | ORAL | Status: DC
  Administered 2013-07-22: 360 mg via ORAL
  Filled 2013-07-22: qty 1

## 2013-07-22 MED ORDER — POTASSIUM CHLORIDE CRYS ER 20 MEQ PO TBCR
20.0000 meq | EXTENDED_RELEASE_TABLET | Freq: Once | ORAL | Status: AC
  Administered 2013-07-22: 20 meq via ORAL
  Filled 2013-07-22: qty 1

## 2013-07-22 NOTE — Progress Notes (Signed)
   SUBJECTIVE: The patient is doing well today.  At this time, he denies chest pain, shortness of breath, or any new concerns.  CURRENT MEDICATIONS: . atorvastatin  20 mg Oral QPM  . diltiazem  300 mg Oral Daily  . dofetilide  500 mcg Oral Q12H  . levothyroxine  125 mcg Oral QAC breakfast  . Rivaroxaban  20 mg Oral Q supper  . sodium chloride  3 mL Intravenous Q12H  . sodium chloride  3 mL Intravenous Q12H      OBJECTIVE: Physical Exam: Filed Vitals:   07/21/13 0537 07/21/13 1353 07/21/13 2126 07/22/13 0547  BP: 111/87 137/77 126/75 133/82  Pulse: 64 64 62 62  Temp: 98 F (36.7 C) 98.1 F (36.7 C) 96.8 F (36 C) 98.8 F (37.1 C)  TempSrc: Oral Oral Oral Oral  Resp: 16 17 18 18   Height:      Weight:      SpO2: 97% 96% 99% 96%    Intake/Output Summary (Last 24 hours) at 07/22/13 0616 Last data filed at 07/21/13 1300  Gross per 24 hour  Intake    960 ml  Output      0 ml  Net    960 ml    Telemetry reveals sinus rhythm with rare nonsustained afib  GEN- The patient is well appearing, alert and oriented x 3 today.   Head- normocephalic, atraumatic Eyes-  Sclera clear, conjunctiva pink Ears- hearing intact Oropharynx- clear Neck- supple, no JVP Lymph- no cervical lymphadenopathy Lungs- Clear to ausculation bilaterally, normal work of breathing Heart- Regular rate and rhythm, no murmurs, rubs or gallops, PMI not laterally displaced GI- soft, NT, ND, + BS Extremities- no clubbing, cyanosis, or edema Neuro- strength and sensation are intact  LABS: Basic Metabolic Panel:  Recent Labs  07/21/13 0755 07/22/13 0030  NA 142 141  K 4.2 3.8  CL 103 100  CO2 26 29  GLUCOSE 102* 102*  BUN 15 16  CREATININE 1.18 1.28  CALCIUM 9.5 9.5  MG 2.0 2.0   RADIOLOGY: Dg Chest 2 View 07/19/2013   CLINICAL DATA:  Status post leadless pacemaker implantation for tachycardia bradycardia syndrome.  EXAM: CHEST - 2 VIEW  COMPARISON:  DG CHEST 2 VIEW dated 06/05/2013  FINDINGS:  There is interval implantation of a leadless pacing device visualized in the right ventricle. Lungs are clear and show normal expansion without evidence of edema, infiltrate or pneumothorax. No pleural fluid is identified. The heart size and mediastinal contours are stable and within normal limits.  IMPRESSION: Leadless pacing device identified in the right ventricle. No complications are evident.   Electronically Signed   By: Aletta Edouard M.D.   On: 07/19/2013 08:55    ASSESSMENT AND PLAN:  Active Problems:   Paroxysmal a-fib   Atrial flutter   Tachycardia-bradycardia   Atrial fibrillation   Right groin pain  1. afib Doing well on tikosyn qt is stable Increase diltiazem to 360mg  daily today Continue anticoagulation  2. Tachy/brady Doing well s/p leadless pacemaker  DC to home today No driving until wound check  Follow-up with me in 3-4 weeks

## 2013-07-22 NOTE — Progress Notes (Signed)
6 beat run of v-tach noted on telemetry monitor.  BMET and Mag ordered per Jules Husbands.  Will continue to monitor.  Jodell Cipro

## 2013-07-22 NOTE — Progress Notes (Signed)
K+=3.8.  Jules Husbands notified.  Order received for KCl 20 mEq po x1. 0259-KCl given per order.  Will continue to monitor.  Jodell Cipro

## 2013-07-23 ENCOUNTER — Telehealth: Payer: Self-pay | Admitting: Internal Medicine

## 2013-07-23 NOTE — Telephone Encounter (Signed)
New message         Pt would like for kelly to give him a call. Pt wouldn't mention the reason.

## 2013-07-23 NOTE — Telephone Encounter (Signed)
Will need a note to return to work when he comes in on 07/29/13 for post hospital visit  Let him know we would give him that at the visit

## 2013-07-29 ENCOUNTER — Encounter: Payer: Self-pay | Admitting: Internal Medicine

## 2013-07-29 ENCOUNTER — Ambulatory Visit (INDEPENDENT_AMBULATORY_CARE_PROVIDER_SITE_OTHER): Payer: Managed Care, Other (non HMO) | Admitting: *Deleted

## 2013-07-29 ENCOUNTER — Encounter: Payer: Self-pay | Admitting: Cardiovascular Disease

## 2013-07-29 ENCOUNTER — Other Ambulatory Visit: Payer: Self-pay

## 2013-07-29 ENCOUNTER — Encounter: Payer: Self-pay | Admitting: *Deleted

## 2013-07-29 DIAGNOSIS — E7800 Pure hypercholesterolemia, unspecified: Secondary | ICD-10-CM

## 2013-07-29 DIAGNOSIS — I4891 Unspecified atrial fibrillation: Secondary | ICD-10-CM

## 2013-07-29 DIAGNOSIS — I495 Sick sinus syndrome: Secondary | ICD-10-CM

## 2013-07-29 DIAGNOSIS — I480 Paroxysmal atrial fibrillation: Secondary | ICD-10-CM

## 2013-07-29 DIAGNOSIS — I4892 Unspecified atrial flutter: Secondary | ICD-10-CM

## 2013-07-29 LAB — MDC_IDC_ENUM_SESS_TYPE_INCLINIC
Brady Statistic RV Percent Paced: 0 %
Lead Channel Impedance Value: 480 Ohm
Lead Channel Pacing Threshold Amplitude: 0.5 V
Lead Channel Pacing Threshold Pulse Width: 0.4 ms
Lead Channel Setting Pacing Amplitude: 3.5 V
MDC IDC MSMT LEADCHNL RV SENSING INTR AMPL: 12 mV
MDC IDC SET LEADCHNL RV PACING PULSEWIDTH: 0.4 ms
MDC IDC SET LEADCHNL RV SENSING SENSITIVITY: 2 mV

## 2013-07-29 LAB — BASIC METABOLIC PANEL
BUN: 16 mg/dL (ref 6–23)
CO2: 30 meq/L (ref 19–32)
Calcium: 9.4 mg/dL (ref 8.4–10.5)
Chloride: 101 mEq/L (ref 96–112)
Creatinine, Ser: 1.2 mg/dL (ref 0.4–1.5)
GFR: 63.8 mL/min (ref >60.00)
Glucose, Bld: 95 mg/dL (ref 70–99)
POTASSIUM: 3.8 meq/L (ref 3.5–5.1)
SODIUM: 137 meq/L (ref 135–145)

## 2013-07-29 LAB — MAGNESIUM: Magnesium: 2 mg/dL (ref 1.5–2.5)

## 2013-07-29 MED ORDER — POTASSIUM CHLORIDE CRYS ER 20 MEQ PO TBCR: 20.0000 meq | EXTENDED_RELEASE_TABLET | Freq: Every day | ORAL | Status: DC

## 2013-07-29 NOTE — Progress Notes (Signed)
Wound check appointment for leadless. Wound without redness or edema. Wound vicinity does have rash---unrelated to catheter incision. Normal device function. Threshold, sensing, and impedance consistent with implant measurements. Device programmed at 3.5V for extra safety margin until 3 month visit. No high ventricular rates noted. Checked by [employer]. ROV w/ Dr. Rayann Heman 09/03/13 @ 10:00.

## 2013-08-11 ENCOUNTER — Encounter: Payer: Managed Care, Other (non HMO) | Admitting: Internal Medicine

## 2013-08-18 ENCOUNTER — Ambulatory Visit (INDEPENDENT_AMBULATORY_CARE_PROVIDER_SITE_OTHER): Payer: Managed Care, Other (non HMO) | Admitting: Cardiology

## 2013-08-18 ENCOUNTER — Encounter: Payer: Self-pay | Admitting: Cardiology

## 2013-08-18 VITALS — BP 138/76 | HR 64 | Ht 73.0 in | Wt 188.0 lb

## 2013-08-18 DIAGNOSIS — E7800 Pure hypercholesterolemia, unspecified: Secondary | ICD-10-CM

## 2013-08-18 DIAGNOSIS — I480 Paroxysmal atrial fibrillation: Secondary | ICD-10-CM

## 2013-08-18 DIAGNOSIS — I495 Sick sinus syndrome: Secondary | ICD-10-CM

## 2013-08-18 DIAGNOSIS — I4891 Unspecified atrial fibrillation: Secondary | ICD-10-CM

## 2013-08-18 NOTE — Progress Notes (Signed)
Marco Morris Date of Birth: [birth year]   History of Present Illness: Marco Morris is seen for follow up atrial fibrillation dating back to 2003. This initially was managed with beta blocker therapy. Due to excessive fatigue he was switched to diltiazem on his last visit. On follow up in Feb. He was not  doing well. He complained of a 2 week history of increased SOB and nonproductive cough. He also complained of more Atrial fibrillation.  He is now on Xarelto for anticoagulation. Event monitor showed multiple episodes of Afib with very rapid ventricular response as well as pauses up to 4 seconds. He was seen by Dr. Rayann Heman and started on Tikosyn in an effort to control his afib. A leadless ventricular pacemaker was placed to treat the pauses. Since these changes were made he states he feels no better. Doesn't have a cough now but still complains of racing and dyspnea- sometimes with minimal exertion or at rest. Also complains of some ankle swelling. He is scheduled to see Dr. Rayann Heman on April 22.   Current Outpatient Prescriptions on File Prior to Visit  Medication Sig Dispense Refill  . atorvastatin (LIPITOR) 20 MG tablet Take 20 mg by mouth every evening.       . diltiazem (CARDIZEM CD) 120 MG 24 hr capsule Take 1 capsule (120 mg total) by mouth daily. To take with diltiazem CD 240 mg once daily (to total 360 mg once daily) as directed by Dr. Rayann Heman.  30 capsule  4  . diltiazem (CARDIZEM CD) 240 MG 24 hr capsule Take 240 mg by mouth daily.      Marland Kitchen dofetilide (TIKOSYN) 500 MCG capsule Take 1 capsule (500 mcg total) by mouth every 12 (twelve) hours.  60 capsule  6  . levothyroxine (SYNTHROID, LEVOTHROID) 125 MCG tablet Take 125 mcg by mouth daily before breakfast.       . potassium chloride SA (K-DUR,KLOR-CON) 20 MEQ tablet Take 1 tablet (20 mEq total) by mouth daily.  30 tablet  6  . Rivaroxaban (XARELTO) 20 MG TABS tablet Take 1 tablet (20 mg total) by mouth daily.  30 tablet  1   No current  facility-administered medications on file prior to visit.    Allergies  Allergen Reactions  . Codeine     unknown    Past Medical History  Diagnosis Date  . PAF (paroxysmal atrial fibrillation)     a.  anticoagulated with Xarelto b. s/p Tikosyn loading 07/2013  . Hypercholesterolemia   . History of renal calculi   . Hypothyroidism   . Dizzy   . Shortness of breath     shortness of breath even at rest  . Blood dyscrasia     high risk for blood clot formation  . Chronic kidney disease     right renal stone   . Neuromuscular disorder     numbness left foot  . 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 Cholecystectomy    . Kidney cyst removal    . Lithotripsy  2004  . Pacemaker insertion  07/18/2013    STJ Leadless pacemaker implanted by Dr Rayann Heman for symptomatic pauses    History  Smoking status  . Former Smoker -- 1.00 packs/day for 14 years  . Types: Cigarettes  . Quit date: 05/15/1985  Smokeless tobacco  . Former Systems developer  . Quit date: 05/15/1985    History  Alcohol Use  . Yes    Comment: OCCASIONAL    Family History  Problem Relation Age of Onset  . Heart attack Mother   . Stroke Mother   . Diabetes Mother   . Cancer - Ovarian Mother   . Leukemia Mother   . Heart attack Father   . Stroke Father   . Diabetes Father   . Cancer - Other Father   . Hypertension Brother     Review of Systems: As noted in history of present illness.  All other systems were reviewed and are negative.  Physical Exam: BP 138/76  Pulse 64  Ht 6\' 1"  (1.854 m)  Wt 188 lb (85.276 kg)  BMI 24.81 kg/m2 He is a well-developed white male in no acute distress. HEENT exam is unremarkable. He has no JVD or bruits. Lungs are clear. Cardiac exam reveals a regular rate and rhythm without gallop, murmur, or click. Abdomen is soft and nontender without mass or bruits.  Extremities are without edema. Pulses are 2+ and symmetric throughout. Skin is warm and dry. Neurologic exam he is alert and oriented x3. Cranial nerves II through XII are intact. He has no focal findings.  LABORATORY DATA: Lab Results  Component Value Date   WBC 7.3 07/16/2013   HGB 14.1 07/16/2013   HCT 43.0 07/16/2013   PLT 175.0 07/16/2013   GLUCOSE 95 07/29/2013   CHOL  Value: 116        ATP III CLASSIFICATION:  <200     mg/dL   Desirable  200-239  mg/dL   Borderline High  >=240    mg/dL   High        12/23/2008   TRIG 138 12/23/2008   HDL 35* 12/23/2008   LDLCALC  Value: 53        Total Cholesterol/HDL:CHD Risk Coronary Heart Disease Risk Table                     Men   Women  1/2 Average Risk   3.4   3.3  Average Risk       5.0   4.4  2 X Average Risk   9.6   7.1  3 X Average Risk  23.4   11.0        Use the calculated Patient Ratio above and the CHD Risk Table to determine the patient's CHD Risk.        ATP III CLASSIFICATION (LDL):  <100     mg/dL   Optimal  100-129  mg/dL   Near or Above                    Optimal  130-159  mg/dL   Borderline  160-189  mg/dL   High  >190     mg/dL   Very High 12/23/2008   ALT 33 03/08/2009   AST 23 03/08/2009   NA 137 07/29/2013   K 3.8 07/29/2013   CL 101 07/29/2013   CREATININE 1.2 07/29/2013   BUN 16 07/29/2013   CO2 30 07/29/2013   HGBA1C 5.8* 01/30/2013   Ecg: 07/29/13: normal.Qtc 461 msec.    Assessment / Plan: 1. Paroxysmal atrial fibrillation. Now on higher dose of diltiazem for rate control. On Tikosyn. S/p leadless ventricular pacemaker for pauses.  He remains significantly symptomatic. Follow up with Dr. Rayann Heman on April 22. If pacemaker confirms frequent tachycardia will need to consider ablative therapy.  2. Hypercholesterolemia. Now on lipitor.

## 2013-08-18 NOTE — Patient Instructions (Signed)
Keep your appointment with Dr. Rayann Heman.  I will see you in 3 months.

## 2013-08-20 ENCOUNTER — Encounter: Payer: Self-pay | Admitting: Internal Medicine

## 2013-09-03 ENCOUNTER — Ambulatory Visit (INDEPENDENT_AMBULATORY_CARE_PROVIDER_SITE_OTHER): Payer: Managed Care, Other (non HMO) | Admitting: Internal Medicine

## 2013-09-03 ENCOUNTER — Encounter: Payer: Self-pay | Admitting: *Deleted

## 2013-09-03 ENCOUNTER — Encounter: Payer: Self-pay | Admitting: Internal Medicine

## 2013-09-03 VITALS — BP 156/100 | HR 61 | Ht 73.0 in | Wt 188.0 lb

## 2013-09-03 DIAGNOSIS — I4892 Unspecified atrial flutter: Secondary | ICD-10-CM

## 2013-09-03 DIAGNOSIS — I495 Sick sinus syndrome: Secondary | ICD-10-CM

## 2013-09-03 DIAGNOSIS — I4891 Unspecified atrial fibrillation: Secondary | ICD-10-CM

## 2013-09-03 DIAGNOSIS — I480 Paroxysmal atrial fibrillation: Secondary | ICD-10-CM

## 2013-09-03 DIAGNOSIS — Z950 Presence of cardiac pacemaker: Secondary | ICD-10-CM

## 2013-09-03 LAB — MDC_IDC_ENUM_SESS_TYPE_INCLINIC

## 2013-09-03 NOTE — Progress Notes (Signed)
Primary Care Physician: Jilda Panda, MD Referring Physician:  Dr [place]   Marco Morris is a 58 y.o. male with a h/o paroxysmal atrial fibrillation who is referred for EP follow-up.  He reports that he was first diagnosed with atrial fibrillation in 2003.  This initially was managed with beta blocker therapy. Due to excessive fatigue he was switched to diltiazem.   He reports increasing frequency and duration of atrial fibrillation.  He reports symptoms of palpitations, decreased exercise tolerance and fatigue with afib.  He has also had several episodes of abrupt presyncope with prolonged pauses documented.  He underwent leadless pacing by me 3/15 with termination of his pauses.  He was also started on tikosyn for afib management.  He previously failed medical therapy with flecainide.  Presently, he has ongoing daily afib.  Episodes last minutes to hours and are associated with palpitations and SOB.  He is unaware of triggers/ precipitants.  Today, he denies symptoms of chest pain,  orthopnea, PND, lower extremity edema, or neurologic sequela. The patient is tolerating medications without difficulties and is otherwise without complaint today.   Past Medical History  Diagnosis Date  . PAF (paroxysmal atrial fibrillation)     a.  anticoagulated with Xarelto b. s/p Tikosyn loading 07/2013  . Hypercholesterolemia   . History of renal calculi   . Hypothyroidism   . Dizzy   . Shortness of breath     shortness of breath even at rest  . Blood dyscrasia     high risk for blood clot formation  . Chronic kidney disease     right renal stone   . Neuromuscular disorder     numbness left foot  . Tachycardia-bradycardia syndrome     post termination pauses with afib and symptoms of presyncope   Past Surgical History  Procedure Laterality Date  . Cholecystectomy    . Kidney cyst removal    . Lithotripsy  2004  . Pacemaker insertion  07/18/2013    STJ Leadless pacemaker implanted by Dr Rayann Heman  for symptomatic pauses    Current Outpatient Prescriptions  Medication Sig Dispense Refill  . atorvastatin (LIPITOR) 20 MG tablet Take 20 mg by mouth every evening.       . diltiazem (CARDIZEM CD) 120 MG 24 hr capsule Take 1 capsule (120 mg total) by mouth daily. To take with diltiazem CD 240 mg once daily (to total 360 mg once daily) as directed by Dr. Rayann Heman.  30 capsule  4  . diltiazem (CARDIZEM CD) 240 MG 24 hr capsule Take 240 mg by mouth daily.      Marland Kitchen dofetilide (TIKOSYN) 500 MCG capsule Take 1 capsule (500 mcg total) by mouth every 12 (twelve) hours.  60 capsule  6  . levothyroxine (SYNTHROID, LEVOTHROID) 125 MCG tablet Take 125 mcg by mouth daily before breakfast.       . potassium chloride SA (K-DUR,KLOR-CON) 20 MEQ tablet Take 1 tablet (20 mEq total) by mouth daily.  30 tablet  6  . Rivaroxaban (XARELTO) 20 MG TABS tablet Take 1 tablet (20 mg total) by mouth daily.  30 tablet  1   No current facility-administered medications for this visit.    Allergies  Allergen Reactions  . Codeine     unknown    History   Social History  . Marital Status: Married    Spouse Name: [name]    Number of Children: 2  . Years of Education: [name]   Occupational History  . DRIVER [employer]   Social History Main Topics  . Smoking status: Former Smoker -- 1.00 packs/day for 14 years    Types: Cigarettes    Quit date: 05/15/1985  . Smokeless tobacco: Former Systems developer    Quit date: 05/15/1985  . Alcohol Use: Yes     Comment: OCCASIONAL  . Drug Use: No  . Sexual Activity: Not on file   Other Topics Concern  . 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    Family History  Problem Relation Age of Onset  . Heart attack Mother   . Stroke Mother   . Diabetes Mother   . Cancer - Ovarian Mother   . Leukemia Mother   . Heart attack Father   . Stroke Father   . Diabetes Father   . Cancer - Other Father   . Hypertension Brother     ROS- All  systems are reviewed and negative except as per the HPI above  Physical Exam: Filed Vitals:   09/03/13 1016  BP: 156/100  Pulse: 61  Height: 6\' 1"  (1.854 m)  Weight: 188 lb (85.276 kg)    GEN- The patient is well appearing, alert and oriented x 3 today.   Head- normocephalic, atraumatic Eyes-  Sclera clear, conjunctiva pink Ears- hearing intact Oropharynx- clear Neck- supple, no JVP Lymph- no cervical lymphadenopathy Lungs- Clear to ausculation bilaterally, normal work of breathing Heart- Regular rate and rhythm, no murmurs, rubs or gallops, PMI not laterally displaced GI- soft, NT, ND, + BS Extremities- no clubbing, cyanosis, or edema Neuro- strength and sensation are intact  EKG today reveals sinus rhythm 61 bpm, PR 124, QTc 422, otherwise normal ekg Dr Morrison Old records are also reviewed  Assessment and Plan:  1. Paroxysmal atrial fibrillation/ atrial flutter The patient has symptomatic paroxysmal atrial fibrillation and atrial flutter.  He has failed antiarrhythmic drug therapy with flecainide and tikosyn. Therapeutic strategies for afib and atrial flutter including medicine and ablation were discussed in detail with the patient today. Risk, benefits, and alternatives to EP study and radiofrequency ablation for afib were also discussed in detail today. These risks include but are not limited to stroke, bleeding, vascular damage, tamponade, perforation, damage to the esophagus, lungs, and other structures, pulmonary vein stenosis, worsening renal function, and death. The patient understands these risk and wishes to proceed.  We will therefore proceed with catheter ablation at the next available time.  2. Tachycardia/ bradycardia syndrome Normal pacemaker function See Pace Art report No changes today

## 2013-09-03 NOTE — Patient Instructions (Signed)
Your physician has recommended that you have an ablation. Catheter ablation is a medical procedure used to treat some cardiac arrhythmias (irregular heartbeats). During catheter ablation, a long, thin, flexible tube is put into a blood vessel in your groin (upper thigh), or neck. This tube is called an ablation catheter. It is then guided to your heart through the blood vessel. Radio frequency waves destroy small areas of heart tissue where abnormal heartbeats may cause an arrhythmia to start. Please see the instruction sheet given to you today.    See instruction sheet for ablation 

## 2013-09-03 NOTE — Addendum Note (Signed)
Addended by: Janan Halter F on: 09/03/2013 01:04 PM   Modules accepted: Orders

## 2013-09-22 ENCOUNTER — Telehealth: Payer: Self-pay | Admitting: Internal Medicine

## 2013-09-22 NOTE — Telephone Encounter (Signed)
New message     Need letter stating pt needs to be out of work for procedure he is having on the 28th.  He want to be out of work from 5-25 and return June 8th.  Call pt and he will pick up note.

## 2013-09-23 NOTE — Telephone Encounter (Signed)
I left message for patient and let him know I can write a note for him to be out of work from 10/08/13 and return on 10/20/13.  I have asked that he return my call

## 2013-09-24 NOTE — Telephone Encounter (Addendum)
Spoke with patient and let him know I would leave the letter out front

## 2013-09-24 NOTE — Telephone Encounter (Signed)
New problem ° ° °Pt returning your call from yesterday. °

## 2013-09-25 ENCOUNTER — Encounter (HOSPITAL_COMMUNITY): Payer: Self-pay | Admitting: Pharmacy Technician

## 2013-09-25 ENCOUNTER — Encounter: Payer: Self-pay | Admitting: *Deleted

## 2013-10-02 ENCOUNTER — Ambulatory Visit (INDEPENDENT_AMBULATORY_CARE_PROVIDER_SITE_OTHER): Payer: Managed Care, Other (non HMO) | Admitting: *Deleted

## 2013-10-02 DIAGNOSIS — I495 Sick sinus syndrome: Secondary | ICD-10-CM

## 2013-10-02 DIAGNOSIS — I4891 Unspecified atrial fibrillation: Secondary | ICD-10-CM

## 2013-10-02 DIAGNOSIS — I4892 Unspecified atrial flutter: Secondary | ICD-10-CM

## 2013-10-02 LAB — CBC WITH DIFFERENTIAL/PLATELET
BASOS ABS: 0.1 10*3/uL (ref 0.0–0.1)
Basophils Relative: 0.4 % (ref 0.0–3.0)
EOS ABS: 0.6 10*3/uL (ref 0.0–0.7)
Eosinophils Relative: 4.8 % (ref 0.0–5.0)
HEMATOCRIT: 43.9 % (ref 39.0–52.0)
Hemoglobin: 14.6 g/dL (ref 13.0–17.0)
Lymphocytes Relative: 18.5 % (ref 12.0–46.0)
Lymphs Abs: 2.5 10*3/uL (ref 0.7–4.0)
MCHC: 33.2 g/dL (ref 30.0–36.0)
MCV: 87.2 fl (ref 78.0–100.0)
Monocytes Absolute: 0.7 10*3/uL (ref 0.1–1.0)
Monocytes Relative: 5.2 % (ref 3.0–12.0)
Neutro Abs: 9.6 10*3/uL — ABNORMAL HIGH (ref 1.4–7.7)
Neutrophils Relative %: 71.1 % (ref 43.0–77.0)
Platelets: 229 10*3/uL (ref 150.0–400.0)
RBC: 5.03 Mil/uL (ref 4.22–5.81)
RDW: 14 % (ref 11.5–15.5)
WBC: 13.6 10*3/uL — ABNORMAL HIGH (ref 4.0–10.5)

## 2013-10-02 LAB — BASIC METABOLIC PANEL
BUN: 15 mg/dL (ref 6–23)
CO2: 30 mEq/L (ref 19–32)
Calcium: 9.6 mg/dL (ref 8.4–10.5)
Chloride: 99 mEq/L (ref 96–112)
Creatinine, Ser: 1.5 mg/dL (ref 0.4–1.5)
GFR: 51.5 mL/min — AB (ref >60.00)
GLUCOSE: 92 mg/dL (ref 70–99)
POTASSIUM: 3.9 meq/L (ref 3.5–5.1)
Sodium: 138 mEq/L (ref 135–145)

## 2013-10-08 ENCOUNTER — Ambulatory Visit (HOSPITAL_COMMUNITY)
Admission: RE | Admit: 2013-10-08 | Discharge: 2013-10-08 | Disposition: A | Discharge status: Home / Self Care | Payer: Managed Care, Other (non HMO) | Source: Ambulatory Visit | Attending: Internal Medicine | Admitting: Internal Medicine

## 2013-10-08 ENCOUNTER — Encounter (HOSPITAL_COMMUNITY)
Admission: RE | Disposition: A | Discharge status: Home / Self Care | Payer: Self-pay | Source: Ambulatory Visit | Attending: Internal Medicine

## 2013-10-08 ENCOUNTER — Encounter (HOSPITAL_COMMUNITY): Payer: Self-pay

## 2013-10-08 DIAGNOSIS — E039 Hypothyroidism, unspecified: Secondary | ICD-10-CM | POA: Insufficient documentation

## 2013-10-08 DIAGNOSIS — I4891 Unspecified atrial fibrillation: Secondary | ICD-10-CM | POA: Insufficient documentation

## 2013-10-08 DIAGNOSIS — Z79899 Other long term (current) drug therapy: Secondary | ICD-10-CM | POA: Insufficient documentation

## 2013-10-08 DIAGNOSIS — N189 Chronic kidney disease, unspecified: Secondary | ICD-10-CM | POA: Insufficient documentation

## 2013-10-08 DIAGNOSIS — Z950 Presence of cardiac pacemaker: Secondary | ICD-10-CM | POA: Insufficient documentation

## 2013-10-08 DIAGNOSIS — I480 Paroxysmal atrial fibrillation: Secondary | ICD-10-CM

## 2013-10-08 DIAGNOSIS — E7800 Pure hypercholesterolemia, unspecified: Secondary | ICD-10-CM | POA: Insufficient documentation

## 2013-10-08 DIAGNOSIS — Z7901 Long term (current) use of anticoagulants: Secondary | ICD-10-CM | POA: Insufficient documentation

## 2013-10-08 HISTORY — PX: TEE WITHOUT CARDIOVERSION: SHX5443

## 2013-10-08 SURGERY — ECHOCARDIOGRAM, TRANSESOPHAGEAL
Anesthesia: Moderate Sedation

## 2013-10-08 MED ORDER — MIDAZOLAM HCL 10 MG/2ML IJ SOLN
INTRAMUSCULAR | Status: DC | PRN
  Administered 2013-10-08: 2 mg via INTRAVENOUS
  Administered 2013-10-08: 1 mg via INTRAVENOUS
  Administered 2013-10-08: 2 mg via INTRAVENOUS

## 2013-10-08 MED ORDER — MIDAZOLAM HCL 5 MG/ML IJ SOLN
INTRAMUSCULAR | Status: AC
  Filled 2013-10-08: qty 2

## 2013-10-08 MED ORDER — SODIUM CHLORIDE 0.9 % IV SOLN
INTRAVENOUS | Status: DC
  Administered 2013-10-08: 08:00:00 via INTRAVENOUS

## 2013-10-08 MED ORDER — BUTAMBEN-TETRACAINE-BENZOCAINE 2-2-14 % EX AERO
INHALATION_SPRAY | CUTANEOUS | Status: DC | PRN
  Administered 2013-10-08: 2 via TOPICAL

## 2013-10-08 MED ORDER — FENTANYL CITRATE 0.05 MG/ML IJ SOLN
INTRAMUSCULAR | Status: AC
  Filled 2013-10-08: qty 2

## 2013-10-08 MED ORDER — FENTANYL CITRATE 0.05 MG/ML IJ SOLN
INTRAMUSCULAR | Status: DC | PRN
  Administered 2013-10-08 (×2): 25 ug via INTRAVENOUS
  Administered 2013-10-08: 50 ug via INTRAVENOUS

## 2013-10-08 NOTE — Op Note (Signed)
INDICATIONS: atrial fibrillation pre-ablation  PROCEDURE:   Informed consent was obtained prior to the procedure. The risks, benefits and alternatives for the procedure were discussed and the patient comprehended these risks.  Risks include, but are not limited to, cough, sore throat, vomiting, nausea, somnolence, esophageal and stomach trauma or perforation, bleeding, low blood pressure, aspiration, pneumonia, infection, trauma to the teeth and death.    After a procedural time-out, the oropharynx was anesthetized with 20% benzocaine spray. The patient was given 5 mg versed and 100 mcg fentanyl for moderate sedation.   The transesophageal probe was inserted in the esophagus and stomach without difficulty and multiple views were obtained.  The patient was kept under observation until the patient left the procedure room.  The patient left the procedure room in stable condition.   Agitated microbubble saline contrast was not administered.  COMPLICATIONS:    There were no immediate complications.  FINDINGS:  Normal TEE. No LA thrombus. Excellent appendage velocities.  RECOMMENDATIONS:     Proceed with cardioversion.  Time Spent Directly with the Patient:  45 minutes   Deja Pisarski 10/08/2013, 9:28 AM

## 2013-10-08 NOTE — H&P (Signed)
Chief Complaint:  atrial fibrillation   HPI:  Marco Morris is a 58 y.o. male with a h/o paroxysmal atrial fibrillation who is referred for EP follow-up. He reports that he was first diagnosed with atrial fibrillation in 2003. This initially was managed with beta blocker therapy. Due to excessive fatigue he was switched to diltiazem. He reports increasing frequency and duration of atrial fibrillation. He reports symptoms of palpitations, decreased exercise tolerance and fatigue with afib. He has also had several episodes of abrupt presyncope with prolonged pauses documented. He underwent leadless pacing by me 3/15 with termination of his pauses. He was also started on tikosyn for afib management. He previously failed medical therapy with flecainide. Presently, he has ongoing daily afib. Episodes last minutes to hours and are associated with palpitations and SOB. He is unaware of triggers/ precipitants.  Scheduled for RF ablation tomorrow.   PMHx:  Past Medical History  Diagnosis Date  . PAF (paroxysmal atrial fibrillation)     a.  anticoagulated with Xarelto b. s/p Tikosyn loading 07/2013  . Hypercholesterolemia   . History of renal calculi   . Hypothyroidism   . Dizzy   . Shortness of breath     shortness of breath even at rest  . Blood dyscrasia     high risk for blood clot formation  . Chronic kidney disease     right renal stone   . Neuromuscular disorder     numbness left foot  . Tachycardia-bradycardia syndrome     post termination pauses with afib and symptoms of presyncope  . Pacemaker     Past Surgical History  Procedure Laterality Date  . Cholecystectomy    . Kidney cyst removal    . Lithotripsy  2004  . Pacemaker insertion  07/18/2013    STJ Leadless pacemaker implanted by Dr Rayann Heman for symptomatic pauses    FAMHx:  Family History  Problem Relation Age of Onset  . Heart attack Mother   . Stroke Mother   . Diabetes Mother   . Cancer - Ovarian Mother   .  Leukemia Mother   . Heart attack Father   . Stroke Father   . Diabetes Father   . Cancer - Other Father   . Hypertension Brother     SOCHx:   reports that he quit smoking about 28 years ago. His smoking use included Cigarettes. He has a 14 pack-year smoking history. He quit smokeless tobacco use about 28 years ago. He reports that he drinks alcohol. He reports that he does not use illicit drugs.  ALLERGIES:  Allergies  Allergen Reactions  . Codeine     unknown    ROS: Pertinent items are noted in HPI.  HOME MEDS: Medications Prior to Admission  Medication Sig Dispense Refill  . atorvastatin (LIPITOR) 20 MG tablet Take 20 mg by mouth every evening.       . diltiazem (CARDIZEM CD) 120 MG 24 hr capsule Take 1 capsule (120 mg total) by mouth daily. To take with diltiazem CD 240 mg once daily (to total 360 mg once daily) as directed by Dr. Rayann Heman.  30 capsule  4  . diltiazem (CARDIZEM CD) 240 MG 24 hr capsule Take 240 mg by mouth daily.      Marland Kitchen dofetilide (TIKOSYN) 500 MCG capsule Take 1 capsule (500 mcg total) by mouth every 12 (twelve) hours.  60 capsule  6  . levofloxacin (LEVAQUIN) 500 MG tablet Take 500 mg by mouth daily. For 7  days. Started 09/22/13      . levothyroxine (SYNTHROID, LEVOTHROID) 125 MCG tablet Take 125 mcg by mouth daily before breakfast.       . lisinopril-hydrochlorothiazide (PRINZIDE,ZESTORETIC) 10-12.5 MG per tablet Take 1 tablet by mouth daily.      . potassium chloride SA (K-DUR,KLOR-CON) 20 MEQ tablet Take 1 tablet (20 mEq total) by mouth daily.  30 tablet  6  . Rivaroxaban (XARELTO) 20 MG TABS tablet Take 1 tablet (20 mg total) by mouth daily.  30 tablet  1    LABS/IMAGING: No results found for this or any previous visit (from the past 48 hour(s)). No results found.  VITALS: Blood pressure 128/86, pulse 70, temperature 97.8 F (36.6 C), temperature source Oral, resp. rate 16, height 6\' 1"  (1.854 m), weight 188 lb (85.276 kg), SpO2  97.00%.  EXAM:   General: Alert, oriented x3, no distress Head: no evidence of trauma, PERRL, EOMI, no exophtalmos or lid lag, no myxedema, no xanthelasma; normal ears, nose and oropharynx Neck: normal jugular venous pulsations and no hepatojugular reflux; brisk carotid pulses without delay and no carotid bruits Chest: clear to auscultation, no signs of consolidation by percussion or palpation, normal fremitus, symmetrical and full respiratory excursions Cardiovascular: normal position and quality of the apical impulse, irregular rhythm, normal first and second heart sounds, no murmurs, rubs or gallops Abdomen: no tenderness or distention, no masses by palpation, no abnormal pulsatility or arterial bruits, normal bowel sounds, no hepatosplenomegaly Extremities: no clubbing, cyanosis or edema; 2+ radial, ulnar and brachial pulses bilaterally; 2+ right femoral, posterior tibial and dorsalis pedis pulses; 2+ left femoral, posterior tibial and dorsalis pedis pulses; no subclavian or femoral bruits Neurological: grossly nonfocal    IMPRESSION: Recurrent symptomatic PAF for ablation tomorrow.  PLAN: TEE This procedure has been fully reviewed with the patient and written informed consent has been obtained.    Sanda Klein, MD, [facility] [phone] office [phone] pager  10/08/2013, 9:11 AM

## 2013-10-08 NOTE — Progress Notes (Signed)
  Echocardiogram Echocardiogram Transesophageal has been performed.  Marco Morris 10/08/2013, 10:05 AM

## 2013-10-08 NOTE — Discharge Instructions (Signed)
Conscious Sedation, Adult, Care After Refer to this sheet in the next few weeks. These instructions provide you with information on caring for yourself after your procedure. Your health care provider may also give you more specific instructions. Your treatment has been planned according to current medical practices, but problems sometimes occur. Call your health care provider if you have any problems or questions after your procedure. WHAT TO EXPECT AFTER THE PROCEDURE  After your procedure:  You may feel sleepy, clumsy, and have poor balance for several hours.  Vomiting may occur if you eat too soon after the procedure. HOME CARE INSTRUCTIONS  Do not participate in any activities where you could become injured for at least 24 hours. Do not:  Drive.  Swim.  Ride a bicycle.  Operate heavy machinery.  Cook.  Use power tools.  Climb ladders.  Work from a high place.  Do not make important decisions or sign legal documents until you are improved.  If you vomit, drink water, juice, or soup when you can drink without vomiting. Make sure you have little or no nausea before eating solid foods.  Only take over-the-counter or prescription medicines for pain, discomfort, or fever as directed by your health care provider.  Make sure you and your family fully understand everything about the medicines given to you, including what side effects may occur.  You should not drink alcohol, take sleeping pills, or take medicines that cause drowsiness for at least 24 hours.  If you smoke, do not smoke without supervision.  If you are feeling better, you may resume normal activities 24 hours after you were sedated.  Keep all appointments with your health care provider. SEEK MEDICAL CARE IF:  Your skin is pale or bluish in color.  You continue to feel nauseous or vomit.  Your pain is getting worse and is not helped by medicine.  You have bleeding or swelling.  You are still sleepy or  feeling clumsy after 24 hours. SEEK IMMEDIATE MEDICAL CARE IF:  You develop a rash.  You have difficulty breathing.  You develop any type of allergic problem.  You have a fever. MAKE SURE YOU:  Understand these instructions.  Will watch your condition.  Will get help right away if you are not doing well or get worse. Document Released: 02/19/2013 Document Reviewed: 12/06/2012 [facility] Patient Information 2014 [place], [place]. Transesophageal Echocardiography Transesophageal echocardiography (TEE) is a picture test of your heart using sound waves. The pictures taken can give very detailed pictures of your heart. This can help your doctor see if there are problems with your heart. TEE can check:  If your heart has blood clots in it.  How well your heart valves are working.  If you have an infection on the inside of your heart.  Some of the major arteries of your heart.  If your heart valve is working after a Office manager.  Your heart before a procedure that uses a shock to your heart to get the rhythm back to normal. BEFORE THE PROCEDURE  Do not eat or drink for 6 hours before the procedure or as told by your doctor.  Make plans to have someone drive you home after the procedure. Do not drive yourself home.  An IV tube will be put in your arm. PROCEDURE  You will be given a medicine to help you relax (sedative). It will be given through the IV tube.  A numbing medicine will be sprayed in the back of your throat to help  to help numb it. °· The tip of the probe is placed into the back of your mouth. You will be asked to swallow. This helps to pass the probe into your esophagus. °· Once the tip of the probe is in the right place, your doctor can take pictures of your heart. °· You may feel pressure at the back of your throat. °AFTER THE PROCEDURE °· You will be taken to a recovery area so the sedative can wear off. °· Your throat may be sore and scratchy. This will go away slowly over  time. °· You will go home when you are fully awake and able to swallow liquids. °· You should have someone stay with you for the next 24 hours. °Document Released: 02/26/2009 Document Revised: 02/19/2013 Document Reviewed: 10/31/2012 °[employer]® Patient Information ©2014 [employer]. ° °

## 2013-10-09 ENCOUNTER — Ambulatory Visit (HOSPITAL_COMMUNITY)
Admission: RE | Admit: 2013-10-09 | Discharge: 2013-10-10 | Disposition: A | Discharge status: Home / Self Care | Payer: Managed Care, Other (non HMO) | Source: Ambulatory Visit | Attending: Internal Medicine | Admitting: Internal Medicine

## 2013-10-09 ENCOUNTER — Ambulatory Visit (HOSPITAL_COMMUNITY): Payer: Managed Care, Other (non HMO) | Admitting: Certified Registered Nurse Anesthetist

## 2013-10-09 ENCOUNTER — Encounter (HOSPITAL_COMMUNITY)
Admission: RE | Disposition: A | Discharge status: Home / Self Care | Payer: Self-pay | Source: Ambulatory Visit | Attending: Internal Medicine

## 2013-10-09 ENCOUNTER — Encounter (HOSPITAL_COMMUNITY): Payer: Self-pay | Admitting: Cardiovascular Disease

## 2013-10-09 ENCOUNTER — Ambulatory Visit (INDEPENDENT_AMBULATORY_CARE_PROVIDER_SITE_OTHER): Payer: Managed Care, Other (non HMO) | Admitting: *Deleted

## 2013-10-09 ENCOUNTER — Encounter (HOSPITAL_COMMUNITY): Payer: Managed Care, Other (non HMO) | Admitting: Certified Registered Nurse Anesthetist

## 2013-10-09 DIAGNOSIS — I4891 Unspecified atrial fibrillation: Secondary | ICD-10-CM

## 2013-10-09 DIAGNOSIS — I495 Sick sinus syndrome: Secondary | ICD-10-CM | POA: Diagnosis present

## 2013-10-09 DIAGNOSIS — I4892 Unspecified atrial flutter: Secondary | ICD-10-CM

## 2013-10-09 DIAGNOSIS — I10 Essential (primary) hypertension: Secondary | ICD-10-CM | POA: Insufficient documentation

## 2013-10-09 HISTORY — PX: ATRIAL FIBRILLATION ABLATION: SHX5456

## 2013-10-09 HISTORY — DX: Essential (primary) hypertension: I10

## 2013-10-09 LAB — POCT ACTIVATED CLOTTING TIME
Activated Clotting Time: 310 seconds
Activated Clotting Time: 288 seconds
Activated Clotting Time: 310 seconds
Activated Clotting Time: 160 seconds

## 2013-10-09 SURGERY — ATRIAL FIBRILLATION ABLATION
Anesthesia: Monitor Anesthesia Care

## 2013-10-09 MED ORDER — SODIUM CHLORIDE 0.9 % IV SOLN: 250.0000 mL | INTRAVENOUS | Status: DC | PRN

## 2013-10-09 MED ORDER — HYDROCODONE-ACETAMINOPHEN 5-325 MG PO TABS: 1.0000 | ORAL_TABLET | ORAL | Status: DC | PRN

## 2013-10-09 MED ORDER — DOBUTAMINE IN D5W 4-5 MG/ML-% IV SOLN
INTRAVENOUS | Status: DC | PRN
  Administered 2013-10-09: 5 ug/kg/min via INTRAVENOUS

## 2013-10-09 MED ORDER — LEVOTHYROXINE SODIUM 125 MCG PO TABS
125.0000 ug | ORAL_TABLET | Freq: Every day | ORAL | Status: DC
  Administered 2013-10-10: 125 ug via ORAL
  Filled 2013-10-09 (×2): qty 1

## 2013-10-09 MED ORDER — HYDROMORPHONE HCL PF 1 MG/ML IJ SOLN: 0.2500 mg | INTRAMUSCULAR | Status: DC | PRN

## 2013-10-09 MED ORDER — LIDOCAINE HCL (CARDIAC) 20 MG/ML IV SOLN
INTRAVENOUS | Status: DC | PRN
  Administered 2013-10-09: 80 mg via INTRAVENOUS

## 2013-10-09 MED ORDER — SODIUM CHLORIDE 0.9 % IJ SOLN: 3.0000 mL | INTRAMUSCULAR | Status: DC | PRN

## 2013-10-09 MED ORDER — OXYCODONE HCL 5 MG/5ML PO SOLN: 5.0000 mg | Freq: Once | ORAL | Status: DC | PRN

## 2013-10-09 MED ORDER — HEPARIN SODIUM (PORCINE) 1000 UNIT/ML IJ SOLN
INTRAMUSCULAR | Status: DC | PRN
  Administered 2013-10-09: 3000 [IU] via INTRAVENOUS
  Administered 2013-10-09: 12000 [IU] via INTRAVENOUS

## 2013-10-09 MED ORDER — ADENOSINE 6 MG/2ML IV SOLN
INTRAVENOUS | Status: AC
  Filled 2013-10-09: qty 12

## 2013-10-09 MED ORDER — PROTAMINE SULFATE 10 MG/ML IV SOLN
INTRAVENOUS | Status: DC | PRN
  Administered 2013-10-09: 40 mg via INTRAVENOUS

## 2013-10-09 MED ORDER — ONDANSETRON HCL 4 MG/2ML IJ SOLN: 4.0000 mg | Freq: Once | INTRAMUSCULAR | Status: DC | PRN

## 2013-10-09 MED ORDER — SODIUM CHLORIDE 0.9 % IJ SOLN: 3.0000 mL | Freq: Two times a day (BID) | INTRAMUSCULAR | Status: DC

## 2013-10-09 MED ORDER — FENTANYL CITRATE 0.05 MG/ML IJ SOLN
INTRAMUSCULAR | Status: DC | PRN
Start: 2013-10-09 — End: 2013-10-09
  Administered 2013-10-09: 25 ug via INTRAVENOUS
  Administered 2013-10-09 (×2): 50 ug via INTRAVENOUS
  Administered 2013-10-09: 25 ug via INTRAVENOUS
  Administered 2013-10-09: 50 ug via INTRAVENOUS

## 2013-10-09 MED ORDER — DOFETILIDE 500 MCG PO CAPS
500.0000 ug | ORAL_CAPSULE | Freq: Two times a day (BID) | ORAL | Status: DC
  Administered 2013-10-09 – 2013-10-10 (×2): 500 ug via ORAL
  Filled 2013-10-09 (×4): qty 1

## 2013-10-09 MED ORDER — MIDAZOLAM HCL 5 MG/5ML IJ SOLN
INTRAMUSCULAR | Status: DC | PRN
  Administered 2013-10-09 (×2): 1 mg via INTRAVENOUS

## 2013-10-09 MED ORDER — PROPOFOL 10 MG/ML IV BOLUS
INTRAVENOUS | Status: DC | PRN
  Administered 2013-10-09: 150 mg via INTRAVENOUS

## 2013-10-09 MED ORDER — DOBUTAMINE IN D5W 4-5 MG/ML-% IV SOLN
2.5000 ug/kg/min | Freq: Once | INTRAVENOUS | Status: AC
  Administered 2013-10-09: 10 ug/kg/min via INTRAVENOUS
  Filled 2013-10-09: qty 250

## 2013-10-09 MED ORDER — OXYCODONE HCL 5 MG PO TABS: 5.0000 mg | ORAL_TABLET | Freq: Once | ORAL | Status: DC | PRN

## 2013-10-09 MED ORDER — RIVAROXABAN 20 MG PO TABS
20.0000 mg | ORAL_TABLET | Freq: Every day | ORAL | Status: DC
  Administered 2013-10-09: 19:00:00 20 mg via ORAL
  Filled 2013-10-09 (×2): qty 1

## 2013-10-09 MED ORDER — ADENOSINE 6 MG/2ML IV SOLN
INTRAVENOUS | Status: DC | PRN
  Administered 2013-10-09 (×3): 12 mg via INTRAVENOUS

## 2013-10-09 MED ORDER — BUPIVACAINE HCL (PF) 0.25 % IJ SOLN
INTRAMUSCULAR | Status: AC
  Filled 2013-10-09: qty 60

## 2013-10-09 MED ORDER — ONDANSETRON HCL 4 MG/2ML IJ SOLN
INTRAMUSCULAR | Status: DC | PRN
  Administered 2013-10-09: 4 mg via INTRAVENOUS

## 2013-10-09 MED ORDER — SODIUM CHLORIDE 0.9 % IV SOLN
INTRAVENOUS | Status: DC | PRN
  Administered 2013-10-09: 07:00:00 via INTRAVENOUS

## 2013-10-09 NOTE — H&P (Signed)
Primary Care Physician: Jilda Panda, MD  Referring Physician: Dr Martinique   Marco Morris is a 58 y.o. male with a h/o paroxysmal atrial fibrillation who presents for afib ablation. He reports that he was first diagnosed with atrial fibrillation in 2003. This initially was managed with beta blocker therapy. Due to excessive fatigue he was switched to diltiazem. He reports increasing frequency and duration of atrial fibrillation. He reports symptoms of palpitations, decreased exercise tolerance and fatigue with afib. He has also had several episodes of abrupt presyncope with prolonged pauses documented. He underwent leadless pacing by me 3/15 with termination of his pauses. He was also started on tikosyn for afib management. He previously failed medical therapy with flecainide. Presently, he has ongoing daily afib. Episodes last minutes to hours and are associated with palpitations and SOB. He is unaware of triggers/ precipitants.  Today, he denies symptoms of chest pain, orthopnea, PND, lower extremity edema, or neurologic sequela. The patient is tolerating medications without difficulties and is otherwise without complaint today.    Past Medical History   Diagnosis  Date   .  PAF (paroxysmal atrial fibrillation)      a. anticoagulated with Xarelto b. s/p Tikosyn loading 07/2013   .  Hypercholesterolemia    .  History of renal calculi    .  Hypothyroidism    .  Dizzy    .  Shortness of breath      shortness of breath even at rest   .  Blood dyscrasia      high risk for blood clot formation   .  Chronic kidney disease      right renal stone   .  Neuromuscular disorder      numbness left foot   .  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  Cholecystectomy     .  Kidney cyst removal     .  Lithotripsy   2004   .  Pacemaker insertion   07/18/2013     STJ Leadless pacemaker  implanted by Dr Rayann Heman for symptomatic pauses    Current Outpatient Prescriptions   Medication  Sig  Dispense  Refill   .  atorvastatin (LIPITOR) 20 MG tablet  Take 20 mg by mouth every evening.     .  diltiazem (CARDIZEM CD) 120 MG 24 hr capsule  Take 1 capsule (120 mg total) by mouth daily. To take with diltiazem CD 240 mg once daily (to total 360 mg once daily) as directed by Dr. Rayann Heman.  30 capsule  4   .  diltiazem (CARDIZEM CD) 240 MG 24 hr capsule  Take 240 mg by mouth daily.     Marland Kitchen  dofetilide (TIKOSYN) 500 MCG capsule  Take 1 capsule (500 mcg total) by mouth every 12 (twelve) hours.  60 capsule  6   .  levothyroxine (SYNTHROID, LEVOTHROID) 125 MCG tablet  Take 125 mcg by mouth daily before breakfast.     .  potassium chloride SA (K-DUR,KLOR-CON) 20 MEQ tablet  Take 1 tablet (20 mEq total) by mouth daily.  30 tablet  6   .  Rivaroxaban (XARELTO) 20 MG TABS tablet  Take 1 tablet (20 mg total) by mouth daily.  30 tablet  1    No current facility-administered medications for this visit.    Allergies   Allergen  Reactions   .  Codeine      unknown    History    Social History   .  Marital Status:  Married     Spouse Name:  [name]     Number of Children:  2   .  Years of Education:  [name]    Occupational History   .  DRIVER  [name]    Social History Main Topics   .  Smoking status:  Former Smoker -- 1.00 packs/day for 14 years     Types:  Cigarettes     Quit date:  05/15/1985   .  Smokeless tobacco:  Former Systems developer     Quit date:  05/15/1985   .  Alcohol Use:  Yes      Comment: OCCASIONAL   .  Drug Use:  No   .  Sexual Activity:  Not on file    Other Topics  Concern   .  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    Family History   Problem  Relation  Age of Onset   .  Heart attack  Mother    .  Stroke  Mother    .  Diabetes  Mother    .  Cancer - Ovarian  Mother    .  Leukemia  Mother    .  Heart attack  Father     .  Stroke  Father    .  Diabetes  Father    .  Cancer - Other  Father    .  Hypertension  Brother    ROS- All systems are reviewed and negative except as per the HPI above  Physical Exam:  Filed Vitals:   10/09/13 0535  BP: 114/91  Pulse: 100  Temp: 97.6 F (36.4 C)  Resp: 20    GEN- The patient is well appearing, alert and oriented x 3 today.  Head- normocephalic, atraumatic  Eyes- Sclera clear, conjunctiva pink  Ears- hearing intact  Oropharynx- clear  Neck- supple, no JVP  Lymph- no cervical lymphadenopathy  Lungs- Clear to ausculation bilaterally, normal work of breathing  Heart- Regular rate and rhythm, no murmurs, rubs or gallops, PMI not laterally displaced  GI- soft, NT, ND, + BS  Extremities- no clubbing, cyanosis, or edema  Neuro- strength and sensation are intact   Assessment and Plan:  1. Paroxysmal atrial fibrillation  The patient has symptomatic paroxysmal atrial fibrillation. He has failed antiarrhythmic drug therapy with flecainide and tikosyn.  Therapeutic strategies for afib and atrial flutter including medicine and ablation were discussed in detail with the patient today. Risk, benefits, and alternatives to EP study and radiofrequency ablation for afib were also discussed in detail today. These risks include but are not limited to stroke, bleeding, vascular damage, tamponade, perforation, damage to the esophagus, lungs, and other structures, pulmonary vein stenosis, worsening renal function, and death. The patient understands these risk and wishes to proceed.  TEE is reviewed PPM interrogation this am is reviewed and normal.

## 2013-10-09 NOTE — Anesthesia Preprocedure Evaluation (Addendum)
Anesthesia Evaluation  Patient identified by MRN, date of birth, ID band Patient awake    Reviewed: Allergy & Precautions, H&P , NPO status , Patient's Chart, lab work & pertinent test results  History of Anesthesia Complications Negative for: history of anesthetic complications  Airway Mallampati: I TM Distance: >3 FB Neck ROM: Full    Dental  (+) Dental Advisory Given, Poor Dentition, Loose, Upper Dentures, Partial Lower   Pulmonary former smoker,  breath sounds clear to auscultation        Cardiovascular hypertension, Pt. on medications + dysrhythmias Atrial Fibrillation Rhythm:Irregular Rate:Tachycardia     Neuro/Psych    GI/Hepatic negative GI ROS, Neg liver ROS,   Endo/Other  Hypothyroidism   Renal/GU Renal disease     Musculoskeletal   Abdominal   Peds  Hematology   Anesthesia Other Findings   Reproductive/Obstetrics                          Anesthesia Physical Anesthesia Plan  ASA: III  Anesthesia Plan: General   Post-op Pain Management:    Induction: Intravenous  Airway Management Planned: LMA  Additional Equipment:   Intra-op Plan:   Post-operative Plan: Extubation in OR  Informed Consent: I have reviewed the patients History and Physical, chart, labs and discussed the procedure including the risks, benefits and alternatives for the proposed anesthesia with the patient or authorized representative who has indicated his/her understanding and acceptance.   Dental advisory given  Plan Discussed with:   Anesthesia Plan Comments:        Anesthesia Quick Evaluation

## 2013-10-09 NOTE — Discharge Summary (Signed)
ELECTROPHYSIOLOGY PROCEDURE DISCHARGE SUMMARY    Patient ID: Marco Morris,  MRN: [id number], DOB/AGE: 01/19/1956 58 y.o.  Admit date: 10/09/2013 Discharge date: 10/10/2013  Primary Care Physician: Jilda Panda, MD Primary Cardiologist: Martinique Electrophysiologist: [name], MD  Primary Discharge Diagnosis:  Paroxysmal atrial fibrillation status post ablation this admission  Secondary Discharge Diagnosis:  1.  Hyperlipidemia 2.  Hypothyroidism 3.  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  Electrophysiology study and radiofrequency catheter ablation on 10-09-13 by Dr [name].  This study demonstrated sinus rhythm upon presentation; rotational Angiography reveals a moderate sized left atrium with a short common segment to the left superior and inferior pulmonary veins; successful electrical isolation and anatomical encircling of all four pulmonary veins with radiofrequency current. There were no inducible arrhythmias following ablation both on and off of dobutamine.  There were no early apparent complications.    Brief HPI: [name] is a 58 y.o. male with a history of paroxysmal atrial fibrillation.  They have failed medical therapy with Tikosyn and Diltiazem. Risks, benefits, and alternatives to catheter ablation of atrial fibrillation were reviewed with the patient who wished to proceed.  The patient underwent TEE prior to the procedure which demonstrated normal LV function and no LAA thrombus.    Hospital Course:  The patient was admitted and underwent EPS/RFCA of atrial fibrillation with details as outlined above.  They were monitored on telemetry overnight which demonstrated sinus rhythm.  Groin was without complication on the day of discharge.  The patient was examined by Dr Lovena Le and considered to be stable for discharge.  Wound care and restrictions were reviewed with the patient.  The patient will be seen  back by Dr Rayann Heman in 12 weeks for post ablation follow up.   The patient was recently started on Lisinopril/HCTZ by his primary care physician for hypertension. Because of the interaction with HCTZ and Tikosyn, the patient will be discharged to home on plain Lisinopril.  A PPI will be added for 6 weeks post ablation.   Discharge Vitals: Blood pressure 124/77, pulse 81, temperature 98.6 F (37 C), temperature source Oral, resp. rate 17, height 6\' 1"  (1.854 m), weight 188 lb 0.8 oz (85.3 kg), SpO2 95.00%.    Labs:   Lab Results  Component Value Date   WBC 13.6* 10/02/2013   HGB 14.6 10/02/2013   HCT 43.9 10/02/2013   MCV 87.2 10/02/2013   PLT 229.0 10/02/2013     Recent Labs Lab 10/10/13 0500  NA 139  K 4.4  CL 101  CO2 27  BUN 15  CREATININE 1.29  CALCIUM 9.4  GLUCOSE 112*      Discharge Medications:    Medication List    STOP taking these medications       lisinopril-hydrochlorothiazide 10-12.5 MG per tablet  Commonly known as:  PRINZIDE,ZESTORETIC      TAKE these medications       atorvastatin 20 MG tablet  Commonly known as:  LIPITOR  Take 20 mg by mouth every evening.     diltiazem 240 MG 24 hr capsule  Commonly known as:  CARDIZEM CD  Take 240 mg by mouth daily.     diltiazem 120 MG 24 hr capsule  Commonly known as:  CARDIZEM CD  Take 1 capsule (120 mg total) by mouth daily. To take with diltiazem CD 240 mg once daily (to total 360 mg once daily) as directed by Dr. Rayann Heman.  dofetilide 500 MCG capsule  Commonly known as:  TIKOSYN  Take 1 capsule (500 mcg total) by mouth every 12 (twelve) hours.     levothyroxine 125 MCG tablet  Commonly known as:  SYNTHROID, LEVOTHROID  Take 125 mcg by mouth daily before breakfast.     lisinopril 10 MG tablet  Commonly known as:  PRINIVIL,ZESTRIL  Take 1 tablet (10 mg total) by mouth daily.     potassium chloride SA 20 MEQ tablet  Commonly known as:  K-DUR,KLOR-CON  Take 1 tablet (20 mEq total) by mouth daily.       rivaroxaban 20 MG Tabs tablet  Commonly known as:  XARELTO  Take 1 tablet (20 mg total) by mouth daily.        Disposition:  Discharge Instructions   Diet - low sodium heart healthy    Complete by:  As directed      Increase activity slowly    Complete by:  As directed   No driving for 2 days. No lifting over 5 lbs for 1 week. No sexual activity for 1 week. You may return to work in 1 week. Keep procedure site clean & dry. If you notice increased pain, swelling, bleeding or pus, call/return!  You may shower, but no soaking baths/hot tubs/pools for 1 week.  PLEASE NOTE: your combination lisinopril/HCTZ tablet has been discontinued and changed to JUST lisinopril.          Follow-up Information   Follow up with [facility]. (10/17/13 - can come in anytime between 8am and 4pm for labwork only)    Specialty:  Cardiology   Contact information:   [address], [address] [place] [address] [phone]      Follow up with [name], MD. (01/12/14 at 10:45am)    Specialty:  Cardiology   Contact information:   [address] [place] [address] [phone]       Duration of Discharge Encounter: Greater than 30 minutes including physician time.  Signed,  Mikle Bosworth.D.

## 2013-10-09 NOTE — Anesthesia Procedure Notes (Signed)
Procedure Name: LMA Insertion Date/Time: 10/09/2013 8:02 AM Performed by: Octavio Graves Pre-anesthesia Checklist: Patient identified, Patient being monitored, Timeout performed, Emergency Drugs available and Suction available Patient Re-evaluated:Patient Re-evaluated prior to inductionOxygen Delivery Method: Circle system utilized Preoxygenation: Pre-oxygenation with 100% oxygen Intubation Type: IV induction Ventilation: Mask ventilation without difficulty LMA: LMA inserted and LMA with gastric port inserted LMA Size: 4.0 Tube type: Oral Placement Confirmation: positive ETCO2 and breath sounds checked- equal and bilateral Tube secured with: Tape Dental Injury: Teeth and Oropharynx as per pre-operative assessment  Comments: IV induction Beltrami- AM CRNA- jaw lift to assist with LMA placement- dentures and plates left in mouth- loose tooth on bottom as prior to LMA placement- LMA atraumatic   + BS = Crews.

## 2013-10-09 NOTE — Op Note (Signed)
SURGEON:  [name], MD  PREPROCEDURE DIAGNOSES: 1. Paroxysmal atrial fibrillation.  POSTPROCEDURE DIAGNOSES: 1. Paroxysmal  atrial fibrillation.  PROCEDURES: 1. Comprehensive electrophysiologic study. 2. Coronary sinus pacing and recording. 3. Three-dimensional mapping of atrial fibrillation  4. Ablation of atrial fibrillation  5. Intracardiac echocardiography. 6. Transseptal puncture of an intact septum. 7. Rotational Angiography with processing at an independent workstation 8. Arrhythmia induction with pacing with dobutamine infusion  INTRODUCTION:  Marco Morris is a 58 y.o. male with a history of paroxysmal atrial fibrillation who now presents for EP study and radiofrequency ablation.  The patient reports initially being diagnosed with atrial fibrillation after presenting with symptomatic palpitations and dizziness. The patient reports increasing frequency and duration of atrial fibrillation since that time.  The patient has failed medical therapy with Phyllis Ginger and diltiazem.  The patient therefore presents today for catheter ablation of atrial fibrillation.  DESCRIPTION OF PROCEDURE:  Informed written consent was obtained, and the patient was brought to the electrophysiology lab in a fasting state.  The patient was adequately sedated with intravenous medications as outlined in the anesthesia report.  The patient's left and right groins were prepped and draped in the usual sterile fashion by the EP lab staff.  Using a percutaneous Seldinger technique, two 7-French and one 11-French hemostasis sheaths were placed into the right common femoral vein.  His pacemaker was interrogated and baseline lead parameters were obtained.  3 Dimensional Rotational Angiography: A 5 french pigtail catheter was introduced through the right common femoral vein and advanced into the inferior venocava.  3 demential rotational angiography was then performed by power injection of 100cc of nonionic contrast.   Reprocessing at an independent work station was then performed.   This demonstrated a moderate sized left atrium.  There was a common ostium to the left superior and inferior pulmonary veins.  The pulmonary veins which were also moderate in size.   A 3 dimensional rendering of the left atrium was then merged using [employer] onto the [employer] system and registered with intracardiac echo (see below).  The pigtail catheter was then removed.  Catheter Placement:  A 7-French Biosense Webster Decapolar coronary sinus catheter was introduced through the right common femoral vein and advanced into the coronary sinus for recording and pacing from this location.  A 6-French quadripolar Josephson catheter was introduced through the right common femoral vein and advanced into the right ventricle for recording and pacing.  This catheter was then pulled back to the His bundle location.    Initial Measurements: The patient presented to the electrophysiology lab in sinus rhythm.  His PR interval measured 129 msec with a QRS duration of 96 msec and a QT interval of 396 msec.  The AH interval measured 81 msec and the HV interval measured 36 msec.     Intracardiac Echocardiography: A 10-French Biosense Webster AcuNav intracardiac echocardiography catheter was introduced through the left common femoral vein and advanced into the right atrium. Intracardiac echocardiography was performed of the left atrium, and a three-dimensional anatomical rendering of the left atrium was performed using CARTO sound technology.  The patient was noted to have a moderate sized left atrium.  The interatrial septum was prominent but not aneurysmal. All 4 pulmonary veins were visualized.  There was a short common ostium to the left superior and inferior pulmonary veins.  The pulmonary veins which were also moderate in size.   The left atrial appendage was visualized and did not reveal thrombus.  There was no evidence of  pulmonary vein stenosis.   Transseptal Puncture: The middle right common femoral vein sheath was exchanged for an 8.5 [place] SL2 transseptal sheath and transseptal access was achieved in a standard fashion using a Brockenbrough needle under biplane fluoroscopy with intracardiac echocardiography confirmation of the transseptal puncture.  Once transseptal access had been achieved, heparin was administered intravenously and intra- arterially in order to maintain an ACT of greater than 350 seconds throughout the procedure.   3D Mapping and Ablation: The His bundle catheter was removed and in its place a 3.5 mm [employer] Thermocool ablation catheter was advanced into the right atrium.  The transseptal sheath was pulled back into the IVC over a guidewire.  The ablation catheter was advanced across the transseptal hole using the wire as a guide.  The transseptal sheath was then re-advanced over the guidewire into the left atrium.  A duodecapolar Biosense Webster circular mapping catheter was introduced through the transseptal sheath and positioned over the mouth of all 4 pulmonary veins.  Three-dimensional electroanatomical mapping was performed using CARTO technology.  This demonstrated electrical activity within all four pulmonary veins at baseline. The patient underwent successful sequential electrical isolation and anatomical encircling of all four pulmonary veins using radiofrequency current with a circular mapping catheter as a guide.  Adenosine 12mg  was administered x3 and the pulmonary veins were examined without reconnection.  Measurements Following Ablation: Following ablation, dobutamine was infused up to 10 mcg/kg/min with no inducible atrial fibrillation, atrial tachycardia, atrial flutter, or sustained PACs. Ventricular pacing was performed, which revealed VA dissociation when pacing at 600 msec.  Rapid atrial pacing was performed, which revealed an AV Wenckebach cycle length of  290 msec.  Electroisolation was then again confirmed in all four pulmonary veins.  Pacing was performed along the ablation line which confirmed entrance and exit block.  The procedure was therefore considered completed.  All catheters were removed, and the sheaths were aspirated and flushed.  The patient was transferred to the recovery area for sheath removal per protocol.  A limited bedside transthoracic echocardiogram revealed no pericardial effusion.  There were no early apparent complications.  CONCLUSIONS: 1. Sinus rhythm upon presentation.   2. Rotational Angiography reveals a moderate sized left atrium with a short common segment to the left superior and inferior pulmonary veins. 3. Successful electrical isolation and anatomical encircling of all four pulmonary veins with radiofrequency current. 4. No inducible arrhythmias following ablation both on and off of dobutamine 5. No early apparent complications.   Alyx Mcguirk,MD 10:17 AM 10/09/2013

## 2013-10-09 NOTE — Transfer of Care (Signed)
Immediate Anesthesia Transfer of Care Note  Patient: Marco Morris  Procedure(s) Performed: Procedure(s): ATRIAL FIBRILLATION ABLATION ([name])  Patient Location: PACU and Cath Lab  Anesthesia Type:General  Level of Consciousness: awake, alert  and oriented  Airway & Oxygen Therapy: Patient Spontanous Breathing and Patient connected to nasal cannula oxygen  Post-op Assessment: Report given to PACU RN and Post -op Vital signs reviewed and stable  Post vital signs: Reviewed and stable  Complications: No apparent anesthesia complications

## 2013-10-10 ENCOUNTER — Other Ambulatory Visit: Payer: Self-pay | Admitting: *Deleted

## 2013-10-10 DIAGNOSIS — I4891 Unspecified atrial fibrillation: Secondary | ICD-10-CM

## 2013-10-10 LAB — BASIC METABOLIC PANEL
BUN: 15 mg/dL (ref 6–23)
CHLORIDE: 101 meq/L (ref 96–112)
CO2: 27 meq/L (ref 19–32)
Calcium: 9.4 mg/dL (ref 8.4–10.5)
Creatinine, Ser: 1.29 mg/dL (ref 0.50–1.35)
GFR calc Af Amer: 68 mL/min — ABNORMAL LOW (ref >90)
GFR calc non Af Amer: 59 mL/min — ABNORMAL LOW (ref >90)
GLUCOSE: 112 mg/dL — AB (ref 70–99)
POTASSIUM: 4.4 meq/L (ref 3.7–5.3)
SODIUM: 139 meq/L (ref 137–147)

## 2013-10-10 MED ORDER — LISINOPRIL 10 MG PO TABS: 10.0000 mg | ORAL_TABLET | Freq: Every day | ORAL | Status: DC

## 2013-10-10 NOTE — Anesthesia Postprocedure Evaluation (Signed)
  Anesthesia Post-op Note  Patient: Marco Morris  Procedure(s) Performed: Procedure(s): ATRIAL FIBRILLATION ABLATION ([name])  Patient Location: PACU  Anesthesia Type:General  Level of Consciousness: awake and alert   Airway and Oxygen Therapy: Patient Spontanous Breathing  Post-op Pain: none  Post-op Assessment: Post-op Vital signs reviewed, Patient's Cardiovascular Status Stable, Respiratory Function Stable, Patent Airway, No signs of Nausea or vomiting and Pain level controlled  Post-op Vital Signs: Reviewed and stable  Last Vitals:  Filed Vitals:   10/10/13 0752  BP: 119/73  Pulse: 80  Temp: 36.9 C  Resp: 16    Complications: No apparent anesthesia complications

## 2013-10-17 ENCOUNTER — Telehealth: Payer: Self-pay | Admitting: Internal Medicine

## 2013-10-17 ENCOUNTER — Other Ambulatory Visit (INDEPENDENT_AMBULATORY_CARE_PROVIDER_SITE_OTHER): Payer: Managed Care, Other (non HMO)

## 2013-10-17 DIAGNOSIS — I4891 Unspecified atrial fibrillation: Secondary | ICD-10-CM

## 2013-10-17 LAB — MDC_IDC_ENUM_SESS_TYPE_INCLINIC
Lead Channel Pacing Threshold Amplitude: 0.75 V
Lead Channel Pacing Threshold Pulse Width: 0.4 ms
Lead Channel Sensing Intrinsic Amplitude: 12 mV

## 2013-10-17 LAB — BASIC METABOLIC PANEL
BUN: 11 mg/dL (ref 6–23)
CO2: 26 meq/L (ref 19–32)
CREATININE: 1.2 mg/dL (ref 0.4–1.5)
Calcium: 9.6 mg/dL (ref 8.4–10.5)
Chloride: 103 mEq/L (ref 96–112)
GFR: 63.75 mL/min (ref >60.00)
GLUCOSE: 104 mg/dL — AB (ref 70–99)
Potassium: 3.8 mEq/L (ref 3.5–5.1)
Sodium: 137 mEq/L (ref 135–145)

## 2013-10-17 NOTE — Telephone Encounter (Signed)
Left message for him to return my call.

## 2013-10-17 NOTE — Progress Notes (Signed)
Pacemaker check in clinic. Normal device function. Thresholds, sensing, impedances consistent with previous measurements. Device programmed to maximize longevity. No high ventricular rates noted. Device programmed at appropriate safety margins. Histogram distribution appropriate for patient activity level. Device programmed to optimize intrinsic conduction. Estimated longevity 10 years.

## 2013-10-17 NOTE — Telephone Encounter (Signed)
Letter done in May and patient has turned into his employer

## 2013-10-17 NOTE — Telephone Encounter (Signed)
Pt needs a letter for him to find a job. Please call patient, needs letter done by Monday.

## 2013-10-20 ENCOUNTER — Encounter: Payer: Managed Care, Other (non HMO) | Admitting: Internal Medicine

## 2013-10-23 ENCOUNTER — Telehealth: Payer: Self-pay | Admitting: Internal Medicine

## 2013-10-23 NOTE — Telephone Encounter (Signed)
Informed patient of results/KLanier,RN  

## 2013-10-23 NOTE — Telephone Encounter (Signed)
Patient would like blood results. Please call and advise.

## 2013-11-05 ENCOUNTER — Encounter: Payer: Self-pay | Admitting: Internal Medicine

## 2013-11-05 ENCOUNTER — Telehealth: Payer: Self-pay | Admitting: Internal Medicine

## 2013-11-05 NOTE — Telephone Encounter (Signed)
New message     Talk to Marco Morris about getting recertified so that he can go back to work.

## 2013-11-06 ENCOUNTER — Telehealth: Payer: Self-pay | Admitting: Internal Medicine

## 2013-11-06 NOTE — Telephone Encounter (Signed)
Called patient to find out his need for RTW status. He states that he dropped off paperwork at the front desk this AM for Dr.Allred to sign. Would like to pick up ASAP. Advised will call him when ready.

## 2013-11-06 NOTE — Telephone Encounter (Signed)
Walk In pt Form " Physical Requirements Form " Dropped Off gave to [facility] 6.25.15/km

## 2013-11-07 NOTE — Telephone Encounter (Signed)
Follow up     Patient calling back checking on status of form that was drop off .

## 2013-11-07 NOTE — Telephone Encounter (Signed)
Spoke with patient and advised Dr Rayann Heman not here today to sign and I will call him Mon or Tues when ready

## 2013-11-10 ENCOUNTER — Telehealth: Payer: Self-pay | Admitting: Internal Medicine

## 2013-11-10 NOTE — Telephone Encounter (Signed)
Spoke with patient and let him know his papers are ready

## 2013-11-10 NOTE — Telephone Encounter (Signed)
Follow Up:  Pt states he is waiting on a call back from [place] with the status of some paperwork.

## 2013-11-10 NOTE — Telephone Encounter (Signed)
Patient dropped off a form on 11/06/13, he cant return to work its signed by Dr Rayann Heman. He would like this done ASAP. He is eager to return to work. Please call and advise.

## 2014-01-07 ENCOUNTER — Encounter: Payer: Self-pay | Admitting: Internal Medicine

## 2014-01-07 ENCOUNTER — Ambulatory Visit (INDEPENDENT_AMBULATORY_CARE_PROVIDER_SITE_OTHER): Payer: Managed Care, Other (non HMO) | Admitting: Internal Medicine

## 2014-01-07 ENCOUNTER — Ambulatory Visit (HOSPITAL_COMMUNITY): Payer: Managed Care, Other (non HMO) | Attending: Internal Medicine

## 2014-01-07 VITALS — BP 122/78 | HR 104 | Ht 73.0 in | Wt 189.2 lb

## 2014-01-07 DIAGNOSIS — I480 Paroxysmal atrial fibrillation: Secondary | ICD-10-CM

## 2014-01-07 DIAGNOSIS — I4891 Unspecified atrial fibrillation: Secondary | ICD-10-CM

## 2014-01-07 DIAGNOSIS — I495 Sick sinus syndrome: Secondary | ICD-10-CM | POA: Diagnosis not present

## 2014-01-07 LAB — MDC_IDC_ENUM_SESS_TYPE_INCLINIC
Brady Statistic RV Percent Paced: 0 %
MDC IDC MSMT LEADCHNL RV PACING THRESHOLD AMPLITUDE: 0.75 V
MDC IDC MSMT LEADCHNL RV PACING THRESHOLD PULSEWIDTH: 0.4 ms
MDC IDC MSMT LEADCHNL RV SENSING INTR AMPL: 12 mV

## 2014-01-07 MED ORDER — DILTIAZEM HCL ER COATED BEADS 240 MG PO CP24: 240.0000 mg | ORAL_CAPSULE | Freq: Every day | ORAL | Status: DC

## 2014-01-07 NOTE — Progress Notes (Signed)
Primary Care Physician: Jilda Panda, MD Referring Physician:  Dr Martinique   Loyce D Marco Morris is a 58 y.o. male with a h/o paroxysmal atrial fibrillation who is referred for EP follow-up.  He reports that he was first diagnosed with atrial fibrillation in 2003.  This initially was managed with beta blocker therapy. Due to excessive fatigue he was switched to diltiazem.   He reports increasing frequency and duration of atrial fibrillation.  He reports symptoms of palpitations, decreased exercise tolerance and fatigue with afib.  He has also had several episodes of abrupt presyncope with prolonged pauses documented.  He underwent leadless pacing by me 3/15 with termination of his pauses.  He was also started on tikosyn for afib management.  He previously failed medical therapy with flecainide.  Presently, he has ongoing daily afib.  Episodes last minutes to hours and are associated with palpitations and SOB.  He is unaware of triggers/ precipitants. Due to symptomatic afib and failing  antiarrythmic's, he underwent PVI  5/28. He returns today for 3 month f/u. He reports minimal palpitations overall feels well. He has a chadsvasc score of 1 and being three months out from procedure, can stop anticoagulant.  Leadless pacer interrogated today implanted due to h/o post termination pauses.   Today, he denies symptoms of chest pain,  orthopnea, PND, lower extremity edema, or neurologic sequela. The patient is tolerating medications without difficulties and is otherwise without complaint today.   Past Medical History  Diagnosis Date  . PAF (paroxysmal atrial fibrillation)     a.  anticoagulated with Xarelto b. s/p Tikosyn loading 07/2013  . Hypercholesterolemia   . History of renal calculi   . Hypothyroidism   . Dizzy   . Shortness of breath     shortness of breath even at rest  . Blood dyscrasia     high risk for blood clot formation  . Neuromuscular disorder     numbness left foot  .  Tachycardia-bradycardia syndrome     post termination pauses with afib and symptoms of presyncope  . Pacemaker   . Hypertension   . Chronic kidney disease     "left kidney w/~ 25% function"  . Renal stone     right   Past Surgical History  Procedure Laterality Date  . Cholecystectomy  ~ 2000  . Lithotripsy  2004; ~ 05/2013  . Tee without cardioversion [name] 10/08/2013    Procedure: TRANSESOPHAGEAL ECHOCARDIOGRAM (TEE);  Surgeon: Sanda Klein, MD;  Location: [facility] ENDOSCOPY;  Service: Cardiovascular;  Laterality: [name];  . Atrial fibrillation ablation  10/09/2013  . Insert / replace / remove pacemaker  07/18/2013    STJ Leadless pacemaker implanted by Dr Rayann Heman for symptomatic pauses    Current Outpatient Prescriptions  Medication Sig Dispense Refill  . atorvastatin (LIPITOR) 20 MG tablet Take 20 mg by mouth every evening.       . diltiazem (CARDIZEM CD) 120 MG 24 hr capsule Take 1 capsule (120 mg total) by mouth daily. To take with diltiazem CD 240 mg once daily (to total 360 mg once daily) as directed by Dr. Rayann Heman.  30 capsule  4  . diltiazem (CARDIZEM CD) 240 MG 24 hr capsule Take 240 mg by mouth daily. To take with diltiazem CD 120 mg once daily (to total 360 mg once daily) as directed by Dr. Rayann Heman.      . dofetilide (TIKOSYN) 500 MCG capsule Take 1 capsule (500 mcg total) by mouth every 12 (twelve) hours.  60 capsule  6  . levothyroxine (SYNTHROID, LEVOTHROID) 125 MCG tablet Take 125 mcg by mouth daily before breakfast.       . lisinopril (PRINIVIL,ZESTRIL) 10 MG tablet Take 1 tablet (10 mg total) by mouth daily.  30 tablet  3  . potassium chloride SA (K-DUR,KLOR-CON) 20 MEQ tablet Take 1 tablet (20 mEq total) by mouth daily.  30 tablet  6  . Rivaroxaban (XARELTO) 20 MG TABS tablet Take 1 tablet (20 mg total) by mouth daily.  30 tablet  1   No current facility-administered medications for this visit.    Allergies  Allergen Reactions  . Codeine Nausea And Vomiting    History    Social History  . Marital Status: Married    Spouse Name: [name]    Number of Children: 2  . 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 Smoking status: Former Smoker -- 1.00 packs/day for 14 years    Types: Cigarettes    Quit date: 05/15/1985  . Smokeless tobacco: Former Systems developer    Types: Chew    Quit date: 01/13/2013  . Alcohol Use: 0.6 oz/week    1 Cans of beer per week  . Drug Use: No  . Sexual Activity: Not Currently   Other Topics Concern  . 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    Family History  Problem Relation Age of Onset  . Heart attack Mother   . Stroke Mother   . Diabetes Mother   . Cancer - Ovarian Mother   . Leukemia Mother   . Heart attack Father   . Stroke Father   . Diabetes Father   . Cancer - Other Father   . Hypertension Brother     ROS- All systems are reviewed and negative except as per the HPI above  Physical Exam: Filed Vitals:   01/07/14 1522  BP: 122/78  Pulse: 104  Height: 6\' 1"  (1.854 m)  Weight: 85.821 kg (189 lb 3.2 oz)    GEN- The patient is well appearing, alert and oriented x 3 today.   Head- normocephalic, atraumatic Eyes-  Sclera clear, conjunctiva pink Ears- hearing intact Oropharynx- clear Neck- supple, no JVP Lymph- no cervical lymphadenopathy Lungs- Clear to ausculation bilaterally, normal work of breathing Heart- Regular rate and rhythm, no murmurs, rubs or gallops, PMI not laterally displaced GI- soft, NT, ND, + BS Extremities- no clubbing, cyanosis, or edema Neuro- strength and sensation are intact  EKG today reveals sinus rhythm at 104 bpm, , PR 134 QTc 468.  Leadless Pacer interrogated  today see report.  Assessment and Plan:  1. Paroxysmal atrial fibrillation/ atrial flutter, S/P PVI ,5/28/t5 Maintaining NSR.  Stop tikosyn and diltiazem 120 mg today but continue 240 mg of diltiazem. Stop  xarelto with Chadsvasc of 1.  2. Tachycardia/ bradycardia syndrome Normal pacemaker function See Pace Art report No changes today  3. Recheck 3 months.

## 2014-01-07 NOTE — Patient Instructions (Addendum)
Your physician recommends that you schedule a follow-up appointment in: 3 months with Dr Rayann Heman  Your physician has recommended you make the following change in your medication:  1) Stop Diltiazem 120mg  2) Stop Tikosyn 3) Stop Xarelto

## 2014-01-12 ENCOUNTER — Encounter: Payer: Managed Care, Other (non HMO) | Admitting: Internal Medicine

## 2014-01-12 ENCOUNTER — Ambulatory Visit (HOSPITAL_COMMUNITY): Payer: Managed Care, Other (non HMO)

## 2014-02-05 ENCOUNTER — Other Ambulatory Visit (HOSPITAL_COMMUNITY): Payer: Self-pay | Admitting: Physician Assistant

## 2014-03-02 ENCOUNTER — Other Ambulatory Visit: Payer: Self-pay

## 2014-03-02 MED ORDER — POTASSIUM CHLORIDE CRYS ER 20 MEQ PO TBCR: 20.0000 meq | EXTENDED_RELEASE_TABLET | Freq: Every day | ORAL | Status: DC

## 2014-03-06 ENCOUNTER — Other Ambulatory Visit: Payer: Self-pay

## 2014-03-06 MED ORDER — LISINOPRIL 10 MG PO TABS: ORAL_TABLET | ORAL | Status: AC

## 2014-04-06 ENCOUNTER — Encounter: Payer: Self-pay | Admitting: Internal Medicine

## 2014-04-06 ENCOUNTER — Ambulatory Visit (INDEPENDENT_AMBULATORY_CARE_PROVIDER_SITE_OTHER): Payer: Managed Care, Other (non HMO) | Admitting: Internal Medicine

## 2014-04-06 VITALS — BP 112/78 | HR 82 | Ht 73.0 in | Wt 189.4 lb

## 2014-04-06 DIAGNOSIS — I495 Sick sinus syndrome: Secondary | ICD-10-CM

## 2014-04-06 DIAGNOSIS — I480 Paroxysmal atrial fibrillation: Secondary | ICD-10-CM

## 2014-04-06 NOTE — Patient Instructions (Signed)
Your physician recommends that you schedule a follow-up appointment in: 3 months with Dr. Rayann Heman and research

## 2014-04-06 NOTE — Progress Notes (Signed)
Primary Care Physician: Jilda Panda, MD Referring Physician:  Dr Martinique   Marco Morris is a 58 y.o. male with a h/o paroxysmal atrial fibrillation who is referred for EP follow-up.  He is unaware of any afib since his last visit.  He denies any further pauses/ bradycardias. Today, he denies symptoms of chest pain,  orthopnea, PND, lower extremity edema, or neurologic sequela. The patient is tolerating medications without difficulties and is otherwise without complaint today.   Past Medical History  Diagnosis Date  . PAF (paroxysmal atrial fibrillation)     a.  anticoagulated with Xarelto b. s/p Tikosyn loading 07/2013  . Hypercholesterolemia   . History of renal calculi   . Hypothyroidism   . Dizzy   . Shortness of breath     shortness of breath even at rest  . Blood dyscrasia     high risk for blood clot formation  . Neuromuscular disorder     numbness left foot  . Tachycardia-bradycardia syndrome     post termination pauses with afib and symptoms of presyncope  . Pacemaker   . Hypertension   . Chronic kidney disease     "left kidney w/~ 25% function"  . Renal stone     right   Past Surgical History  Procedure Laterality Date  . Cholecystectomy  ~ 2000  . Lithotripsy  2004; ~ 05/2013  . Tee without cardioversion [name] 10/08/2013    Procedure: TRANSESOPHAGEAL ECHOCARDIOGRAM (TEE);  Surgeon: Sanda Klein, MD;  Location: [facility] ENDOSCOPY;  Service: Cardiovascular;  Laterality: [name];  . Atrial fibrillation ablation  10/09/2013  . Insert / replace / remove pacemaker  07/18/2013    STJ Leadless pacemaker implanted by Dr Rayann Heman for symptomatic pauses    Current Outpatient Prescriptions  Medication Sig Dispense Refill  . aspirin 81 MG tablet Take 81 mg by mouth daily.    Marland Kitchen atorvastatin (LIPITOR) 20 MG tablet Take 20 mg by mouth every evening.     . diltiazem (CARDIZEM CD) 240 MG 24 hr capsule Take 1 capsule (240 mg total) by mouth daily. To take with diltiazem CD 120 mg once daily  (to total 360 mg once daily) as directed by Dr. Rayann Heman. (Patient taking differently: Take 240 mg by mouth daily. )    . levothyroxine (SYNTHROID, LEVOTHROID) 125 MCG tablet Take 125 mcg by mouth daily before breakfast.     . lisinopril (PRINIVIL,ZESTRIL) 10 MG tablet TAKE 1 TABLET (10 MG TOTAL) BY MOUTH DAILY. 30 tablet 0  . potassium chloride SA (K-DUR,KLOR-CON) 20 MEQ tablet Take 1 tablet (20 mEq total) by mouth daily. 30 tablet 3   No current facility-administered medications for this visit.    Allergies  Allergen Reactions  . Codeine Nausea And Vomiting    History   Social History  . Marital Status: Married    Spouse Name: [name]    Number of Children: 2  . 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 Smoking status: Former Smoker -- 1.00 packs/day for 14 years    Types: Cigarettes    Quit date: 05/15/1985  . Smokeless tobacco: Former Systems developer    Types: Chew    Quit date: 01/13/2013  . Alcohol Use: 0.6 oz/week    1 Cans of beer per week  . Drug Use: No  . Sexual Activity: Not Currently   Other Topics Concern  . 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    Family History  Problem Relation Age of Onset  . Heart attack Mother   . Stroke Mother   . Diabetes Mother   . Cancer - Ovarian Mother   . Leukemia Mother   . Heart attack Father   . Stroke Father   . Diabetes Father   . Cancer - Other Father   . Hypertension Brother     ROS- All systems are reviewed and negative except as per the HPI above  Physical Exam: Filed Vitals:   04/06/14 1403  BP: 112/78  Pulse: 82  Height: 6\' 1"  (1.854 m)  Weight: 189 lb 6.4 oz (85.911 kg)    GEN- The patient is well appearing, alert and oriented x 3 today.   Head- normocephalic, atraumatic Eyes-  Sclera clear, conjunctiva pink Ears- hearing intact Oropharynx- clear Neck- supple, no JVP Lymph- no cervical  lymphadenopathy Lungs- Clear to ausculation bilaterally, normal work of breathing Heart- Regular rate and rhythm, no murmurs, rubs or gallops, PMI not laterally displaced GI- soft, NT, ND, + BS Extremities- no clubbing, cyanosis, or edema Neuro- strength and sensation are intact  EKG today reveals sinus rhythm at 85 bpm, normal ekg  Assessment and Plan:  1. Paroxysmal atrial fibrillation/ atrial flutter, S/P PVI ,10/09/13 Maintaining NSR off of AAD No changes today  2. Tachycardia/ bradycardia syndrome Normal pacemaker function See Claudia Desanctis Art report No changes today  Return in 3 months

## 2014-04-23 ENCOUNTER — Encounter (HOSPITAL_COMMUNITY): Payer: Self-pay | Admitting: Internal Medicine

## 2014-06-29 ENCOUNTER — Encounter: Payer: Managed Care, Other (non HMO) | Admitting: Internal Medicine

## 2014-07-08 ENCOUNTER — Encounter: Payer: Managed Care, Other (non HMO) | Admitting: Internal Medicine

## 2014-08-24 ENCOUNTER — Ambulatory Visit (INDEPENDENT_AMBULATORY_CARE_PROVIDER_SITE_OTHER): Payer: Managed Care, Other (non HMO) | Admitting: Internal Medicine

## 2014-08-24 ENCOUNTER — Encounter: Payer: Self-pay | Admitting: Internal Medicine

## 2014-08-24 VITALS — BP 130/82 | HR 75 | Ht 73.0 in | Wt 189.6 lb

## 2014-08-24 DIAGNOSIS — I10 Essential (primary) hypertension: Secondary | ICD-10-CM | POA: Diagnosis not present

## 2014-08-24 DIAGNOSIS — I495 Sick sinus syndrome: Secondary | ICD-10-CM

## 2014-08-24 DIAGNOSIS — I480 Paroxysmal atrial fibrillation: Secondary | ICD-10-CM

## 2014-08-24 LAB — MDC_IDC_ENUM_SESS_TYPE_INCLINIC
Brady Statistic RV Percent Paced: 0 %
Lead Channel Impedance Value: 480 Ohm
Lead Channel Pacing Threshold Amplitude: 0.5 V
Lead Channel Sensing Intrinsic Amplitude: 12 mV
MDC IDC MSMT LEADCHNL RV PACING THRESHOLD PULSEWIDTH: 0.4 ms

## 2014-08-24 NOTE — Progress Notes (Signed)
Electrophysiology Office Note   Date:  08/24/2014   ID:  Marco Morris, DOB [birth year], MRN [id number]  PCP:  Jilda Panda, MD  Cardiologist:  Dr Martinique Primary Electrophysiologist: [name], MD    Chief Complaint  Patient presents with  . Atrial Fibrillation    paroxysmal     History of Present Illness: Marco Morris is a 59 y.o. male who presents today for electrophysiology evaluation.   He is doing well without any symptoms of afib.  Denies any presyncope or syncope.  Today, he denies symptoms of palpitations, chest pain, shortness of breath, orthopnea, PND, lower extremity edema, claudication,  bleeding, or neurologic sequela. The patient is tolerating medications without difficulties and is otherwise without complaint today.    Past Medical History  Diagnosis Date  . PAF (paroxysmal atrial fibrillation)     a.  anticoagulated with Xarelto b. s/p Tikosyn loading 07/2013  . Hypercholesterolemia   . History of renal calculi   . Hypothyroidism   . Dizzy   . Shortness of breath     shortness of breath even at rest  . Blood dyscrasia     high risk for blood clot formation  . Neuromuscular disorder     numbness left foot  . Tachycardia-bradycardia syndrome     post termination pauses with afib and symptoms of presyncope  . Pacemaker   . Hypertension   . Chronic kidney disease     "left kidney w/~ 25% function"  . Renal stone     right   Past Surgical History  Procedure Laterality Date  . Cholecystectomy  ~ 2000  . Lithotripsy  2004; ~ 05/2013  . Tee without cardioversion [name] 10/08/2013    Procedure: TRANSESOPHAGEAL ECHOCARDIOGRAM (TEE);  Surgeon: Sanda Klein, MD;  Location: [facility] ENDOSCOPY;  Service: Cardiovascular;  Laterality: [name];  . Atrial fibrillation ablation  10/09/2013  . Insert / replace / remove pacemaker  07/18/2013    STJ Leadless pacemaker implanted by Dr Rayann Heman for symptomatic pauses  . Permanent pacemaker insertion [name] 07/18/2013    Procedure:  PERMANENT PACEMAKER INSERTION;  Surgeon: Coralyn Mark, MD;  Location: [place] CATH LAB;  Service: Cardiovascular;  Laterality: [name];  . Atrial fibrillation ablation [name] 10/09/2013    Procedure: ATRIAL FIBRILLATION ABLATION;  Surgeon: Coralyn Mark, MD;  Location: [place] CATH LAB;  Service: Cardiovascular;  Laterality: [name];     Current Outpatient Prescriptions  Medication Sig Dispense Refill  . aspirin 81 MG tablet Take 81 mg by mouth daily.    Marland Kitchen atorvastatin (LIPITOR) 20 MG tablet Take 20 mg by mouth every evening.     . diltiazem (CARDIZEM CD) 240 MG 24 hr capsule Take 240 mg by mouth daily.    Marland Kitchen levothyroxine (SYNTHROID, LEVOTHROID) 125 MCG tablet Take 125 mcg by mouth daily before breakfast.     . lisinopril (PRINIVIL,ZESTRIL) 10 MG tablet TAKE 1 TABLET (10 MG TOTAL) BY MOUTH DAILY. 30 tablet 0  . potassium chloride SA (K-DUR,KLOR-CON) 20 MEQ tablet Take 1 tablet (20 mEq total) by mouth daily. 30 tablet 3   No current facility-administered medications for this visit.    Allergies:   Codeine   Social History:  The patient  reports that he quit smoking about 29 years ago. His smoking use included Cigarettes. He has a 14 pack-year smoking history. He quit smokeless tobacco use about 19 months ago. His smokeless tobacco use included Chew. He reports that he drinks about 0.6 oz of alcohol per week. He  reports that he does not use illicit drugs.   Family History:  The patient's family history includes Cancer - Other in his father; Cancer - Ovarian in his mother; Diabetes in his father and mother; Heart attack in his father and mother; Hypertension in his brother; Leukemia in his mother; Stroke in his father and mother.    ROS:  Please see the history of present illness.   All other systems are reviewed and negative.    PHYSICAL EXAM: VS:  BP 130/82 mmHg  Pulse 75  Ht 6\' 1"  (1.854 m)  Wt 189 lb 9.6 oz (86.002 kg)  BMI 25.02 kg/m2 , BMI Body mass index is 25.02 kg/(m^2). GEN: Well nourished,  well developed, in no acute distress HEENT: normal Neck: no JVD, carotid bruits, or masses Cardiac: RRR; no murmurs, rubs, or gallops,no edema  Respiratory:  clear to auscultation bilaterally, normal work of breathing GI: soft, nontender, nondistended, + BS MS: no deformity or atrophy Skin: warm and dry  Neuro:  Strength and sensation are intact Psych: euthymic mood, full affect  EKG:  EKG is ordered today. The ekg ordered today shows sinus rhythm  Device interrogation is reviewed today in detail.  See PaceArt for details.   Recent Labs: 10/02/2013: Hemoglobin 14.6; Platelets 229.0 10/17/2013: BUN 11; Creatinine 1.2; Potassium 3.8; Sodium 137    Lipid Panel     Component Value Date/Time   CHOL  12/23/2008 0645    116        ATP III CLASSIFICATION:  <200     mg/dL   Desirable  200-239  mg/dL   Borderline High  >=240    mg/dL   High          TRIG 138 12/23/2008 0645   HDL 35* 12/23/2008 0645   CHOLHDL 3.3 12/23/2008 0645   VLDL 28 12/23/2008 0645   LDLCALC  12/23/2008 0645    53        Total Cholesterol/HDL:CHD Risk Coronary Heart Disease Risk Table                     Men   Women  1/2 Average Risk   3.4   3.3  Average Risk       5.0   4.4  2 X Average Risk   9.6   7.1  3 X Average Risk  23.4   11.0        Use the calculated Patient Ratio above and the CHD Risk Table to determine the patient's CHD Risk.        ATP III CLASSIFICATION (LDL):  <100     mg/dL   Optimal  100-129  mg/dL   Near or Above                    Optimal  130-159  mg/dL   Borderline  160-189  mg/dL   High  >190     mg/dL   Very High     Wt Readings from Last 3 Encounters:  08/24/14 189 lb 9.6 oz (86.002 kg)  04/06/14 189 lb 6.4 oz (85.911 kg)  01/07/14 189 lb 3.2 oz (85.821 kg)    ASSESSMENT AND PLAN:  1.  Paroxysmal atrial fibrillation Resolved s/p ablation Stop diltiazem Has not required AAD therapy post ablation chads2vasc score is 1.  On asa  2. Post termination  pauses Resolved s/p ablation Normal pacemaker function See Pace Art report No changes today  3. HTN Will need to  follow closely off of diltiazem May need to increase lisinopril  He is instructed to follow BP at home   Current medicines are reviewed at length with the patient today.   The patient does not have concerns regarding his medicines.  The following changes were made today:  none   Follow-up: return to see Dr Martinique in 6 months,  I will see in a year  Signed, [name], MD  08/24/2014 11:10 AM     [facility] [address] [place] [address] [phone] (office) [phone] (fax)

## 2014-08-24 NOTE — Patient Instructions (Signed)
Your physician wants you to follow-up in: Dr Martinique and research in 6 months and 12 months with Dr Vallery Ridge will receive a reminder letter in the mail two months in advance. If you don't receive a letter, please call our office to schedule the follow-up appointment.  Your physician has recommended you make the following change in your medication:  1) Stop Diltiazem

## 2015-02-10 ENCOUNTER — Telehealth: Payer: Self-pay | Admitting: Cardiology

## 2015-02-10 NOTE — Telephone Encounter (Signed)
The patient called in wanting to get an appointment with Dr. Martinique.  It was recommended by Dr. Rayann Heman that the patient see Dr. Martinique for an appointment.  Please call him at [phone].

## 2015-02-17 ENCOUNTER — Telehealth: Payer: Self-pay

## 2015-02-17 NOTE — Telephone Encounter (Signed)
Patient called received a message needs appointment with Dr.Jordan.Advised Dr.Jordan's schedule is full,call back the end of month to schedule 05/2015 appointment.Advised to call back if he needs a sooner appointment.

## 2015-03-01 ENCOUNTER — Ambulatory Visit (INDEPENDENT_AMBULATORY_CARE_PROVIDER_SITE_OTHER): Payer: Managed Care, Other (non HMO) | Admitting: Nurse Practitioner

## 2015-03-01 ENCOUNTER — Encounter: Payer: Self-pay | Admitting: Nurse Practitioner

## 2015-03-01 VITALS — BP 122/78 | HR 79 | Ht 73.0 in | Wt 188.8 lb

## 2015-03-01 DIAGNOSIS — I480 Paroxysmal atrial fibrillation: Secondary | ICD-10-CM

## 2015-03-01 DIAGNOSIS — I495 Sick sinus syndrome: Secondary | ICD-10-CM | POA: Diagnosis not present

## 2015-03-01 DIAGNOSIS — I10 Essential (primary) hypertension: Secondary | ICD-10-CM | POA: Diagnosis not present

## 2015-03-01 MED ORDER — DILTIAZEM HCL ER COATED BEADS 120 MG PO CP24: 120.0000 mg | ORAL_CAPSULE | Freq: Every day | ORAL | Status: DC

## 2015-03-01 NOTE — Patient Instructions (Addendum)
Medication Instructions:   START TAKING DILTIAZEM 120 MG ONCE A DAY    Labwork: NONE ORDER TODAY    Testing/Procedures: .NONO    Follow-Up:  Your physician wants you to follow-up in:  IN  [place] will receive a reminder letter in the mail two months in advance. If you don't receive a letter, please call our office to schedule the follow-up appointment.       Any Other Special Instructions Will Be Listed Below (If Applicable).

## 2015-03-01 NOTE — Progress Notes (Signed)
Electrophysiology Office Note Date: 03/01/2015  ID:  Marco Morris, DOB 05-19-1955, MRN [id number]  PCP: Marco Panda, MD Primary Cardiologist: Marco Morris Electrophysiologist: Marco Morris  CC: Pacemaker follow-up  Marco Morris is a 59 y.o. male seen today for Marco Morris.  He presents today for routine electrophysiology followup.  Since last being seen in our clinic, the patient reports doing relatively well.  He has had intermittent palpitations that only last seconds which feel different from his previous AF episodes.  He denies chest pain, palpitations, dyspnea, PND, orthopnea, nausea, vomiting, dizziness, syncope.  Device History: STJ leadless PPM implanted 2015 for post termination pauses   Past Medical History  Diagnosis Date  . PAF (paroxysmal atrial fibrillation) (HCC)     a.  anticoagulated with Xarelto b. s/p Tikosyn loading 07/2013 c. s/p PVI  . Hypercholesterolemia   . History of renal calculi   . Hypothyroidism   . Blood dyscrasia     high risk for blood clot formation  . Neuromuscular disorder (HCC)     numbness left foot  . 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 Hypertension   . Chronic kidney disease    Past Surgical History  Procedure Laterality Date  . Cholecystectomy  ~ 2000  . Lithotripsy  2004; ~ 05/2013  . Tee without cardioversion [name] 10/08/2013    Procedure: TRANSESOPHAGEAL ECHOCARDIOGRAM (TEE);  Surgeon: Marco Klein, MD;  Location: [facility] ENDOSCOPY;  Service: Cardiovascular;  Laterality: [name];  . Permanent pacemaker insertion [name] 07/18/2013    STJ Leadless pacemaker implanted by Marco Morris for symptomatic pauses  . Atrial fibrillation ablation [name] 10/09/2013    Procedure: ATRIAL FIBRILLATION ABLATION;  Surgeon: Marco Mark, MD;  Location: [place] CATH LAB;  Service: Cardiovascular;  Laterality: [name];    Current Outpatient Prescriptions  Medication Sig Dispense Refill  . aspirin 81 MG tablet Take  81 mg by mouth daily.    Marland Kitchen atorvastatin (LIPITOR) 20 MG tablet Take 20 mg by mouth every evening.     Marland Kitchen levothyroxine (SYNTHROID, LEVOTHROID) 125 MCG tablet Take 125 mcg by mouth daily before breakfast.     . lisinopril (PRINIVIL,ZESTRIL) 10 MG tablet TAKE 1 TABLET (10 MG TOTAL) BY MOUTH DAILY. 30 tablet 0  . potassium chloride SA (K-DUR,KLOR-CON) 20 MEQ tablet Take 1 tablet (20 mEq total) by mouth daily. 30 tablet 3  . diltiazem (CARDIZEM CD) 120 MG 24 hr capsule Take 1 capsule (120 mg total) by mouth daily. 90 capsule 3   No current facility-administered medications for this visit.    Allergies:   Codeine   Social History: Social History   Social History  . Marital Status: Married    Spouse Name: [name]  . Number of Children: 2  . Years of Education: [name]   Occupational History  . DRIVER Marco Morris   Social History Main Topics  . Smoking status: Former Smoker -- 1.00 packs/day for 14 years    Types: Cigarettes    Quit date: 05/15/1985  . Smokeless tobacco: Former Systems developer    Types: Chew    Quit date: 01/13/2013  . Alcohol Use: 0.6 oz/week    1 Cans of beer per week  . Drug Use: No  . Sexual Activity: Not Currently   Other Topics Concern  . 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    Family History: Family History  Problem Relation  Age of Onset  . Heart attack Mother   . Stroke Mother   . Diabetes Mother   . Cancer - Ovarian Mother   . Leukemia Mother   . Heart attack Father   . Stroke Father   . Diabetes Father   . Cancer - Other Father   . Hypertension Brother      Review of Systems: All other systems reviewed and are otherwise negative except as noted above.   Physical Exam: VS:  BP 122/78 mmHg  Pulse 79  Ht 6\' 1"  (1.854 m)  Wt 188 lb 12.8 oz (85.639 kg)  BMI 24.91 kg/m2  SpO2 96% , BMI Body mass index is 24.91 kg/(m^2).  GEN- The patient is well appearing, alert and oriented x 3 today.   HEENT:  normocephalic, atraumatic; sclera clear, conjunctiva pink; hearing intact; oropharynx clear; neck supple Lungs- Clear to ausculation bilaterally, normal work of breathing.  No wheezes, rales, rhonchi Heart- Regular rate and rhythm GI- soft, non-tender, non-distended, bowel sounds present Extremities- no clubbing, cyanosis, or edema; DP/PT/radial pulses 2+ bilaterally MS- no significant deformity or atrophy Skin- warm and dry, no rash or lesion Psych- euthymic mood, full affect Neuro- strength and sensation are intact  PPM Interrogation- reviewed in detail today,  See PACEART report  EKG:  EKG is not ordered today.  Recent Labs: No results found for requested labs within last 365 days.   Wt Readings from Last 3 Encounters:  03/01/15 188 lb 12.8 oz (85.639 kg)  08/24/14 189 lb 9.6 oz (86.002 kg)  04/06/14 189 lb 6.4 oz (85.911 kg)     Other studies Reviewed: Additional studies/ records that were reviewed today include: Marco Marco Morris office notes  Assessment and Plan:  1.  Tachy/brady Normal PPM function See Pace Art report No changes today  2.  Paroxysmal atrial fibrillation Significantly approved s/p PVI Intermittent palpitations that are very short in duration Will add back low dose Diltiazem today May need to consider monitoring if palpitations persist CHADS2VASC is 1 - continue ASA  3.  HTN Stable No change required today    Current medicines are reviewed at length with the patient today.   The patient does not have concerns regarding his medicines.  The following changes were made today:  Add Diltiazem 120mg  daily  Labs/ tests ordered today include: none   Disposition:   Follow up with Marco Morris in 6 months   Signed, Marco Marshall, NP 03/01/2015 7:17 PM  [place] [address] [place] [address] [phone] (office) [phone] (fax)

## 2015-03-19 ENCOUNTER — Telehealth: Payer: Self-pay | Admitting: *Deleted

## 2015-03-19 NOTE — Telephone Encounter (Signed)
I spoke to patient's wife to inform him of a potential battery malfuction due to dry battery cell. Patient was informed to call Dr.Allred's office if he has any symptoms of lightheadness, dizziness,or fainting or call 911 if emergency. Patient's wife verbalized understanding.

## 2015-05-13 ENCOUNTER — Telehealth: Payer: Self-pay | Admitting: Cardiology

## 2015-05-13 NOTE — Telephone Encounter (Signed)
Received records from Dr Jilda Panda for appointment on 06/04/15 with Dr Martinique.  Records given to [facility] (medical records) for Dr Doug Sou schedule on 06/04/15. lp

## 2015-06-04 ENCOUNTER — Ambulatory Visit: Payer: Managed Care, Other (non HMO) | Admitting: Cardiology

## 2015-06-25 ENCOUNTER — Encounter: Payer: Self-pay | Admitting: Internal Medicine

## 2015-08-05 ENCOUNTER — Ambulatory Visit (INDEPENDENT_AMBULATORY_CARE_PROVIDER_SITE_OTHER): Payer: Managed Care, Other (non HMO) | Admitting: Cardiology

## 2015-08-05 ENCOUNTER — Encounter: Payer: Self-pay | Admitting: Cardiology

## 2015-08-05 VITALS — BP 124/82 | HR 71 | Ht 73.0 in | Wt 188.0 lb

## 2015-08-05 DIAGNOSIS — I480 Paroxysmal atrial fibrillation: Secondary | ICD-10-CM | POA: Diagnosis not present

## 2015-08-05 DIAGNOSIS — I10 Essential (primary) hypertension: Secondary | ICD-10-CM

## 2015-08-05 DIAGNOSIS — E7800 Pure hypercholesterolemia, unspecified: Secondary | ICD-10-CM

## 2015-08-05 NOTE — Progress Notes (Signed)
Marco Morris Date of Birth: Apr 08, 1956   History of Present Illness: Marco Morris is seen for follow up atrial fibrillation dating back to 2003. This initially was managed with beta blocker therapy. Due to excessive fatigue he was switched to diltiazem. In 2015 he had increased Afib with post termination pauses. He had a leadless pacemaker inserted by Dr. Rayann Heman and was started on Tikosyn. He failed Tikosyn and had an Afib ablation in May 2015. Tikosyn was discontinued. More recently he notes infrequent shock sensation in his chest like "touching an electric fence". Really denies any chest pain or palpitations. Some SOB every once in a while. He is still working as a Administrator. He exercises regularly. He did have a normal ETT in November 2015. He is on ASA only with [place] vasc score of 1.   Current Outpatient Prescriptions on File Prior to Visit  Medication Sig Dispense Refill  . aspirin 81 MG tablet Take 81 mg by mouth daily.    Marland Kitchen atorvastatin (LIPITOR) 20 MG tablet Take 20 mg by mouth every evening.     . diltiazem (CARDIZEM CD) 120 MG 24 hr capsule Take 1 capsule (120 mg total) by mouth daily. 90 capsule 3  . levothyroxine (SYNTHROID, LEVOTHROID) 125 MCG tablet Take 125 mcg by mouth daily before breakfast.     . lisinopril (PRINIVIL,ZESTRIL) 10 MG tablet TAKE 1 TABLET (10 MG TOTAL) BY MOUTH DAILY. 30 tablet 0  . potassium chloride SA (K-DUR,KLOR-CON) 20 MEQ tablet Take 1 tablet (20 mEq total) by mouth daily. 30 tablet 3   No current facility-administered medications on file prior to visit.    Allergies  Allergen Reactions  . Codeine Nausea And Vomiting    Past Medical History  Diagnosis Date  . PAF (paroxysmal atrial fibrillation) (HCC)     a.  anticoagulated with Xarelto b. s/p Tikosyn loading 07/2013 c. s/p PVI  . Hypercholesterolemia   . History of renal calculi   . Hypothyroidism   . Blood dyscrasia     high risk for blood clot formation  . Neuromuscular disorder (HCC)      numbness left foot  . Tachycardia-bradycardia syndrome ([place])     a. post termination pauses b. s/p STJ leadless pacemaker  . Hypertension   . Chronic kidney disease     Past Surgical History  Procedure Laterality Date  . Cholecystectomy  ~ 2000  . Lithotripsy  2004; ~ 05/2013  . Tee without cardioversion [name] 10/08/2013    Procedure: TRANSESOPHAGEAL ECHOCARDIOGRAM (TEE);  Surgeon: Sanda Klein, MD;  Location: [facility] ENDOSCOPY;  Service: Cardiovascular;  Laterality: [name];  . Permanent pacemaker insertion [name] 07/18/2013    STJ Leadless pacemaker implanted by Dr Rayann Heman for symptomatic pauses  . Atrial fibrillation ablation [name] 10/09/2013    Procedure: ATRIAL FIBRILLATION ABLATION;  Surgeon: Coralyn Mark, MD;  Location: [place] CATH LAB;  Service: Cardiovascular;  Laterality: [name];    History  Smoking status  . Former Smoker -- 1.00 packs/day for 14 years  . Types: Cigarettes  . Quit date: 05/15/1985  Smokeless tobacco  . Former Systems developer  . Types: Chew  . Quit date: 01/13/2013    History  Alcohol Use  . 0.6 oz/week  . 1 Cans of beer per week    Family History  Problem Relation Age of Onset  . Heart attack Mother   . Stroke Mother   . Diabetes Mother   . Cancer - Ovarian Mother   . Leukemia Mother   .  Heart attack Father   . Stroke Father   . Diabetes Father   . Cancer - Other Father   . Hypertension Brother     Review of Systems: As noted in history of present illness.  All other systems were reviewed and are negative.  Physical Exam: BP 124/82 mmHg  Pulse 71  Ht 6\' 1"  (1.854 m)  Wt 85.276 kg (188 lb)  BMI 24.81 kg/m2 He is a well-developed white male in no acute distress. HEENT exam is unremarkable. He has no JVD or bruits. Lungs are clear. Cardiac exam reveals a RRR without gallop, murmur, or click. Abdomen is soft and nontender without mass or bruits. Extremities are without edema. Pulses are 2+ and symmetric throughout. Skin is warm and dry. Neurologic exam he is alert and  oriented x3. Cranial nerves II through XII are intact. He has no focal findings.  LABORATORY DATA: Lab Results  Component Value Date   WBC 13.6* 10/02/2013   HGB 14.6 10/02/2013   HCT 43.9 10/02/2013   PLT 229.0 10/02/2013   GLUCOSE 104* 10/17/2013   CHOL  12/23/2008    116        ATP III CLASSIFICATION:  <200     mg/dL   Desirable  200-239  mg/dL   Borderline High  >=240    mg/dL   High          TRIG 138 12/23/2008   HDL 35* 12/23/2008   LDLCALC  12/23/2008    53        Total Cholesterol/HDL:CHD Risk Coronary Heart Disease Risk Table                     Men   Women  1/2 Average Risk   3.4   3.3  Average Risk       5.0   4.4  2 X Average Risk   9.6   7.1  3 X Average Risk  23.4   11.0        Use the calculated Patient Ratio above and the CHD Risk Table to determine the patient's CHD Risk.        ATP III CLASSIFICATION (LDL):  <100     mg/dL   Optimal  100-129  mg/dL   Near or Above                    Optimal  130-159  mg/dL   Borderline  160-189  mg/dL   High  >190     mg/dL   Very High   ALT 33 03/08/2009   AST 23 03/08/2009   NA 137 10/17/2013   K 3.8 10/17/2013   CL 103 10/17/2013   CREATININE 1.2 10/17/2013   BUN 11 10/17/2013   CO2 26 10/17/2013   HGBA1C 5.8* 01/30/2013   Ecg: 08/05/15: NSR, normal. normal.Qtc 408 msec. I have personally reviewed and interpreted this study.    Assessment / Plan: 1. Paroxysmal atrial fibrillation. S/p leadless ventricular pacemaker for post termination pauses. Hasn't really used it since Afib ablation and good control of Afib. Recent electrical sensation does not sound cardiac to me. OK to stop diltiazem. Continue other meds. I will follow up in one year.   2. Hypercholesterolemia. Now on lipitor.

## 2015-08-05 NOTE — Patient Instructions (Signed)
You may stop diltiazem  Continue your other therapy  I will see you in one year

## 2015-09-02 ENCOUNTER — Encounter: Payer: Managed Care, Other (non HMO) | Admitting: Internal Medicine

## 2015-09-06 ENCOUNTER — Encounter: Payer: Self-pay | Admitting: Internal Medicine

## 2015-09-06 ENCOUNTER — Ambulatory Visit (INDEPENDENT_AMBULATORY_CARE_PROVIDER_SITE_OTHER): Payer: Managed Care, Other (non HMO) | Admitting: Internal Medicine

## 2015-09-06 VITALS — BP 118/76 | HR 68 | Ht 71.0 in | Wt 188.2 lb

## 2015-09-06 DIAGNOSIS — I480 Paroxysmal atrial fibrillation: Secondary | ICD-10-CM | POA: Diagnosis not present

## 2015-09-06 DIAGNOSIS — I10 Essential (primary) hypertension: Secondary | ICD-10-CM | POA: Diagnosis not present

## 2015-09-06 DIAGNOSIS — I495 Sick sinus syndrome: Secondary | ICD-10-CM

## 2015-09-06 LAB — CUP PACEART INCLINIC DEVICE CHECK: MDC IDC SESS DTM: 20170424171159

## 2015-09-06 NOTE — Patient Instructions (Addendum)
Medication Instructions:  Your physician has recommended you make the following change in your medication:  1) Stop Diltiazem     Labwork: None ordered   Testing/Procedures: None ordered   Follow-Up: Your physician wants you to follow-up in: 6 months with Chanetta Marshall, NP and research and 12 months with Dr Vallery Ridge will receive a reminder letter in the mail two months in advance. If you don't receive a letter, please call our office to schedule the follow-up appointment.   Any Other Special Instructions Will Be Listed Below (If Applicable).     If you need a refill on your cardiac medications before your next appointment, please call your pharmacy.

## 2015-09-06 NOTE — Progress Notes (Signed)
Electrophysiology Office Note   Date:  09/06/2015   ID:  Marco Morris, DOB 07-16-1955, MRN [id number]  PCP:  Jilda Panda, MD  Cardiologist:  Dr Martinique Primary Electrophysiologist: [name], MD    Chief Complaint  Patient presents with  . Atrial Fibrillation     History of Present Illness: Marco Morris is a 60 y.o. male who presents today for electrophysiology evaluation.   He is doing well without any symptoms of afib.  Denies any presyncope or syncope.  He has a rare "shocking" sensation in his chest lasting 1-2 seconds. Today, he denies symptoms of palpitations, chest pain, shortness of breath, orthopnea, PND, lower extremity edema, claudication,  bleeding, or neurologic sequela. The patient is tolerating medications without difficulties and is otherwise without complaint today.    Past Medical History  Diagnosis Date  . PAF (paroxysmal atrial fibrillation) (HCC)     a.  anticoagulated with Xarelto b. s/p Tikosyn loading 07/2013 c. s/p PVI  . Hypercholesterolemia   . History of renal calculi   . Hypothyroidism   . Blood dyscrasia     high risk for blood clot formation  . Neuromuscular disorder (HCC)     numbness left foot  . Tachycardia-bradycardia syndrome ([place])     a. post termination pauses b. s/p STJ leadless pacemaker  . Hypertension   . Chronic kidney disease    Past Surgical History  Procedure Laterality Date  . Cholecystectomy  ~ 2000  . Lithotripsy  2004; ~ 05/2013  . Tee without cardioversion [name] 10/08/2013    Procedure: TRANSESOPHAGEAL ECHOCARDIOGRAM (TEE);  Surgeon: Sanda Klein, MD;  Location: [facility] ENDOSCOPY;  Service: Cardiovascular;  Laterality: [name];  . Permanent pacemaker insertion [name] 07/18/2013    STJ Leadless pacemaker implanted by Dr Rayann Heman for symptomatic pauses  . Atrial fibrillation ablation [name] 10/09/2013    Procedure: ATRIAL FIBRILLATION ABLATION;  Surgeon: Coralyn Mark, MD;  Location: [place] CATH LAB;  Service: Cardiovascular;   Laterality: [name];     Current Outpatient Prescriptions  Medication Sig Dispense Refill  . aspirin 81 MG tablet Take 81 mg by mouth daily.    Marland Kitchen atorvastatin (LIPITOR) 20 MG tablet Take 20 mg by mouth every evening.     . diltiazem (CARDIZEM CD) 120 MG 24 hr capsule Take 1 capsule (120 mg total) by mouth daily. 90 capsule 3  . levothyroxine (SYNTHROID, LEVOTHROID) 125 MCG tablet Take 125 mcg by mouth daily before breakfast.     . lisinopril (PRINIVIL,ZESTRIL) 10 MG tablet TAKE 1 TABLET (10 MG TOTAL) BY MOUTH DAILY. 30 tablet 0  . potassium chloride SA (K-DUR,KLOR-CON) 20 MEQ tablet Take 1 tablet (20 mEq total) by mouth daily. 30 tablet 3   No current facility-administered medications for this visit.    Allergies:   Codeine   Social History:  The patient  reports that he quit smoking about 30 years ago. His smoking use included Cigarettes. He has a 14 pack-year smoking history. He quit smokeless tobacco use about 2 years ago. His smokeless tobacco use included Chew. He reports that he drinks about 0.6 oz of alcohol per week. He reports that he does not use illicit drugs.   Family History:  The patient's family history includes Cancer - Other in his father; Cancer - Ovarian in his mother; Diabetes in his father and mother; Heart attack in his father and mother; Hypertension in his brother; Leukemia in his mother; Stroke in his father and mother.    ROS:  Please  see the history of present illness.   All other systems are reviewed and negative.    PHYSICAL EXAM: VS:  BP 118/76 mmHg  Pulse 68  Ht 5\' 11"  (1.803 m)  Wt 188 lb 3.2 oz (85.367 kg)  BMI 26.26 kg/m2  SpO2 99% , BMI Body mass index is 26.26 kg/(m^2). GEN: Well nourished, well developed, in no acute distress HEENT: normal Neck: no JVD, carotid bruits, or masses Cardiac: RRR; no murmurs, rubs, or gallops,no edema  Respiratory:  clear to auscultation bilaterally, normal work of breathing GI: soft, nontender, nondistended, +  BS MS: no deformity or atrophy Skin: warm and dry  Neuro:  Strength and sensation are intact Psych: euthymic mood, full affect  EKG:  EKG is ordered today. The ekg ordered today shows sinus rhythm  Device interrogation is reviewed today in detail.  See PaceArt for details.   Recent Labs: No results found for requested labs within last 365 days.    Lipid Panel     Component Value Date/Time   CHOL  12/23/2008 0645    116        ATP III CLASSIFICATION:  <200     mg/dL   Desirable  200-239  mg/dL   Borderline High  >=240    mg/dL   High          TRIG 138 12/23/2008 0645   HDL 35* 12/23/2008 0645   CHOLHDL 3.3 12/23/2008 0645   VLDL 28 12/23/2008 0645   LDLCALC  12/23/2008 0645    53        Total Cholesterol/HDL:CHD Risk Coronary Heart Disease Risk Table                     Men   Women  1/2 Average Risk   3.4   3.3  Average Risk       5.0   4.4  2 X Average Risk   9.6   7.1  3 X Average Risk  23.4   11.0        Use the calculated Patient Ratio above and the CHD Risk Table to determine the patient's CHD Risk.        ATP III CLASSIFICATION (LDL):  <100     mg/dL   Optimal  100-129  mg/dL   Near or Above                    Optimal  130-159  mg/dL   Borderline  160-189  mg/dL   High  >190     mg/dL   Very High     Wt Readings from Last 3 Encounters:  09/06/15 188 lb 3.2 oz (85.367 kg)  08/05/15 188 lb (85.276 kg)  03/01/15 188 lb 12.8 oz (85.639 kg)    ASSESSMENT AND PLAN:  1.  Paroxysmal atrial fibrillation Resolved s/p ablation Stop diltiazem Has not required AAD therapy post ablation chads2vasc score is 1.  On asa  2. Post termination pauses Resolved s/p ablation Normal pacemaker function See Pace Art report No changes today We discussed Leadless pacemaker battery advisory today.  No indication for device replacement at this time.  3. HTN Will need to follow closely off of diltiazem May need to increase lisinopril  He is instructed to follow BP  at home   Current medicines are reviewed at length with the patient today.   The patient does not have concerns regarding his medicines.  The following changes were made today:  none   Follow-up: return to see Marco Morris in 6 months,  I will see in a year Dr Martinique to see as scheduled  Signed, [name], MD  09/06/2015 12:11 PM     [address] [place] [address] [phone] (office) [phone] (fax)

## 2015-09-22 ENCOUNTER — Encounter: Payer: Self-pay | Admitting: Internal Medicine

## 2015-11-29 ENCOUNTER — Encounter: Payer: Self-pay | Admitting: Nurse Practitioner

## 2015-12-31 ENCOUNTER — Encounter: Payer: Self-pay | Admitting: Nurse Practitioner

## 2016-01-26 ENCOUNTER — Encounter: Payer: Self-pay | Admitting: Internal Medicine

## 2016-02-07 NOTE — Progress Notes (Signed)
Electrophysiology Office Note Date: 02/08/2016  ID:  Marco Morris, DOB 04/01/1956, MRN [id number]  PCP: Jilda Panda, MD Primary Cardiologist: Martin[place] Electrophysiologist: Allred  CC: Pacemaker follow-up  Marco Morris is a 60 y.o. male seen today for Dr Rayann Heman.  He presents today for routine electrophysiology followup.  Since last being seen in our clinic, the patient reports doing relatively well. He has been having worsening shortness of breath with exertion associated with LE edema. He denies chest pain, dyspnea, PND, orthopnea, nausea, vomiting, dizziness, syncope. He has a few "skipped beats" 1-2 times per month that are self limiting and do not feel like previous episodes of AF.   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 Blood dyscrasia    high risk for blood clot formation  . Chronic kidney disease   . History of renal calculi   . Hypercholesterolemia   . Hypertension   . Hypothyroidism   . Neuromuscular disorder (HCC)    numbness left foot  . PAF (paroxysmal atrial fibrillation) (HCC)    a.  anticoagulated with Xarelto b. s/p Tikosyn loading 07/2013 c. s/p PVI  . Tachycardia-bradycardia syndrome ([place])    a. post termination pauses b. s/p STJ leadless pacemaker   Past Surgical History:  Procedure Laterality Date  . ATRIAL FIBRILLATION ABLATION [name] 10/09/2013   Procedure: ATRIAL FIBRILLATION ABLATION;  Surgeon: Coralyn Mark, MD;  Location: [place] CATH LAB;  Service: Cardiovascular;  Laterality: [name];  . CHOLECYSTECTOMY  ~ 2000  . LITHOTRIPSY  2004; ~ 05/2013  . PERMANENT PACEMAKER INSERTION [name] 07/18/2013   STJ Leadless pacemaker implanted by Dr Rayann Heman for symptomatic pauses  . TEE WITHOUT CARDIOVERSION [name] 10/08/2013   Procedure: TRANSESOPHAGEAL ECHOCARDIOGRAM (TEE);  Surgeon: Sanda Klein, MD;  Location: [facility] ENDOSCOPY;  Service: Cardiovascular;  Laterality: [name];    Current Outpatient  Prescriptions  Medication Sig Dispense Refill  . aspirin 81 MG tablet Take 81 mg by mouth daily.    Marland Kitchen atorvastatin (LIPITOR) 20 MG tablet Take 20 mg by mouth every evening.     Marland Kitchen levothyroxine (SYNTHROID, LEVOTHROID) 125 MCG tablet Take 125 mcg by mouth daily before breakfast.     . lisinopril (PRINIVIL,ZESTRIL) 10 MG tablet TAKE 1 TABLET (10 MG TOTAL) BY MOUTH DAILY. 30 tablet 0  . potassium chloride SA (K-DUR,KLOR-CON) 20 MEQ tablet Take 1 tablet (20 mEq total) by mouth daily. 30 tablet 3   No current facility-administered medications for this visit.     Allergies:   Codeine   Social History: Social History   Social History  . Marital status: Married    Spouse name: [name]  . Number of children: 2  . Years of education: [name]   Occupational History  . DRIVER [name]   Social History Main Topics  . Smoking status: Former Smoker    Packs/day: 1.00    Years: 14.00    Types: Cigarettes    Quit date: 05/15/1985  . Smokeless tobacco: Former Systems developer    Types: Chew    Quit date: 01/13/2013  . Alcohol use 0.6 oz/week    1 Cans of beer per week  . Drug use: No  . Sexual activity: Not Currently   Other Topics Concern  . 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    Family History: Family History  Problem Relation Age of Onset  . Heart  attack Mother   . Stroke Mother   . Diabetes Mother   . Cancer - Ovarian Mother   . Leukemia Mother   . Heart attack Father   . Stroke Father   . Diabetes Father   . Cancer - Other Father   . Hypertension Brother      Review of Systems: All other systems reviewed and are otherwise negative except as noted above.   Physical Exam: VS:  BP 132/76   Pulse 91   Ht 6\' 1"  (1.854 m)   Wt 190 lb 9.6 oz (86.5 kg)   SpO2 96%   BMI 25.15 kg/m  , BMI Body mass index is 25.15 kg/m.  GEN- The patient is well appearing, alert and oriented x 3 today.   HEENT: normocephalic, atraumatic; sclera  clear, conjunctiva pink; hearing intact; oropharynx clear; neck supple Lungs- Clear to ausculation bilaterally, normal work of breathing.  No wheezes, rales, rhonchi Heart- Regular rate and rhythm GI- soft, non-tender, non-distended, bowel sounds present Extremities- no clubbing, cyanosis, or edema; DP/PT/radial pulses 2+ bilaterally MS- no significant deformity or atrophy Skin- warm and dry, no rash or lesion Psych- euthymic mood, full affect Neuro- strength and sensation are intact  PPM Interrogation- reviewed in detail today,  See PACEART report  EKG:  EKG is not ordered today.  Recent Labs: No results found for requested labs within last 8760 hours.   Wt Readings from Last 3 Encounters:  02/08/16 190 lb 9.6 oz (86.5 kg)  09/06/15 188 lb 3.2 oz (85.4 kg)  08/05/15 188 lb (85.3 kg)     Other studies Reviewed: Additional studies/ records that were reviewed today include: Dr Jackalyn Lombard office notes  Assessment and Plan:  1.  Tachy/brady Normal PPM function See Pace Art report No changes today  2.  Paroxysmal atrial fibrillation Significantly improved s/p PVI CHADS2VASC is 1 - continue ASA  3.  HTN Stable No change required today  4. Shortness of breath Worsening recently also associated with LE edema Will update echo Recommended low Na++ diet   Current medicines are reviewed at length with the patient today.   The patient does not have concerns regarding his medicines.  The following changes were made today:  none  Labs/ tests ordered today include: none   Disposition:   Follow up with Dr Rayann Heman in 6 months   Signed, Chanetta Marshall, NP 02/08/2016 10:55 AM  [place] [address] [phone] (office) [phone] (fax)

## 2016-02-08 ENCOUNTER — Encounter: Payer: Self-pay | Admitting: Nurse Practitioner

## 2016-02-08 ENCOUNTER — Ambulatory Visit (INDEPENDENT_AMBULATORY_CARE_PROVIDER_SITE_OTHER): Payer: Managed Care, Other (non HMO) | Admitting: Nurse Practitioner

## 2016-02-08 VITALS — BP 132/76 | HR 91 | Ht 73.0 in | Wt 190.6 lb

## 2016-02-08 DIAGNOSIS — I495 Sick sinus syndrome: Secondary | ICD-10-CM | POA: Diagnosis not present

## 2016-02-08 DIAGNOSIS — I480 Paroxysmal atrial fibrillation: Secondary | ICD-10-CM | POA: Diagnosis not present

## 2016-02-08 DIAGNOSIS — R000 Tachycardia, unspecified: Secondary | ICD-10-CM | POA: Diagnosis not present

## 2016-02-08 DIAGNOSIS — R0602 Shortness of breath: Secondary | ICD-10-CM

## 2016-02-08 LAB — CUP PACEART INCLINIC DEVICE CHECK: Date Time Interrogation Session: 20170926110046

## 2016-02-08 NOTE — Patient Instructions (Addendum)
Medication Instructions:   Your physician recommends that you continue on your current medications as directed. Please refer to the Current Medication list given to you today.    If you need a refill on your cardiac medications before your next appointment, please call your pharmacy.  Labwork: Your physician recommends that you continue on your current medications as directed. Please refer to the Current Medication list given to you today.    Testing/Procedures:Your physician has requested that you have an echocardiogram. Echocardiography is a painless test that uses sound waves to create images of your heart. It provides your doctor with information about the size and shape of your heart and how well your heart's chambers and valves are working. This procedure takes approximately one hour. There are no restrictions for this procedure.     Follow-Up:  Your physician wants you to follow-up in:  [place] will receive a reminder letter in the mail two months in advance. If you don't receive a letter, please call our office to schedule the follow-up appointment.      Any Other Special Instructions Will Be Listed Below (If Applicable).   Low-Sodium Eating Plan Sodium raises blood pressure and causes water to be held in the body. Getting less sodium from food will help lower your blood pressure, reduce any swelling, and protect your heart, liver, and kidneys. We get sodium by adding salt (sodium chloride) to food. Most of our sodium comes from canned, boxed, and frozen foods. Restaurant foods, fast foods, and pizza are also very high in sodium. Even if you take medicine to lower your blood pressure or to reduce fluid in your body, getting less sodium from your food is important. WHAT IS MY PLAN? Most people should limit their sodium intake to 2,300 mg a day. Your health care provider recommends that you limit your sodium intake to __________ a day.  WHAT DO I NEED TO  KNOW ABOUT THIS EATING PLAN? For the low-sodium eating plan, you will follow these general guidelines:  Choose foods with a % Daily Value for sodium of less than 5% (as listed on the food label).   Use salt-free seasonings or herbs instead of table salt or sea salt.   Check with your health care provider or pharmacist before using salt substitutes.   Eat fresh foods.  Eat more vegetables and fruits.  Limit canned vegetables. If you do use them, rinse them well to decrease the sodium.   Limit cheese to 1 oz (28 g) per day.   Eat lower-sodium products, often labeled as "lower sodium" or "no salt added."  Avoid foods that contain monosodium glutamate (MSG). MSG is sometimes added to [place] food and some canned foods.  Check food labels (Nutrition Facts labels) on foods to learn how much sodium is in one serving.  Eat more home-cooked food and less restaurant, buffet, and fast food.  When eating at a restaurant, ask that your food be prepared with less salt, or no salt if possible.  HOW DO I READ FOOD LABELS FOR SODIUM INFORMATION? The Nutrition Facts label lists the amount of sodium in one serving of the food. If you eat more than one serving, you must multiply the listed amount of sodium by the number of servings. Food labels may also identify foods as:  Sodium free--Less than 5 mg in a serving.  Very low sodium--35 mg or less in a serving.  Low sodium--140 mg or less in  a serving.  Light in sodium--50% less sodium in a serving. For example, if a food that usually has 300 mg of sodium is changed to become light in sodium, it will have 150 mg of sodium.  Reduced sodium--25% less sodium in a serving. For example, if a food that usually has 400 mg of sodium is changed to reduced sodium, it will have 300 mg of sodium. WHAT FOODS CAN I EAT? Grains Low-sodium cereals, including oats, puffed wheat and rice, and shredded wheat cereals. Low-sodium crackers. Unsalted rice and  pasta. Lower-sodium bread.  Vegetables Frozen or fresh vegetables. Low-sodium or reduced-sodium canned vegetables. Low-sodium or reduced-sodium tomato sauce and paste. Low-sodium or reduced-sodium tomato and vegetable juices.  Fruits Fresh, frozen, and canned fruit. Fruit juice.  Meat and Other Protein Products Low-sodium canned tuna and salmon. Fresh or frozen meat, poultry, seafood, and fish. Lamb. Unsalted nuts. Dried beans, peas, and lentils without added salt. Unsalted canned beans. Homemade soups without salt. Eggs.  Dairy Milk. Soy milk. Ricotta cheese. Low-sodium or reduced-sodium cheeses. Yogurt.  Condiments Fresh and dried herbs and spices. Salt-free seasonings. Onion and garlic powders. Low-sodium varieties of mustard and ketchup. Fresh or refrigerated horseradish. Lemon juice.  Fats and Oils Reduced-sodium salad dressings. Unsalted butter.  Other Unsalted popcorn and pretzels.  The items listed above may not be a complete list of recommended foods or beverages. Contact your dietitian for more options. WHAT FOODS ARE NOT RECOMMENDED? Grains Instant hot cereals. Bread stuffing, pancake, and biscuit mixes. Croutons. Seasoned rice or pasta mixes. Noodle soup cups. Boxed or frozen macaroni and cheese. Self-rising flour. Regular salted crackers. Vegetables Regular canned vegetables. Regular canned tomato sauce and paste. Regular tomato and vegetable juices. Frozen vegetables in sauces. Salted [place] fries. Olives. Angie Fava. Relishes. Sauerkraut. Salsa. Meat and Other Protein Products Salted, canned, smoked, spiced, or pickled meats, seafood, or fish. Bacon, ham, sausage, hot dogs, corned beef, chipped beef, and packaged luncheon meats. Salt pork. Jerky. Pickled herring. Anchovies, regular canned tuna, and sardines. Salted nuts. Dairy Processed cheese and cheese spreads. Cheese curds. Blue cheese and cottage cheese. Buttermilk.  Condiments Onion and garlic salt, seasoned  salt, table salt, and sea salt. Canned and packaged gravies. Worcestershire sauce. Tartar sauce. Barbecue sauce. Teriyaki sauce. Soy sauce, including reduced sodium. Steak sauce. Fish sauce. Oyster sauce. Cocktail sauce. Horseradish that you find on the shelf. Regular ketchup and mustard. Meat flavorings and tenderizers. Bouillon cubes. Hot sauce. Tabasco sauce. Marinades. Taco seasonings. Relishes. Fats and Oils Regular salad dressings. Salted butter. Margarine. Ghee. Bacon fat.  Other Potato and tortilla chips. Corn chips and puffs. Salted popcorn and pretzels. Canned or dried soups. Pizza. Frozen entrees and pot pies.  The items listed above may not be a complete list of foods and beverages to avoid. Contact your dietitian for more information.   This information is not intended to replace advice given to you by your health care provider. Make sure you discuss any questions you have with your health care provider.   Document Released: 10/21/2001 Document Revised: 05/22/2014 Document Reviewed: 03/05/2013 Elsevier Interactive Patient Education [employer].

## 2016-02-09 ENCOUNTER — Encounter: Payer: Managed Care, Other (non HMO) | Admitting: Nurse Practitioner

## 2016-02-11 NOTE — Progress Notes (Signed)
This encounter was created in error - please disregard.

## 2016-02-28 ENCOUNTER — Ambulatory Visit (HOSPITAL_COMMUNITY): Payer: Managed Care, Other (non HMO) | Attending: Cardiology

## 2016-02-28 ENCOUNTER — Other Ambulatory Visit: Payer: Self-pay

## 2016-02-28 ENCOUNTER — Telehealth: Payer: Self-pay | Admitting: *Deleted

## 2016-02-28 DIAGNOSIS — R0602 Shortness of breath: Secondary | ICD-10-CM | POA: Diagnosis not present

## 2016-02-28 NOTE — Telephone Encounter (Signed)
SPOKE TO PT ABOUT RESULTS AND VERBALIZED UNDERSTANDING  

## 2016-02-28 NOTE — Telephone Encounter (Signed)
-----   Message from Patsey Berthold, NP sent at 02/28/2016  1:52 PM EDT ----- Please notify patient of echo results. Heart pumping function normal. No significant valvular abnormalities.

## 2016-03-06 ENCOUNTER — Encounter: Payer: Managed Care, Other (non HMO) | Admitting: Nurse Practitioner

## 2016-05-22 ENCOUNTER — Telehealth: Payer: Self-pay | Admitting: *Deleted

## 2016-05-22 NOTE — Telephone Encounter (Signed)
I spoke to patient's wife since patient was in [place]. I spoke to wife about clinical study update on detached docking button detachments for the Leadless pacemaker.I will send a written letter with the clinical update information. We will continue to follow patient every 6 months.Patient's wife verbalized understanding.

## 2016-07-17 ENCOUNTER — Encounter: Payer: Self-pay | Admitting: Internal Medicine

## 2016-07-17 ENCOUNTER — Ambulatory Visit (INDEPENDENT_AMBULATORY_CARE_PROVIDER_SITE_OTHER): Payer: Managed Care, Other (non HMO) | Admitting: Internal Medicine

## 2016-07-17 VITALS — BP 136/80 | HR 72 | Ht 73.0 in | Wt 190.0 lb

## 2016-07-17 DIAGNOSIS — I495 Sick sinus syndrome: Secondary | ICD-10-CM

## 2016-07-17 DIAGNOSIS — I480 Paroxysmal atrial fibrillation: Secondary | ICD-10-CM | POA: Diagnosis not present

## 2016-07-17 DIAGNOSIS — I10 Essential (primary) hypertension: Secondary | ICD-10-CM | POA: Diagnosis not present

## 2016-07-17 DIAGNOSIS — R0602 Shortness of breath: Secondary | ICD-10-CM | POA: Diagnosis not present

## 2016-07-17 LAB — CUP PACEART INCLINIC DEVICE CHECK
Date Time Interrogation Session: 20180305161433
Lead Channel Pacing Threshold Amplitude: 0.75 V
Lead Channel Pacing Threshold Pulse Width: 0.4 ms
Lead Channel Setting Pacing Amplitude: 2 V
Lead Channel Setting Pacing Pulse Width: 0.4 ms
Lead Channel Setting Sensing Sensitivity: 2 mV
MDC IDC MSMT LEADCHNL RV SENSING INTR AMPL: 12 mV
MDC IDC PG IMPLANT DT: 20150306

## 2016-07-17 NOTE — Patient Instructions (Addendum)
Medication Instructions:  Your physician recommends that you continue on your current medications as directed. Please refer to the Current Medication list given to you today.   Labwork: None ordered   Testing/Procedures: None ordered   Follow-Up: Your physician wants you to follow-up in: 6 months with Chanetta Marshall, NP and research You will receive a reminder letter in the mail two months in advance. If you don't receive a letter, please call our office to schedule the follow-up appointment.   Any Other Special Instructions Will Be Listed Below (If Applicable).     If you need a refill on your cardiac medications before your next appointment, please call your pharmacy.

## 2016-07-17 NOTE — Progress Notes (Signed)
Electrophysiology Office Note   Date:  07/17/2016   ID:  Marco Morris, DOB 1955/12/08, MRN [id number]  PCP:  Jilda Panda, MD  Cardiologist:  Dr Martinique Primary Electrophysiologist: [name], MD    Chief Complaint  Patient presents with  . Atrial Fibrillation     History of Present Illness: Marco Morris is a 61 y.o. male who presents today for electrophysiology evaluation.   He is doing well without any symptoms of afib.  Denies any presyncope or syncope.  He has been working over the past few months in [place].  He is very active.  He does notice occasional SOB at rest but denies exertional symptoms. Today, he denies symptoms of palpitations, chest pain, shortness of breath, orthopnea, PND, lower extremity edema, claudication,  bleeding, or neurologic sequela. The patient is tolerating medications without difficulties and is otherwise without complaint today.    Past Medical History:  Diagnosis Date  . Blood dyscrasia    high risk for blood clot formation  . Chronic kidney disease   . History of renal calculi   . Hypercholesterolemia   . Hypertension   . Hypothyroidism   . Neuromuscular disorder (HCC)    numbness left foot  . PAF (paroxysmal atrial fibrillation) (HCC)    a.  anticoagulated with Xarelto b. s/p Tikosyn loading 07/2013 c. s/p PVI  . Tachycardia-bradycardia syndrome ([place])    a. post termination pauses b. s/p STJ leadless pacemaker   Past Surgical History:  Procedure Laterality Date  . ATRIAL FIBRILLATION ABLATION [name] 10/09/2013   Procedure: ATRIAL FIBRILLATION ABLATION;  Surgeon: Coralyn Mark, MD;  Location: [facility] CATH LAB;  Service: Cardiovascular;  Laterality: [name];  . CHOLECYSTECTOMY  ~ 2000  . LITHOTRIPSY  2004; ~ 05/2013  . PERMANENT PACEMAKER INSERTION [name] 07/18/2013   STJ Leadless pacemaker implanted by Dr Rayann Heman for symptomatic pauses  . TEE WITHOUT CARDIOVERSION [name] 10/08/2013   Procedure: TRANSESOPHAGEAL ECHOCARDIOGRAM (TEE);  Surgeon:  Sanda Klein, MD;  Location: [facility] ENDOSCOPY;  Service: Cardiovascular;  Laterality: [name];     Current Outpatient Prescriptions  Medication Sig Dispense Refill  . aspirin 81 MG tablet Take 81 mg by mouth daily.    Marland Kitchen atorvastatin (LIPITOR) 20 MG tablet Take 20 mg by mouth every evening.     Marland Kitchen levothyroxine (SYNTHROID, LEVOTHROID) 125 MCG tablet Take 125 mcg by mouth daily before breakfast.     . lisinopril (PRINIVIL,ZESTRIL) 10 MG tablet TAKE 1 TABLET (10 MG TOTAL) BY MOUTH DAILY. 30 tablet 0  . potassium chloride SA (K-DUR,KLOR-CON) 20 MEQ tablet Take 1 tablet (20 mEq total) by mouth daily. 30 tablet 3   No current facility-administered medications for this visit.     Allergies:   Codeine   Social History:  The patient  reports that he quit smoking about 31 years ago. His smoking use included Cigarettes. He has a 14.00 pack-year smoking history. He quit smokeless tobacco use about 3 years ago. His smokeless tobacco use included Chew. He reports that he drinks about 0.6 oz of alcohol per week . He reports that he does not use drugs.   Family History:  The patient's family history includes Cancer - Other in his father; Cancer - Ovarian in his mother; Diabetes in his father and mother; Heart attack in his father and mother; Hypertension in his brother; Leukemia in his mother; Stroke in his father and mother.    ROS:  Please see the history of present illness.   All other systems  are reviewed and negative.    PHYSICAL EXAM: VS:  BP 136/80   Pulse 72   Ht 6\' 1"  (1.854 m)   Wt 190 lb (86.2 kg)   SpO2 98%   BMI 25.07 kg/m  , BMI Body mass index is 25.07 kg/m. GEN: Well nourished, well developed, in no acute distress  HEENT: normal  Neck: no JVD, carotid bruits, or masses Cardiac: RRR; no murmurs, rubs, or gallops,no edema  Respiratory:  clear to auscultation bilaterally, normal work of breathing GI: soft, nontender, nondistended, + BS MS: no deformity or atrophy  Skin: warm and dry    Neuro:  Strength and sensation are intact Psych: euthymic mood, full affect  EKG:  EKG is ordered today. The ekg ordered today shows sinus rhythm 72 bpm, otherwise normal ekg  Device interrogation is reviewed today in detail.  See PaceArt for details.    Lipid Panel     Component Value Date/Time   CHOL  12/23/2008 0645    116        ATP III CLASSIFICATION:  <200     mg/dL   Desirable  200-239  mg/dL   Borderline High  >=240    mg/dL   High          TRIG 138 12/23/2008 0645   HDL 35 (L) 12/23/2008 0645   CHOLHDL 3.3 12/23/2008 0645   VLDL 28 12/23/2008 0645   LDLCALC  12/23/2008 0645    53        Total Cholesterol/HDL:CHD Risk Coronary Heart Disease Risk Table                     Men   Women  1/2 Average Risk   3.4   3.3  Average Risk       5.0   4.4  2 X Average Risk   9.6   7.1  3 X Average Risk  23.4   11.0        Use the calculated Patient Ratio above and the CHD Risk Table to determine the patient's CHD Risk.        ATP III CLASSIFICATION (LDL):  <100     mg/dL   Optimal  100-129  mg/dL   Near or Above                    Optimal  130-159  mg/dL   Borderline  160-189  mg/dL   High  >190     mg/dL   Very High     Wt Readings from Last 3 Encounters:  07/17/16 190 lb (86.2 kg)  02/08/16 190 lb 9.6 oz (86.5 kg)  09/06/15 188 lb 3.2 oz (85.4 kg)    ASSESSMENT AND PLAN:  1.  Paroxysmal atrial fibrillation Resolved s/p ablation chads2vasc score is 1.  On asa  2. Post termination pauses Resolved s/p ablation Normal pacemaker function See Pace Art report No changes today We discussed Leadless pacemaker battery advisory today as well as the issue with device button at extraction.  No indication for device replacement at this time.  3. HTN Stable No change required today  4. SOB Mild I have reviewed echo from 2017 which reveals mildly elevated pulmonary pressures I have suggested PFTs, CXR and ETT which he declines at this time. If he has worsening  of his symptoms he may be more willing to consider. Could also consider repeat cardiac CT if above is unrevealing to evaluate for pulmonary vein stenosis.  Follow-up: return to see Chanetta Marshall every 6 months Dr Martinique to see as scheduled  Signed, [name], MD  07/17/2016 2:14 PM     [address] [place] [address] [phone] (office) [phone] (fax)

## 2017-01-03 ENCOUNTER — Encounter: Payer: Self-pay | Admitting: Nurse Practitioner

## 2017-01-23 NOTE — Progress Notes (Signed)
Electrophysiology Office Note Date: 01/24/2017  ID:  COSBY PROBY, DOB 11/05/1955, MRN [id number]  PCP: Jilda Panda, MD Primary Cardiologist: Martinique Electrophysiologist: Allred  CC: Pacemaker follow-up  Marco Morris is a 61 y.o. male seen today for Dr Rayann Heman.  He presents today for routine electrophysiology followup.  Since last being seen in our clinic, the patient reports doing relatively well. He has not had clinical recurrence of AF.  He continues with shortness of breath and is now willing to undergo further evaluation. He has also had worsening GERD that Zantac is no longer as effective for.  Symptoms are consistent with GERD and not angina. He denies chest pain, PND, orthopnea, nausea, vomiting, dizziness, syncope.   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 Blood dyscrasia    high risk for blood clot formation  . Chronic kidney disease   . History of renal calculi   . Hypercholesterolemia   . Hypertension   . Hypothyroidism   . Neuromuscular disorder (HCC)    numbness left foot  . PAF (paroxysmal atrial fibrillation) (HCC)    a.  anticoagulated with Xarelto b. s/p Tikosyn loading 07/2013 c. s/p PVI  . Tachycardia-bradycardia syndrome ([place])    a. post termination pauses b. s/p STJ leadless pacemaker   Past Surgical History:  Procedure Laterality Date  . ATRIAL FIBRILLATION ABLATION [name] 10/09/2013   Procedure: ATRIAL FIBRILLATION ABLATION;  Surgeon: Coralyn Mark, MD;  Location: [facility] CATH LAB;  Service: Cardiovascular;  Laterality: [name];  . CHOLECYSTECTOMY  ~ 2000  . LITHOTRIPSY  2004; ~ 05/2013  . PERMANENT PACEMAKER INSERTION [name] 07/18/2013   STJ Leadless pacemaker implanted by Dr Rayann Heman for symptomatic pauses  . TEE WITHOUT CARDIOVERSION [name] 10/08/2013   Procedure: TRANSESOPHAGEAL ECHOCARDIOGRAM (TEE);  Surgeon: Sanda Klein, MD;  Location: [facility] ENDOSCOPY;  Service: Cardiovascular;   Laterality: [name];    Current Outpatient Prescriptions  Medication Sig Dispense Refill  . aspirin 81 MG tablet Take 81 mg by mouth daily.    Marland Kitchen atorvastatin (LIPITOR) 20 MG tablet Take 20 mg by mouth every evening.     Marland Kitchen levothyroxine (SYNTHROID, LEVOTHROID) 125 MCG tablet Take 125 mcg by mouth daily before breakfast.     . lisinopril (PRINIVIL,ZESTRIL) 10 MG tablet TAKE 1 TABLET (10 MG TOTAL) BY MOUTH DAILY. 30 tablet 0  . potassium chloride SA (K-DUR,KLOR-CON) 20 MEQ tablet Take 1 tablet (20 mEq total) by mouth daily. 30 tablet 3  . pantoprazole (PROTONIX) 40 MG tablet Take 1 tablet (40 mg total) by mouth daily. 30 tablet 11   No current facility-administered medications for this visit.     Allergies:   Codeine   Social History: Social History   Social History  . Marital status: Married    Spouse name: [name]  . Number of children: 2  . Years of education: [name]   Occupational History  . DRIVER [name]   Social History Main Topics  . Smoking status: Former Smoker    Packs/day: 1.00    Years: 14.00    Types: Cigarettes    Quit date: 05/15/1985  . Smokeless tobacco: Former Systems developer    Types: Chew    Quit date: 01/13/2013  . Alcohol use 0.6 oz/week    1 Cans of beer per week  . Drug use: No  . Sexual activity: Not Currently   Other Topics Concern  . 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    Family History: Family History  Problem Relation Age of Onset  . Heart attack Mother   . Stroke Mother   . Diabetes Mother   . Cancer - Ovarian Mother   . Leukemia Mother   . Heart attack Father   . Stroke Father   . Diabetes Father   . Cancer - Other Father   . Hypertension Brother      Review of Systems: All other systems reviewed and are otherwise negative except as noted above.   Physical Exam: VS:  BP 110/80   Pulse 86   Ht 6\' 1"  (1.854 m)   Wt 195 lb 6.4 oz (88.6 kg)   SpO2 95%   BMI 25.78 kg/m  , BMI Body mass  index is 25.78 kg/m.  GEN- The patient is well appearing, alert and oriented x 3 today.   HEENT: normocephalic, atraumatic; sclera clear, conjunctiva pink; hearing intact; oropharynx clear; neck supple Lungs- Clear to ausculation bilaterally, normal work of breathing.  No wheezes, rales, rhonchi Heart- Regular rate and rhythm GI- soft, non-tender, non-distended, bowel sounds present Extremities- no clubbing, cyanosis, or edema; DP/PT/radial pulses 2+ bilaterally MS- no significant deformity or atrophy Skin- warm and dry, no rash or lesion Psych- euthymic mood, full affect Neuro- strength and sensation are intact  PPM Interrogation- reviewed in detail today,  See PACEART report  EKG:  EKG is not ordered today.  Recent Labs: No results found for requested labs within last 8760 hours.   Wt Readings from Last 3 Encounters:  01/24/17 195 lb 6.4 oz (88.6 kg)  07/17/16 190 lb (86.2 kg)  02/08/16 190 lb 9.6 oz (86.5 kg)     Other studies Reviewed: Additional studies/ records that were reviewed today include: Dr Jackalyn Lombard office notes  Assessment and Plan:  1.  Tachy/brady Normal PPM function See Claudia Desanctis Art report No changes today We discussed Leadless pacemaker battery advisory today as well as the issue with device button at extraction.  No indication for device replacement at this time.  2.  Paroxysmal atrial fibrillation Maintaining SR post ablation without AAD therapy CHADS2VASC is 1 - continue ASA  3.  HTN Stable No change required today  4. Shortness of breath Dr Rayann Heman recommended PFT's, CXR, GXT at last office visit which patient declined He is now willing to do CXR and PFT's.  Will order today If normal, will need to consider ischemic evaluation.  Echo last year without RWMA.  5.  GERD Change Zantac to Protonix Advised may need to establish with GI MD for further evaluation    Current medicines are reviewed at length with the patient today.   The patient does  not have concerns regarding his medicines.  The following changes were made today:  Stop Zantac, start Protonix 40mg  daily  Labs/ tests ordered today include: CXR, PFT's   Disposition:   Follow up with me every 6 months, Dr Martinique as scheduled   Signed, Chanetta Marshall, NP 01/24/2017 9:33 AM  [facility] [address] [place] [address] [phone] (office) [phone] (fax)

## 2017-01-24 ENCOUNTER — Ambulatory Visit
Admission: RE | Admit: 2017-01-24 | Discharge: 2017-01-24 | Disposition: A | Discharge status: Home / Self Care | Payer: Managed Care, Other (non HMO) | Source: Ambulatory Visit | Attending: Nurse Practitioner | Admitting: Nurse Practitioner

## 2017-01-24 ENCOUNTER — Ambulatory Visit (INDEPENDENT_AMBULATORY_CARE_PROVIDER_SITE_OTHER): Payer: Managed Care, Other (non HMO) | Admitting: Nurse Practitioner

## 2017-01-24 ENCOUNTER — Encounter: Payer: Self-pay | Admitting: Nurse Practitioner

## 2017-01-24 VITALS — BP 110/80 | HR 86 | Ht 73.0 in | Wt 195.4 lb

## 2017-01-24 DIAGNOSIS — I10 Essential (primary) hypertension: Secondary | ICD-10-CM | POA: Diagnosis not present

## 2017-01-24 DIAGNOSIS — I495 Sick sinus syndrome: Secondary | ICD-10-CM

## 2017-01-24 DIAGNOSIS — I480 Paroxysmal atrial fibrillation: Secondary | ICD-10-CM | POA: Diagnosis not present

## 2017-01-24 DIAGNOSIS — R0602 Shortness of breath: Secondary | ICD-10-CM | POA: Diagnosis not present

## 2017-01-24 MED ORDER — PANTOPRAZOLE SODIUM 40 MG PO TBEC: 40.0000 mg | DELAYED_RELEASE_TABLET | Freq: Every day | ORAL | 11 refills | Status: DC

## 2017-01-24 NOTE — Patient Instructions (Addendum)
Medication Instructions:  Your physician has recommended you make the following change in your medication:  1.  STOP the Zantac 2.  START Protonix 40 mg daily  Labwork: None ordered  Testing/Procedures: A chest x-ray takes a picture of the organs and structures inside the chest, including the heart, lungs, and blood vessels. This test can show several things, including, whether the heart is enlarges; whether fluid is building up in the lungs; and whether pacemaker / defibrillator leads are still in place.  Your physician has recommended that you have a pulmonary function test. Pulmonary Function Tests are a group of tests that measure how well air moves in and out of your lungs.    Follow-Up: Your physician wants you to follow-up in: [place], NP  You will receive a reminder letter in the mail two months in advance. If you don't receive a letter, please call our office to schedule the follow-up appointment.   Any Other Special Instructions Will Be Listed Below (If Applicable).  Chest X-Ray A chest X-ray is a painless test that uses radiation to create images of the structures inside of your chest. Chest X-rays are used to look for many health conditions, including heart failure, pneumonia, tuberculosis, rib fractures, breathing disorders, and cancer. They may be used to diagnose chest pain, constant coughing, or trouble breathing. Tell a health care provider about:  Any allergies you have.  All medicines you are taking, including vitamins, herbs, eye drops, creams, and over-the-counter medicines.  Any surgeries you have had.  Any medical conditions you have.  Whether you are pregnant or may be pregnant. What are the risks? Getting a chest X-ray is a safe procedure. However, you will be exposed to a small amount of radiation. Being exposed to too much radiation over a lifetime can increase the risk of cancer. This risk is small, but it may occur if you have many  X-rays throughout your life. What happens before the procedure?  You may be asked to remove glasses, jewelry, and any other metal objects.  You will be asked to undress from the waist up. You may be given a hospital gown to wear.  You may be asked to wear a protective lead apron to protect parts of your body from radiation. What happens during the procedure?  You will be asked to stand still as each picture is taken to get the best possible images.  You will be asked to take a deep breath and hold your breath for a few seconds.  The X-ray machine will create a picture of your chest using a tiny burst of radiation. This is painless.  More pictures may be taken from other angles. Typically, one picture will be taken while you face the X-ray camera, and another picture will be taken from the side while you stand. If you cannot stand, you may be asked to lie down. The procedure may vary among health care providers and hospitals. What happens after the procedure?  The X-ray(s) will be reviewed by your health care provider or an X-ray (radiology) specialist.  It is up to you to get your test results. Ask your health care provider, or the department that is doing the test, when your results will be ready.  Your health care provider will tell you if you need more tests or a follow-up exam. Keep all follow-up visits as told by your health care provider. This is important. Summary  A chest X-ray is a safe, painless test  that is used to examine the inside of the chest, heart, and lungs.  You will need to undress from the waist up and remove jewelry and metal objects before the procedure.  You will be exposed to a small amount of radiation during the procedure.  The X-ray machine will take one or more pictures of your chest while you remain as still as possible.  Later, a health care provider or specialist will review the test results with you. This information is not intended to replace  advice given to you by your health care provider. Make sure you discuss any questions you have with your health care provider. Document Released: 06/27/2016 Document Revised: 06/27/2016 Document Reviewed: 06/27/2016 Elsevier Interactive Patient Education  [employer].   If you need a refill on your cardiac medications before your next appointment, please call your pharmacy.

## 2017-01-25 ENCOUNTER — Telehealth: Payer: Self-pay | Admitting: Internal Medicine

## 2017-01-25 ENCOUNTER — Telehealth: Payer: Self-pay | Admitting: Nurse Practitioner

## 2017-01-25 NOTE — Telephone Encounter (Signed)
°  New Message   pt wife verbalized that she is returning call for rn   Leave vm of the results

## 2017-01-25 NOTE — Telephone Encounter (Signed)
Called pt and left message for pt to call back to get his CXR results.

## 2017-01-25 NOTE — Telephone Encounter (Signed)
Called pt's wife back to inform her of pt's CXR results. Wife verbalized understanding.

## 2017-01-25 NOTE — Telephone Encounter (Signed)
-----   Message from Patsey Berthold, NP sent at 01/24/2017  5:41 PM EDT ----- Please notify patient of stable CXR.  Will get results of PFT's and evaluate need for further testing once they are reviewed. Thanks!

## 2017-02-05 ENCOUNTER — Ambulatory Visit (INDEPENDENT_AMBULATORY_CARE_PROVIDER_SITE_OTHER): Payer: Managed Care, Other (non HMO) | Admitting: Internal Medicine

## 2017-02-05 DIAGNOSIS — R0602 Shortness of breath: Secondary | ICD-10-CM | POA: Diagnosis not present

## 2017-02-05 LAB — PULMONARY FUNCTION TEST
DL/VA % pred: 84 %
DL/VA: 3.98 ml/min/mmHg/L
DLCO UNC % PRED: 80 %
DLCO UNC: 28.16 ml/min/mmHg
DLCO cor % pred: 79 %
DLCO cor: 28.01 ml/min/mmHg
FEF 25-75 PRE: 2.7 L/s
FEF 25-75 Post: 4.49 L/sec
FEF2575-%Change-Post: 66 %
FEF2575-%PRED-POST: 150 %
FEF2575-%PRED-PRE: 90 %
FEV1-%Change-Post: 18 %
FEV1-%PRED-POST: 94 %
FEV1-%PRED-PRE: 79 %
FEV1-Post: 3.54 L
FEV1-Pre: 2.99 L
FEV1FVC-%CHANGE-POST: 2 %
FEV1FVC-%PRED-PRE: 103 %
FEV6-%Change-Post: 15 %
FEV6-%Pred-Post: 93 %
FEV6-%Pred-Pre: 80 %
FEV6-Post: 4.44 L
FEV6-Pre: 3.84 L
FEV6FVC-%Change-Post: 0 %
FEV6FVC-%Pred-Post: 104 %
FEV6FVC-%Pred-Pre: 104 %
FVC-%Change-Post: 15 %
FVC-%Pred-Post: 89 %
FVC-%Pred-Pre: 77 %
FVC-Post: 4.46 L
FVC-Pre: 3.85 L
POST FEV1/FVC RATIO: 79 %
Post FEV6/FVC ratio: 100 %
Pre FEV1/FVC ratio: 78 %
Pre FEV6/FVC Ratio: 100 %
RV % pred: 132 %
RV: 3.21 L
TLC % pred: 104 %
TLC: 7.77 L

## 2017-02-05 NOTE — Progress Notes (Signed)
PFT done today. Marco Morris,CMA  

## 2017-02-07 ENCOUNTER — Telehealth: Payer: Self-pay | Admitting: Internal Medicine

## 2017-02-07 NOTE — Telephone Encounter (Signed)
Patient is calling to get CXR results.  Please call

## 2017-02-07 NOTE — Telephone Encounter (Signed)
New message       Returning a call to a nurse to get test results

## 2017-02-12 ENCOUNTER — Other Ambulatory Visit: Payer: Self-pay | Admitting: *Deleted

## 2017-02-12 DIAGNOSIS — R0602 Shortness of breath: Secondary | ICD-10-CM

## 2017-02-28 ENCOUNTER — Other Ambulatory Visit (INDEPENDENT_AMBULATORY_CARE_PROVIDER_SITE_OTHER): Payer: Managed Care, Other (non HMO)

## 2017-02-28 ENCOUNTER — Ambulatory Visit (INDEPENDENT_AMBULATORY_CARE_PROVIDER_SITE_OTHER): Payer: Managed Care, Other (non HMO) | Admitting: Pulmonary Disease

## 2017-02-28 ENCOUNTER — Encounter: Payer: Self-pay | Admitting: Pulmonary Disease

## 2017-02-28 VITALS — BP 142/92 | HR 82 | Ht 71.0 in | Wt 194.2 lb

## 2017-02-28 DIAGNOSIS — J439 Emphysema, unspecified: Secondary | ICD-10-CM

## 2017-02-28 LAB — CBC WITH DIFFERENTIAL/PLATELET
BASOS PCT: 0.6 % (ref 0.0–3.0)
Basophils Absolute: 0.1 10*3/uL (ref 0.0–0.1)
EOS PCT: 10.6 % — AB (ref 0.0–5.0)
Eosinophils Absolute: 0.9 10*3/uL — ABNORMAL HIGH (ref 0.0–0.7)
HEMATOCRIT: 45.7 % (ref 39.0–52.0)
HEMOGLOBIN: 15.3 g/dL (ref 13.0–17.0)
LYMPHS PCT: 30.1 % (ref 12.0–46.0)
Lymphs Abs: 2.5 10*3/uL (ref 0.7–4.0)
MCHC: 33.4 g/dL (ref 30.0–36.0)
MCV: 88.5 fl (ref 78.0–100.0)
MONO ABS: 0.7 10*3/uL (ref 0.1–1.0)
MONOS PCT: 8.6 % (ref 3.0–12.0)
Neutro Abs: 4.1 10*3/uL (ref 1.4–7.7)
Neutrophils Relative %: 50.1 % (ref 43.0–77.0)
Platelets: 194 10*3/uL (ref 150.0–400.0)
RBC: 5.16 Mil/uL (ref 4.22–5.81)
RDW: 14 % (ref 11.5–15.5)
WBC: 8.2 10*3/uL (ref 4.0–10.5)

## 2017-02-28 LAB — NITRIC OXIDE: NITRIC OXIDE: 23

## 2017-02-28 MED ORDER — ALBUTEROL SULFATE HFA 108 (90 BASE) MCG/ACT IN AERS: 2.0000 | INHALATION_SPRAY | Freq: Four times a day (QID) | RESPIRATORY_TRACT | 6 refills | Status: DC | PRN

## 2017-02-28 MED ORDER — BUDESONIDE-FORMOTEROL FUMARATE 160-4.5 MCG/ACT IN AERO: 2.0000 | INHALATION_SPRAY | Freq: Two times a day (BID) | RESPIRATORY_TRACT | 0 refills | Status: DC

## 2017-02-28 MED ORDER — BUDESONIDE-FORMOTEROL FUMARATE 160-4.5 MCG/ACT IN AERO: 2.0000 | INHALATION_SPRAY | Freq: Two times a day (BID) | RESPIRATORY_TRACT | 4 refills | Status: DC

## 2017-02-28 NOTE — Patient Instructions (Addendum)
We will start Symbicort 160/4.5. Use 2 puffs twice daily We started on an albuterol rescue inhaler. Uses up to 4 times a day as needed for shortness of breath and also before you exercise Check alpha-1 antitrypsin levels and phenotype, CBC with diff and blood allergy profile Follow-up in 3 months. Please call us if there is any worsening of your symptoms

## 2017-02-28 NOTE — Progress Notes (Addendum)
Marco Morris    [id number]    12/02/1955  Primary Care Physician:Morris, Marco Manner, MD  Referring Physician: Jilda Panda, MD [id number] [place], [place] [address]  Chief complaint:  Consult for evaluation of dyspnea  HPI: 61 year old with history of atrial fibrillation, hyperlipidemia, hypothyroidism, hypertension. He has complaints of dyspnea with activity and rest for the past several years which is getting worse. He has non-productive cough with no sputum production. Denies any wheeze, fevers, chills. He has history of atrial fibrillation status post ablation in 2015 and has been stable since then. He has history of GERD. Symptoms are well controlled on Zantac. There are no seasonal allergies, postnasal drip. He is physically active and does regular exercise with running and swimming.  Pets:None Occupation: Administrator Exposures: None. No mold at home Smoking history:15-pack-year smoking history. Quit in 1979 Travel History: No recent travel except for work   Outpatient Encounter Prescriptions as of 02/28/2017  Medication Sig  . aspirin 81 MG tablet Take 81 mg by mouth daily.  Marland Kitchen atorvastatin (LIPITOR) 20 MG tablet Take 20 mg by mouth every evening.   Marland Kitchen levothyroxine (SYNTHROID, LEVOTHROID) 125 MCG tablet Take 125 mcg by mouth daily before breakfast.   . lisinopril (PRINIVIL,ZESTRIL) 10 MG tablet TAKE 1 TABLET (10 MG TOTAL) BY MOUTH DAILY.  . Multiple Vitamins-Minerals (CENTRUM SILVER 50+MEN) TABS Take 1 tablet by mouth daily.  . potassium chloride SA (K-DUR,KLOR-CON) 20 MEQ tablet Take 1 tablet (20 mEq total) by mouth daily.  . ranitidine (ZANTAC) 150 MG tablet Take 150 mg by mouth daily.  . [DISCONTINUED] pantoprazole (PROTONIX) 40 MG tablet Take 1 tablet (40 mg total) by mouth daily.   No facility-administered encounter medications on file as of 02/28/2017.     Allergies as of 02/28/2017 - Review Complete 02/28/2017  Allergen Reaction Noted  . Codeine  Nausea And Vomiting 10/27/2010    Past Medical History:  Diagnosis Date  . Blood dyscrasia    high risk for blood clot formation  . Chronic kidney disease   . History of renal calculi   . Hypercholesterolemia   . Hypertension   . Hypothyroidism   . Neuromuscular disorder (HCC)    numbness left foot  . PAF (paroxysmal atrial fibrillation) (HCC)    a.  anticoagulated with Xarelto b. s/p Tikosyn loading 07/2013 c. s/p PVI  . Tachycardia-bradycardia syndrome ([place])    a. post termination pauses b. s/p STJ leadless pacemaker    Past Surgical History:  Procedure Laterality Date  . ATRIAL FIBRILLATION ABLATION [name] 10/09/2013   Procedure: ATRIAL FIBRILLATION ABLATION;  Surgeon: Marco Mark, MD;  Location: [facility] CATH LAB;  Service: Cardiovascular;  Laterality: [name];  . CHOLECYSTECTOMY  ~ 2000  . LITHOTRIPSY  2004; ~ 05/2013  . PERMANENT PACEMAKER INSERTION [name] 07/18/2013   STJ Leadless pacemaker implanted by Dr Marco Morris for symptomatic pauses  . TEE WITHOUT CARDIOVERSION [name] 10/08/2013   Procedure: TRANSESOPHAGEAL ECHOCARDIOGRAM (TEE);  Surgeon: Marco Klein, MD;  Location: [facility] ENDOSCOPY;  Service: Cardiovascular;  Laterality: [name];    Family History  Problem Relation Age of Onset  . Heart attack Mother   . Stroke Mother   . Diabetes Mother   . Cancer - Ovarian Mother   . Leukemia Mother   . Heart attack Father   . Stroke Father   . Diabetes Father   . Cancer - Other Father   . Hypertension Brother     Social History  Social History  . Marital status: Married    Spouse name: [name]  . Number of children: 2  . Years of education: [name]   Occupational History  . DRIVER Marco Morris   Social History Main Topics  . Smoking status: Former Smoker    Packs/day: 1.00    Years: 14.00    Types: Cigarettes    Quit date: 05/15/1977  . Smokeless tobacco: Former Systems developer    Types: Chew    Quit date: 01/13/2013  . Alcohol use 0.6 oz/week    1 Cans of beer per week  . Drug use: No  .  Sexual activity: Not Currently   Other Topics Concern  . 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    Review of systems: Review of Systems  Constitutional: Negative for fever and chills.  HENT: Negative.   Eyes: Negative for blurred vision.  Respiratory: as per HPI  Cardiovascular: Negative for chest pain and palpitations.  Gastrointestinal: Negative for vomiting, diarrhea, blood per rectum. Genitourinary: Negative for dysuria, urgency, frequency and hematuria.  Musculoskeletal: Negative for myalgias, back pain and joint pain.  Skin: Negative for itching and rash.  Neurological: Negative for dizziness, tremors, focal weakness, seizures and loss of consciousness.  Endo/Heme/Allergies: Negative for environmental allergies.  Psychiatric/Behavioral: Negative for depression, suicidal ideas and hallucinations.  All other systems reviewed and are negative.  Physical Exam: Blood pressure (!) 142/92, pulse 82, height 5\' 11"  (1.803 m), weight 194 lb 3.2 oz (88.1 kg), SpO2 96 %. Gen:      No acute distress HEENT:  EOMI, sclera anicteric Neck:     No masses; no thyromegaly Lungs:    Clear to auscultation bilaterally; normal respiratory effort CV:         Regular rate and rhythm; no murmurs Abd:      + bowel sounds; soft, non-tender; no palpable masses, no distension Ext:    No edema; adequate peripheral perfusion Skin:      Warm and dry; no rash Neuro: alert and oriented x 3 Psych: normal mood and affect  Data Reviewed: FENO 02/28/17- 23  PFTs 02/05/17  FVC 4.46 (89%], FEV1 3.54 (94%), F/F 79, TLC 104%, DLCO 80% Minimal obstruction and diffusion defect with significant bronchodilator response. Mild air trapping.  CT chest 04/26/01-no lung mass, infiltrate, adenopathy. Early emphysematous changes. CT abdomen pelvis 03/03/13-lung images are clear. Chest x-ray 01/24/17-mild hyperinflation. I reviewed all images personally  Assessment:  COPD  GOLD B (CAT score 18, no exacerbations) Mild asthma Mr. Marco Morris has COPD based on his PFTs and lung imaging which shows mild emphysematous changes stating back to 2002. PFTs show significant bronchodilator response and he'll benefit from initiation of LABA/inhaled corticosteroid.  Labs noted for mild elevation in peripheral eos in 2015 and 2012. We will recheck CBC with diff and blood allergy profile. He does not have significant smoking history but still has emphysema- Check alpha-1 antitrypsin levels and phenotype  Plan/Recommendations: - Start Symbicort 160/4.5, albuterol rescue inhaler - Check alpha-1 antitrypsin levels and phenotype - CBC with diff, blood allergy profile  Marshell Garfinkel MD [facility] Pager [phone] 02/28/2017, 9:43 AM  CC: Marco Panda, MD

## 2017-03-03 LAB — ALPHA-1 ANTITRYPSIN PHENOTYPE: A-1 Antitrypsin, Ser: 136 mg/dL (ref 83–199)

## 2017-03-07 ENCOUNTER — Telehealth: Payer: Self-pay | Admitting: *Deleted

## 2017-03-07 ENCOUNTER — Telehealth: Payer: Self-pay | Admitting: Pulmonary Disease

## 2017-03-07 ENCOUNTER — Telehealth: Payer: Self-pay | Admitting: Nurse Practitioner

## 2017-03-07 NOTE — Telephone Encounter (Signed)
LMOVM TO CONTACT CLINIC BACK TO SPECIFY WHICH MEDICATION CAN NOT AFFORD

## 2017-03-07 NOTE — Telephone Encounter (Signed)
SPOKE TO PT AND PT STATES THAT INSURANCE WILL NOT COVER THE PROTONIX AND HE HAS STARTED BACK TAKING THE ZANTAC EVEN [place] ON OV 01-24-17 IT SAYS TO STOP.  PT WANTS TO KNOW WHAT IS AN ALTERNATIVE TO TAKE THAT'S  AFFORDABLE.

## 2017-03-07 NOTE — Telephone Encounter (Signed)
  Notes recorded by Marshell Garfinkel, MD on 03/04/2017 at 8:13 PM EDT Please let the patient know that the labs we did for Alpha1 antitrypsin levels were normal His blood counts show increase in inflammation consistent with asthma. Recommend continuing the inhalers as prescribed. Will reassess at time of clinic visit. ------------- Spoke with pt's spouse (dpr on file), re-reviewed results/recs.  Nothing further needed.

## 2017-03-07 NOTE — Telephone Encounter (Signed)
New Message  Pt c/o medication issue:  1. Name of Medication: per pt does not know the name of the medication prescribed  2. How are you currently taking this medication (dosage and times per day)? NA  3. Are you having a reaction (difficulty breathing--STAT)? No   4. What is your medication issue? Per pt states his insurance denied the medication. Pt would like to know if there is an alternate. Please call back to discuss

## 2017-03-07 NOTE — Telephone Encounter (Signed)
Follow up     Pt is returning call asking for a call back.

## 2017-03-08 NOTE — Telephone Encounter (Signed)
Please check to see if insurance will cover Prilosec 20mg  daily  Chanetta Marshall, NP 03/08/2017 5:57 AM

## 2017-03-09 ENCOUNTER — Telehealth: Payer: Self-pay | Admitting: *Deleted

## 2017-03-09 MED ORDER — OMEPRAZOLE 20 MG PO CPDR
20.0000 mg | DELAYED_RELEASE_CAPSULE | Freq: Every day | ORAL | 1 refills | Status: DC
Start: 2017-03-09 — End: 2017-04-03

## 2017-03-09 NOTE — Telephone Encounter (Signed)
SPOKE TO PT WIFE ABOUT TRYING PRILOSEC 20 MG DAILY TO SEE IF COVERED THROUGH INSURANCE.  PT WIFE WAS TOLD TO TELL PT TO CONTACT OFFICE BACK IF THIS IS NOT COVERED.

## 2017-04-03 ENCOUNTER — Other Ambulatory Visit: Payer: Self-pay | Admitting: Nurse Practitioner

## 2017-04-03 MED ORDER — OMEPRAZOLE 20 MG PO CPDR: 20.0000 mg | DELAYED_RELEASE_CAPSULE | Freq: Every day | ORAL | 3 refills | Status: DC

## 2017-04-09 ENCOUNTER — Telehealth: Payer: Self-pay | Admitting: Nurse Practitioner

## 2017-04-09 NOTE — Telephone Encounter (Signed)
**Note De-Identified Fredy Gladu Obfuscation** I called the pts pharmacy, [facility], and was advised that PA is required on the pts Omeprazole and that they sent [place] a fax concerning the PA. We have not received that fax yet.  I called the pt back and made him aware that I will do the PA and that as soon as I get a response I will notify his pharmacy. He verbalized understanding.

## 2017-04-09 NOTE — Telephone Encounter (Signed)
New message     Pt c/o medication issue:  1. Name of Medication:  omeprazole (PRILOSEC) 20 MG capsule Take 1 capsule (20 mg total) by mouth daily.     2. How are you currently taking this medication (dosage and times per day)? 1 tablet a day  3. Are you having a reaction (difficulty breathing--STAT)?  no  4. What is your medication issue? The insurance company will not refill medication , either needs something else or to have you call insurance company and pre authorize

## 2017-04-09 NOTE — Telephone Encounter (Signed)
**Note De-Identified Nicci Vaughan Obfuscation** I have done a Omeprazole PA through covermymeds.

## 2017-04-10 NOTE — Telephone Encounter (Signed)
**Note De-Identified Kadeshia Kasparian Obfuscation** Denial on Omeprazole PA received Nezzie Manera fax from [place]. Reason:  Per the pts benefit plan Omeprazole is excluded from coverage and simply does not cover this medication.  Will forward to Chanetta Marshall, NP and her nurse Edwena Blow.

## 2017-05-31 ENCOUNTER — Ambulatory Visit: Payer: Managed Care, Other (non HMO) | Admitting: Pulmonary Disease

## 2017-06-01 ENCOUNTER — Other Ambulatory Visit: Payer: Self-pay | Admitting: *Deleted

## 2017-06-01 MED ORDER — BUDESONIDE-FORMOTEROL FUMARATE 160-4.5 MCG/ACT IN AERO: 2.0000 | INHALATION_SPRAY | Freq: Two times a day (BID) | RESPIRATORY_TRACT | 4 refills | Status: DC

## 2017-06-01 NOTE — Telephone Encounter (Signed)
Received refill request for symbicort 160 from [facility]. Per last OV note patient was to continue Symbicort 160. RX sent to preferred pharmacy.

## 2017-06-04 ENCOUNTER — Ambulatory Visit: Payer: Managed Care, Other (non HMO) | Admitting: Acute Care

## 2017-06-04 ENCOUNTER — Encounter: Payer: Self-pay | Admitting: Acute Care

## 2017-06-04 VITALS — BP 160/90 | HR 75 | Ht 71.0 in | Wt 196.8 lb

## 2017-06-04 DIAGNOSIS — R059 Cough, unspecified: Secondary | ICD-10-CM

## 2017-06-04 DIAGNOSIS — R05 Cough: Secondary | ICD-10-CM | POA: Diagnosis not present

## 2017-06-04 DIAGNOSIS — J449 Chronic obstructive pulmonary disease, unspecified: Secondary | ICD-10-CM | POA: Insufficient documentation

## 2017-06-04 LAB — NITRIC OXIDE: Nitric Oxide: 38

## 2017-06-04 MED ORDER — FLUTICASONE-UMECLIDIN-VILANT 100-62.5-25 MCG/INH IN AEPB: 1.0000 | INHALATION_SPRAY | Freq: Every day | RESPIRATORY_TRACT | 0 refills | Status: DC

## 2017-06-04 NOTE — Progress Notes (Signed)
History of Present Illness Marco Morris is a 62 y.o. male former smoker ( Quit 1979, 14 pack year smoking history) with COPD Gold B, a fib and HTN seen by Dr. Vaughan Browner for Dyspnea.  HPI: 62 year old with history of atrial fibrillation, hyperlipidemia, hypothyroidism, hypertension. He has complaints of dyspnea with activity and rest for the past several years which is getting worse. He has non-productive cough with no sputum production. Denies any wheeze, fevers, chills. He has history of atrial fibrillation status post ablation in 2015 and has been stable since then. He has history of GERD. Symptoms are well controlled on Zantac. There are no seasonal allergies, postnasal drip. He is physically active and does regular exercise with running and swimming.   06/04/2017  Pt. Was seen for consultation by Dr. Vaughan Browner 02/2017. Diagnosis and plan after that visit were:  COPD GOLD B (CAT score 18, no exacerbations) Mild asthma [name] has COPD based on his PFTs and lung imaging which shows mild emphysematous changes stating back to 2002. PFTs show significant bronchodilator response and he'll benefit from initiation of LABA/inhaled corticosteroid.  Labs noted for mild elevation in peripheral eos in 2015 and 2012. We will recheck CBC with diff and blood allergy profile. He does not have significant smoking history but still has emphysema- Check alpha-1 antitrypsin levels and phenotype  Plan/Recommendations: - Start Symbicort 160/4.5, albuterol rescue inhaler - Check alpha-1 antitrypsin levels and phenotype - CBC with diff, blood allergy profile  He presents today for 3 month follow up. He states he is having continued shortness of breath despite being compliant with his Symbicort.He states he is having to use his rescue inhaler about 2 times daily, but feels he could use it more as his  Dyspnea is often. He is running 3 miles on a treadmill 5 times a week and he swims. He states he has worse  dyspnea with swimming. He denies fever, chest pain, orthopnea or hemoptysis.He is compliant with his Protonix.daily.He does have an occasional cough, but states he does not feel it is allergy related.  Test Results: FENO:>> 05/2017>> 38 ppb Alpha 1>> 02/2017>> 136 PI*MM   CBC Latest Ref Rng & Units 02/28/2017 10/02/2013 07/16/2013  WBC 4.0 - 10.5 K/uL 8.2 13.6(H) 7.3  Hemoglobin 13.0 - 17.0 g/dL 15.3 14.6 14.1  Hematocrit 39.0 - 52.0 % 45.7 43.9 43.0  Platelets 150.0 - 400.0 K/uL 194.0 229.0 175.0    BMP Latest Ref Rng & Units 10/17/2013 10/10/2013 10/02/2013  Glucose 70 - 99 mg/dL 104(H) 112(H) 92  BUN 6 - 23 mg/dL 11 15 15   Creatinine 0.4 - 1.5 mg/dL 1.2 1.29 1.5  Sodium 135 - 145 mEq/L 137 139 138  Potassium 3.5 - 5.1 mEq/L 3.8 4.4 3.9  Chloride 96 - 112 mEq/L 103 101 99  CO2 19 - 32 mEq/L 26 27 30   Calcium 8.4 - 10.5 mg/dL 9.6 9.4 9.6    ProBNP    Component Value Date/Time   PROBNP 56.2 06/05/2013 0850    PFT    Component Value Date/Time   FEV1PRE 2.99 02/05/2017 0857   FEV1POST 3.54 02/05/2017 0857   FVCPRE 3.85 02/05/2017 0857   FVCPOST 4.46 02/05/2017 0857   TLC 7.77 02/05/2017 0857   DLCOUNC 28.16 02/05/2017 0857   PREFEV1FVCRT 78 02/05/2017 0857   PSTFEV1FVCRT 79 02/05/2017 0857    No results found.   Past medical hx Past Medical History:  Diagnosis Date  . Blood dyscrasia    high risk for blood  clot formation  . Chronic kidney disease   . History of renal calculi   . Hypercholesterolemia   . Hypertension   . Hypothyroidism   . Neuromuscular disorder (HCC)    numbness left foot  . PAF (paroxysmal atrial fibrillation) (HCC)    a.  anticoagulated with Xarelto b. s/p Tikosyn loading 07/2013 c. s/p PVI  . Tachycardia-bradycardia syndrome ([place])    a. post termination pauses b. s/p STJ leadless pacemaker     Social History   Tobacco Use  . Smoking status: Former Smoker    Packs/day: 1.00    Years: 14.00    Pack years: 14.00    Types: Cigarettes     Last attempt to quit: 05/15/1977    Years since quitting: 40.0  . Smokeless tobacco: Former Systems developer    Types: [place] date: 01/13/2013  Substance Use Topics  . Alcohol use: Yes    Alcohol/week: 0.6 oz    Types: 1 Cans of beer per week  . Drug use: No    Marco Morris reports that he quit smoking about 40 years ago. His smoking use included cigarettes. He has a 14.00 pack-year smoking history. He quit smokeless tobacco use about 4 years ago. His smokeless tobacco use included chew. He reports that he drinks about 0.6 oz of alcohol per week. He reports that he does not use drugs.  Tobacco Cessation: Former smoker, 14 pack year smoking history.  Past surgical hx, Family hx, Social hx all reviewed.  Current Outpatient Medications on File Prior to Visit  Medication Sig  . albuterol (PROVENTIL HFA;VENTOLIN HFA) 108 (90 Base) MCG/ACT inhaler Inhale 2 puffs into the lungs every 6 (six) hours as needed for wheezing or shortness of breath.  Marland Kitchen aspirin 81 MG tablet Take 81 mg by mouth daily.  Marland Kitchen atorvastatin (LIPITOR) 20 MG tablet Take 20 mg by mouth every evening.   . budesonide-formoterol (SYMBICORT) 160-4.5 MCG/ACT inhaler Inhale 2 puffs into the lungs 2 (two) times daily.  Marland Kitchen levothyroxine (SYNTHROID, LEVOTHROID) 125 MCG tablet Take 125 mcg by mouth daily before breakfast.   . lisinopril (PRINIVIL,ZESTRIL) 10 MG tablet TAKE 1 TABLET (10 MG TOTAL) BY MOUTH DAILY.  . Multiple Vitamins-Minerals (CENTRUM SILVER 50+MEN) TABS Take 1 tablet by mouth daily.  Marland Kitchen omeprazole (PRILOSEC) 20 MG capsule Take 1 capsule (20 mg total) by mouth daily.  . potassium chloride SA (K-DUR,KLOR-CON) 20 MEQ tablet Take 1 tablet (20 mEq total) by mouth daily.  . ranitidine (ZANTAC) 150 MG tablet Take 150 mg by mouth daily.   No current facility-administered medications on file prior to visit.      Allergies  Allergen Reactions  . Codeine Nausea And Vomiting    Review Of Systems:  Constitutional:   No  weight loss,  night sweats,  Fevers, chills, fatigue, or  lassitude.  HEENT:   No headaches,  Difficulty swallowing,  Tooth/dental problems, or  Sore throat,                No sneezing, itching, ear ache, nasal congestion, post nasal drip,   CV:  No chest pain,  Orthopnea, PND, swelling in lower extremities, anasarca, dizziness, palpitations, syncope.   GI  No heartburn, indigestion, abdominal pain, nausea, vomiting, diarrhea, change in bowel habits, loss of appetite, bloody stools.   Resp: + shortness of breath with exertion or at rest.  No excess mucus, no productive cough,  + non-productive cough,  No coughing up of blood.  No change in  color of mucus.  No wheezing.  No chest wall deformity  Skin: no rash or lesions.  GU: no dysuria, change in color of urine, no urgency or frequency.  No flank pain, no hematuria   MS:  No joint pain or swelling.  No decreased range of motion.  No back pain.  Psych:  No change in mood or affect. No depression or anxiety.  No memory loss.   Vital Signs BP (!) 160/90 (BP Location: Left Arm, Cuff Size: Normal)   Pulse 75   Ht 5\' 11"  (1.803 m)   Wt 196 lb 12.8 oz (89.3 kg)   SpO2 99%   BMI 27.45 kg/m    Physical Exam:  General- No distress,  A&Ox3, pleasant ENT: No sinus tenderness, TM clear, pale nasal mucosa, no oral exudate,+ post nasal drip, no LAN Cardiac: S1, S2, regular rate and rhythm, no murmur Chest: No wheeze/ rales/ dullness; no accessory muscle use, no nasal flaring, no sternal retractions, diminished per bases, rhonchi Abd.: Soft Non-tender, non-distended Ext: No clubbing cyanosis, edema Neuro:  normal strength, MAE x 4, A&O x 3 Skin: No rashes, warm and dry, intact Psych: normal mood and behavior   Assessment/Plan  COPD, group B, by GOLD 2017 classification ([place]) Pt. With frequent breakthrough and dyspnea on Symbicort alone. FENO is 38 ppb Plan: We will do a therapeutic trial of Trelegy One puff once daily Rinse mouth after use Use  this medication instead of your Symbicort Continue to use rescue inhaler as needed. Xyzol once daily for 2 weeks Follow up in 2 weeks with Judson Roch NP  to evaluate therapy changes, with FENO at follow up. Please contact office for sooner follow up if symptoms do not improve or worsen or seek emergency care     GERD Continue Protonix  HTN Follow up with PCP/ cardiologist  re: Blood Pressure Consider changing Lisinopril as patient has a dry cough  Magdalen Spatz, NP 06/04/2017  10:03 AM

## 2017-06-04 NOTE — Addendum Note (Signed)
Addended by: Jannette Spanner on: 06/04/2017 10:34 AM   Modules accepted: Orders

## 2017-06-04 NOTE — Assessment & Plan Note (Signed)
Pt. With frequent breakthrough and dyspnea on Symbicort alone. FENO is 38 ppb Plan: We will do a therapeutic trial of Trelegy One puff once daily Rinse mouth after use Use this medication instead of your Symbicort Continue to use rescue inhaler as needed. Xyzol once daily for 2 weeks Follow up in 2 weeks with Judson Roch NP  to evaluate therapy changes, with FENO at follow up. Please contact office for sooner follow up if symptoms do not improve or worsen or seek emergency care

## 2017-06-04 NOTE — Patient Instructions (Addendum)
It is good to see you today. We will do a therapeutic trial of Trelegy One puff once daily Rinse mouth after use Use this medication instead of your Symbicort Continue to use rescue inhaler as needed. Xyzol once daily for 2 weeks Follow up in 2 weeks with Judson Roch NP  to evaluate therapy changes, with FENO at follow up. Please contact office for sooner follow up if symptoms do not improve or worsen or seek emergency care   Cough may be secondary to Lisinopril>> Consider changing next OV in 2 weeks

## 2017-06-18 ENCOUNTER — Ambulatory Visit: Payer: Managed Care, Other (non HMO) | Admitting: Acute Care

## 2017-06-18 ENCOUNTER — Encounter: Payer: Self-pay | Admitting: Acute Care

## 2017-06-18 VITALS — BP 132/70 | HR 67 | Ht 71.0 in | Wt 197.4 lb

## 2017-06-18 DIAGNOSIS — R05 Cough: Secondary | ICD-10-CM

## 2017-06-18 DIAGNOSIS — R059 Cough, unspecified: Secondary | ICD-10-CM

## 2017-06-18 DIAGNOSIS — J449 Chronic obstructive pulmonary disease, unspecified: Secondary | ICD-10-CM | POA: Diagnosis not present

## 2017-06-18 LAB — NITRIC OXIDE: NITRIC OXIDE: 17

## 2017-06-18 NOTE — Progress Notes (Signed)
History of Present Illness Marco Morris is a 62 y.o. male former smoker ( Quit 1979, 14 pack year smoking history) with COPD Gold B, a fib and HTN seen by Dr. Vaughan Morris for Dyspnea.  HPI: 62 year old with history of atrial fibrillation, hyperlipidemia, hypothyroidism, hypertension. He has complaints of dyspnea with activity and rest for the past several yearswhich is getting worse. He has non-productive cough with no sputum production. Denies any wheeze, fevers, chills. He has history of atrial fibrillation status post ablation in 2015 and has been stable since then. He has history of GERD. Symptoms are well controlled on Zantac. There are no seasonal allergies, postnasal drip. He is physically active and does regular exercise with running and swimming.  06/18/2017 Follow up for cough/ COPD Flare. Pt. Was seen 06/04/2017 for COPD Flare. Plan after that visit was as follows:  COPD We will do a therapeutic trial of Trelegy One puff once daily Rinse mouth after use Use this medication instead of your Symbicort Continue to use rescue inhaler as needed. Xyzol once daily for 2 weeks Follow up in 2 weeks with Marco Roch NP  to evaluate therapy changes, with FENO at follow up. Please contact office for sooner follow up if symptoms do not improve or worsen or seek emergency care     GERD Continue Protonix  HTN Follow up with PCP/ cardiologist  re: Blood Pressure Consider changing Lisinopril as patient has a dry cough  Pt presents today.He states he likes the Trelegy. He has noticed less shortness of breath. He does continue to use rescue 1-2 times a day, especially prior to his work outs.. He states his cough is better. His wife states he has less wheezing at night. He is compliant with his Marco Morris and protonix.Marland Kitchen He denies fever, chest pain, orthopnea or hemoptysis. FENO has dropped to 16 from 38 one week ago. He states he has followed up with PCP re: blood pressure.   Test  Results: FENO 06/18/2017>> 16 ppb FENO:>> 05/2017>> 38 ppb Alpha 1>> 02/2017>> 136 PI*MM  CBC Latest Ref Rng & Units 02/28/2017 10/02/2013 07/16/2013  WBC 4.0 - 10.5 K/uL 8.2 13.6(H) 7.3  Hemoglobin 13.0 - 17.0 g/dL 15.3 14.6 14.1  Hematocrit 39.0 - 52.0 % 45.7 43.9 43.0  Platelets 150.0 - 400.0 K/uL 194.0 229.0 175.0    BMP Latest Ref Rng & Units 10/17/2013 10/10/2013 10/02/2013  Glucose 70 - 99 mg/dL 104(H) 112(H) 92  BUN 6 - 23 mg/dL 11 15 15   Creatinine 0.4 - 1.5 mg/dL 1.2 1.29 1.5  Sodium 135 - 145 mEq/L 137 139 138  Potassium 3.5 - 5.1 mEq/L 3.8 4.4 3.9  Chloride 96 - 112 mEq/L 103 101 99  CO2 19 - 32 mEq/L 26 27 30   Calcium 8.4 - 10.5 mg/dL 9.6 9.4 9.6    BNP No results found for: BNP  ProBNP    Component Value Date/Time   PROBNP 56.2 06/05/2013 0850    PFT    Component Value Date/Time   FEV1PRE 2.99 02/05/2017 0857   FEV1POST 3.54 02/05/2017 0857   FVCPRE 3.85 02/05/2017 0857   FVCPOST 4.46 02/05/2017 0857   TLC 7.77 02/05/2017 0857   DLCOUNC 28.16 02/05/2017 0857   PREFEV1FVCRT 78 02/05/2017 0857   PSTFEV1FVCRT 79 02/05/2017 0857    No results found.   Past medical hx Past Medical History:  Diagnosis Date  . Blood dyscrasia    high risk for blood clot formation  . Chronic kidney disease   .  History of renal calculi   . Hypercholesterolemia   . Hypertension   . Hypothyroidism   . Neuromuscular disorder (HCC)    numbness left foot  . PAF (paroxysmal atrial fibrillation) (HCC)    a.  anticoagulated with Xarelto b. s/p Tikosyn loading 07/2013 c. s/p PVI  . Tachycardia-bradycardia syndrome ([place])    a. post termination pauses b. s/p STJ leadless pacemaker     Social History   Tobacco Use  . Smoking status: Former Smoker    Packs/day: 1.00    Years: 14.00    Pack years: 14.00    Types: Cigarettes    Last attempt to quit: 05/15/1977    Years since quitting: 40.1  . Smokeless tobacco: Former Systems developer    Types: [place] date: 01/13/2013  Substance Use  Topics  . Alcohol use: Yes    Alcohol/week: 0.6 oz    Types: 1 Cans of beer per week  . Drug use: No    Mr.Marco Morris reports that he quit smoking about 40 years ago. His smoking use included cigarettes. He has a 14.00 pack-year smoking history. He quit smokeless tobacco use about 4 years ago. His smokeless tobacco use included chew. He reports that he drinks about 0.6 oz of alcohol per week. He reports that he does not use drugs.  Tobacco Cessation: Former smoker quit 1979 with a 14 pack year smoking history.  Past surgical hx, Family hx, Social hx all reviewed.  Current Outpatient Medications on File Prior to Visit  Medication Sig  . albuterol (PROVENTIL HFA;VENTOLIN HFA) 108 (90 Base) MCG/ACT inhaler Inhale 2 puffs into the lungs every 6 (six) hours as needed for wheezing or shortness of breath.  Marland Kitchen aspirin 81 MG tablet Take 81 mg by mouth daily.  Marland Kitchen atorvastatin (LIPITOR) 20 MG tablet Take 20 mg by mouth every evening.   . Fluticasone-Umeclidin-Vilant (TRELEGY ELLIPTA) 100-62.5-25 MCG/INH AEPB Inhale 1 puff into the lungs daily.  Marland Kitchen levothyroxine (SYNTHROID, LEVOTHROID) 125 MCG tablet Take 125 mcg by mouth daily before breakfast.   . lisinopril (PRINIVIL,ZESTRIL) 10 MG tablet TAKE 1 TABLET (10 MG TOTAL) BY MOUTH DAILY.  . Multiple Vitamins-Minerals (CENTRUM SILVER 50+MEN) TABS Take 1 tablet by mouth daily.  Marland Kitchen omeprazole (PRILOSEC) 20 MG capsule Take 1 capsule (20 mg total) by mouth daily.  . potassium chloride SA (K-DUR,KLOR-CON) 20 MEQ tablet Take 1 tablet (20 mEq total) by mouth daily.  . budesonide-formoterol (SYMBICORT) 160-4.5 MCG/ACT inhaler Inhale 2 puffs into the lungs 2 (two) times daily. (Patient not taking: Reported on 06/18/2017)   No current facility-administered medications on file prior to visit.      Allergies  Allergen Reactions  . Codeine Nausea And Vomiting    Review Of Systems:  Constitutional:   No  weight loss, night sweats,  Fevers, chills, fatigue, or   lassitude.  HEENT:   No headaches,  Difficulty swallowing,  Tooth/dental problems, or  Sore throat,                No sneezing, itching, ear ache, nasal congestion, post nasal drip,   CV:  No chest pain,  Orthopnea, PND, swelling in lower extremities, anasarca, dizziness, palpitations, syncope.   GI  No heartburn, indigestion, abdominal pain, nausea, vomiting, diarrhea, change in bowel habits, loss of appetite, bloody stools.   Resp: + shortness of breath with exertion none at rest.  Baseline  excess mucus, no productive cough,  No non-productive cough,  No coughing up of blood.  No change in color of mucus.  rare wheezing.  No chest wall deformity  Skin: no rash or lesions, warm dry and intact.  GU: no dysuria, change in color of urine, no urgency or frequency.  No flank pain, no hematuria   MS:  No joint pain or swelling.  No decreased range of motion.  No back pain.  Psych:  No change in mood or affect. No depression or anxiety.  No memory loss.   Vital Signs BP 132/70 (BP Location: Left Arm, Cuff Size: Normal)   Pulse 67   Ht 5\' 11"  (1.803 m)   Wt 197 lb 6.4 oz (89.5 kg)   SpO2 96%   BMI 27.53 kg/m    Physical Exam:  General- No distress,  A&Ox3, pleasant ENT: No sinus tenderness, TM clear, pale nasal mucosa, no oral exudate,no post nasal drip, no LAN Cardiac: S1, S2, regular rate and rhythm, no murmur Chest: No wheeze/ rales/ dullness; no accessory muscle use, no nasal flaring, no sternal retractions Abd.: Soft Non-tender, nondistended, bowel sounds positive Ext: No clubbing cyanosis, edema Neuro:  normal strength, moving all extremities x4, alert and oriented x3, appropriate Skin: No rashes, warm and dry, no lesions noted Psych: normal mood and behavior   Assessment/Plan  COPD, group B, by GOLD 2017 classification (HCC) Resolving flare Felt Trelegy with a good option for him Less shortness of breath Suspect an allergic component played a part in  exacerbation Plan FENO today I am glad you are doing better. We will prescribe Trelegy 1 puff once daily. Rinse mouth after use. Rescue inhaler as needed. Continue Xyzol daily as you have been doing. Please continue to follow up with PCP about BP. Let us know if you want to do a sleep study. Note your daily symptoms > remember "red flags" for COPD:  Increase in cough, increase in sputum production, increase in shortness of breath or activity intolerance. If you notice these symptoms, please call to be seen.       Magdalen Spatz, NP 06/18/2017  1:33 PM

## 2017-06-18 NOTE — Assessment & Plan Note (Addendum)
Resolving flare Felt Trelegy with a good option for him Less shortness of breath Suspect an allergic component played a part in exacerbation Plan FENO today I am glad you are doing better. We will prescribe Trelegy 1 puff once daily. Rinse mouth after use. Rescue inhaler as needed. Continue Xyzol daily as you have been doing. Please continue to follow up with PCP about BP. Let us know if you want to do a sleep study. Note your daily symptoms > remember "red flags" for COPD:  Increase in cough, increase in sputum production, increase in shortness of breath or activity intolerance. If you notice these symptoms, please call to be seen.

## 2017-06-18 NOTE — Patient Instructions (Addendum)
It is good to see you today FENO today I am glad you are doing better. We will prescribe Trelegy 1 puff once daily. Rinse mouth after use. Rescue inhaler as needed. Continue Xyzol daily as you have been doing. Please continue to follow up with PCP about BP. Let us know if you want to do a sleep study. Follow up in 3 months with  Note your daily symptoms > remember "red flags" for COPD:  Increase in cough, increase in sputum production, increase in shortness of breath or activity intolerance. If you notice these symptoms, please call to be seen.

## 2017-06-29 ENCOUNTER — Telehealth: Payer: Self-pay | Admitting: Acute Care

## 2017-06-29 MED ORDER — FLUTICASONE-UMECLIDIN-VILANT 100-62.5-25 MCG/INH IN AEPB: 1.0000 | INHALATION_SPRAY | Freq: Every day | RESPIRATORY_TRACT | 4 refills | Status: DC

## 2017-06-29 NOTE — Telephone Encounter (Signed)
Left detailed message for patient stating that I have sent RX for Trelegy to [facility] on [address].

## 2017-07-02 ENCOUNTER — Telehealth: Payer: Self-pay | Admitting: Acute Care

## 2017-07-02 NOTE — Telephone Encounter (Signed)
Spoke with patient, he is aware that he is supposed to be on Trelegy.

## 2017-08-01 ENCOUNTER — Encounter: Payer: Managed Care, Other (non HMO) | Admitting: Nurse Practitioner

## 2017-08-03 ENCOUNTER — Ambulatory Visit: Payer: Managed Care, Other (non HMO) | Admitting: Nurse Practitioner

## 2017-08-03 VITALS — BP 114/72 | HR 88 | Ht 71.0 in | Wt 196.2 lb

## 2017-08-03 DIAGNOSIS — I10 Essential (primary) hypertension: Secondary | ICD-10-CM

## 2017-08-03 DIAGNOSIS — I480 Paroxysmal atrial fibrillation: Secondary | ICD-10-CM

## 2017-08-03 DIAGNOSIS — J449 Chronic obstructive pulmonary disease, unspecified: Secondary | ICD-10-CM | POA: Diagnosis not present

## 2017-08-03 DIAGNOSIS — I495 Sick sinus syndrome: Secondary | ICD-10-CM

## 2017-08-03 NOTE — Patient Instructions (Signed)
Medication Instructions:   Your physician recommends that you continue on your current medications as directed. Please refer to the Current Medication list given to you today.    If you need a refill on your cardiac medications before your next appointment, please call your pharmacy.  Labwork: NONE ORDERED  TODAY    Testing/Procedures: NONE ORDERED  TODAY    Follow-Up:  Your physician wants you to follow-up in:  IN  6  MONTHS WITH  SEILER   You will receive a reminder letter in the mail two months in advance. If you don't receive a letter, please call our office to schedule the follow-up appointment.     Any Other Special Instructions Will Be Listed Below (If Applicable).                                                                                                                                                   

## 2017-08-03 NOTE — Progress Notes (Signed)
Electrophysiology Office Note Date: 08/03/2017  ID:  Marco Morris, DOB 1955-10-16, MRN [id number]  PCP: Jilda Panda, MD Primary Cardiologist: [place] Electrophysiologist: [place]  CC: Pacemaker follow-up  Marco Morris is a 62 y.o. male seen today for Dr Rayann Heman.  He presents today for routine electrophysiology followup.  Since last being seen in our clinic, the patient reports doing relatively well. He has not had clinical recurrence of AF.  He has seen pulmonary for abnormal PFT's and has been diagnosed with COPD.  He has had improvement in shortness of breath with treatment. He is using Albuterol inhaler almost daily.  GERD is improved with switch to prilosec.  He denies chest pain, PND, orthopnea, nausea, vomiting, dizziness, syncope.   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 Blood dyscrasia    high risk for blood clot formation  . Chronic kidney disease   . History of renal calculi   . Hypercholesterolemia   . Hypertension   . Hypothyroidism   . Neuromuscular disorder (HCC)    numbness left foot  . PAF (paroxysmal atrial fibrillation) (HCC)    a.  anticoagulated with Xarelto b. s/p Tikosyn loading 07/2013 c. s/p PVI  . Tachycardia-bradycardia syndrome ([place])    a. post termination pauses b. s/p STJ leadless pacemaker   Past Surgical History:  Procedure Laterality Date  . ATRIAL FIBRILLATION ABLATION [name] 10/09/2013   Procedure: ATRIAL FIBRILLATION ABLATION;  Surgeon: Coralyn Mark, MD;  Location: [facility] CATH LAB;  Service: Cardiovascular;  Laterality: [name];  . CHOLECYSTECTOMY  ~ 2000  . LITHOTRIPSY  2004; ~ 05/2013  . PERMANENT PACEMAKER INSERTION [name] 07/18/2013   STJ Leadless pacemaker implanted by Dr Rayann Heman for symptomatic pauses  . TEE WITHOUT CARDIOVERSION [name] 10/08/2013   Procedure: TRANSESOPHAGEAL ECHOCARDIOGRAM (TEE);  Surgeon: Sanda Klein, MD;  Location: [facility] ENDOSCOPY;  Service:  Cardiovascular;  Laterality: [name];    Current Outpatient Medications  Medication Sig Dispense Refill  . albuterol (PROVENTIL HFA;VENTOLIN HFA) 108 (90 Base) MCG/ACT inhaler Inhale 2 puffs into the lungs every 6 (six) hours as needed for wheezing or shortness of breath. 1 Inhaler 6  . aspirin 81 MG tablet Take 81 mg by mouth daily.    Marland Kitchen atorvastatin (LIPITOR) 20 MG tablet Take 20 mg by mouth every evening.     . Fluticasone-Umeclidin-Vilant (TRELEGY ELLIPTA) 100-62.5-25 MCG/INH AEPB Inhale 1 puff into the lungs daily. 1 each 4  . levocetirizine (XYZAL) 5 MG tablet Take 5 mg by mouth every evening.    Marland Kitchen levothyroxine (SYNTHROID, LEVOTHROID) 125 MCG tablet Take 125 mcg by mouth daily before breakfast.     . lisinopril (PRINIVIL,ZESTRIL) 10 MG tablet TAKE 1 TABLET (10 MG TOTAL) BY MOUTH DAILY. 30 tablet 0  . Multiple Vitamins-Minerals (CENTRUM SILVER 50+MEN) TABS Take 1 tablet by mouth daily.    Marland Kitchen omeprazole (PRILOSEC) 20 MG capsule Take 1 capsule (20 mg total) by mouth daily. 90 capsule 3  . potassium chloride SA (KLOR-CON M20) 20 MEQ tablet K[place]-Con M20 mEq tablet,extended release     No current facility-administered medications for this visit.     Allergies:   Codeine   Social History: Social History   Socioeconomic History  . Marital status: Married    Spouse name: Not on file  . Number of children: 2  . Years of education: Not on file  . Highest education level: Not on file  Occupational History  .  Occupation: DRIVER    Employer: [employer]  Social Needs  . Financial resource strain: Not on file  . Food insecurity:    Worry: Not on file    Inability: Not on file  . Transportation needs:    Medical: Not on file    Non-medical: Not on file  Tobacco Use  . Smoking status: Former Smoker    Packs/day: 1.00    Years: 14.00    Pack years: 14.00    Types: Cigarettes    Last attempt to quit: 05/15/1977    Years since quitting: 40.2  . Smokeless tobacco: Former Systems developer     Types: [place] date: 01/13/2013  Substance and Sexual Activity  . Alcohol use: Yes    Alcohol/week: 0.6 oz    Types: 1 Cans of beer per week  . Drug use: No  . Sexual activity: Not Currently  Lifestyle  . Physical activity:    Days per week: Not on file    Minutes per session: Not on file  . Stress: Not on file  Relationships  . Social connections:    Talks on phone: Not on file    Gets together: Not on file    Attends religious service: Not on file    Active member of club or organization: Not on file    Attends meetings of clubs or organizations: Not on file    Relationship status: Not on file  . Intimate partner violence:    Fear of current or ex partner: Not on file    Emotionally abused: Not on file    Physically abused: Not on file    Forced sexual activity: Not on file  Other Topics Concern  . 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    Family History: Family History  Problem Relation Age of Onset  . Heart attack Mother   . Stroke Mother   . Diabetes Mother   . Cancer - Ovarian Mother   . Leukemia Mother   . Heart attack Father   . Stroke Father   . Diabetes Father   . Cancer - Other Father   . Hypertension Brother      Review of Systems: All other systems reviewed and are otherwise negative except as noted above.   Physical Exam: VS:  BP 114/72 (BP Location: Left Arm)   Pulse 88   Ht 5\' 11"  (1.803 m)   Wt 196 lb 3.2 oz (89 kg)   SpO2 96%   BMI 27.36 kg/m  , BMI Body mass index is 27.36 kg/m.  GEN- The patient is well appearing, alert and oriented x 3 today.   HEENT: normocephalic, atraumatic; sclera clear, conjunctiva pink; hearing intact; oropharynx clear; neck supple Lungs- Clear to ausculation bilaterally, normal work of breathing.  No wheezes, rales, rhonchi Heart- Regular rate and rhythm GI- soft, non-tender, non-distended, bowel sounds present Extremities- no clubbing, cyanosis, or edema;  DP/PT/radial pulses 2+ bilaterally MS- no significant deformity or atrophy Skin- warm and dry, no rash or lesion Psych- euthymic mood, full affect Neuro- strength and sensation are intact  PPM Interrogation- reviewed in detail today,  See PACEART report  EKG:  EKG is not ordered today.  Recent Labs: 02/28/2017: Hemoglobin 15.3; Platelets 194.0   Wt Readings from Last 3 Encounters:  08/03/17 196 lb 3.2 oz (89 kg)  06/18/17 197 lb 6.4 oz (89.5 kg)  06/04/17 196 lb 12.8 oz (89.3 kg)  Other studies Reviewed: Additional studies/ records that were reviewed today include: Dr Jackalyn Lombard office notes  Assessment and Plan:  1.  Tachy/brady Normal PPM function See Claudia Desanctis Art report No changes today We discussed Leadless pacemaker battery advisory today as well as the issue with device button at extraction.  No indication for device replacement at this time.  2.  Paroxysmal atrial fibrillation Maintaining SR post ablation without AAD therapy CHADS2VASC is 1 - continue ASA  3.  HTN Stable No change required today  4. COPD Appreciate pulmonary's assistance I have encouraged him to follow up with them regarding frequent Albuterol use.   5.  GERD Improved with switch to Omeprazole.   Current medicines are reviewed at length with the patient today.   The patient does not have concerns regarding his medicines.  The following changes were made today: none  Labs/ tests ordered today include: none   Disposition:   Follow up with me every 6 months, Dr [place] as scheduled   Signed, Chanetta Marshall, NP 08/03/2017 11:34 AM  [facility] [address] [place] [address] [phone] (office) [phone] (fax)

## 2017-08-06 LAB — CUP PACEART INCLINIC DEVICE CHECK
Date Time Interrogation Session: 20190325102028
MDC IDC PG IMPLANT DT: 20150306

## 2017-10-11 ENCOUNTER — Encounter: Payer: Managed Care, Other (non HMO) | Admitting: Nurse Practitioner

## 2017-10-26 NOTE — Progress Notes (Signed)
Marco Morris Date of Birth: 06-12-1955   History of Present Illness: Marco Morris is seen for follow up today. Last seen by me in 2017. He has a history of atrial fibrillation dating back to 2003. This initially was managed with beta blocker therapy. Due to excessive fatigue he was switched to diltiazem. In 2015 he had increased Afib with post termination pauses. He had a leadless pacemaker inserted by Dr. Rayann Morris and was started on Tikosyn. He failed Tikosyn and had an Afib ablation in May 2015. Tikosyn was discontinued.   He did have a normal ETT in November 2015. He is on ASA only with [place] vasc score of 1.   He has been followed regularly in the EP clinic. In 2017 complained of increased SOB. Echo was unremarkable. Later PFTs showed evidence of COPD with bronchodilator response. Seen by pulmonary. CXR showed some emphysema. Diagnosed with COPD Gold stage B.  He still notes some dyspnea. Scheduled to see pulmonary today. No chest pain, palpitations, dizziness, syncope. Still working driving a truck. Exercises regularly.   Current Outpatient Medications on File Prior to Visit  Medication Sig Dispense Refill  . albuterol (PROVENTIL HFA;VENTOLIN HFA) 108 (90 Base) MCG/ACT inhaler Inhale 2 puffs into the lungs every 6 (six) hours as needed for wheezing or shortness of breath. 1 Inhaler 6  . aspirin 81 MG tablet Take 81 mg by mouth daily.    Marland Kitchen atorvastatin (LIPITOR) 20 MG tablet Take 20 mg by mouth every evening.     . Fluticasone-Umeclidin-Vilant (TRELEGY ELLIPTA) 100-62.5-25 MCG/INH AEPB Inhale 1 puff into the lungs daily. 1 each 4  . levocetirizine (XYZAL) 5 MG tablet Take 5 mg by mouth every evening.    Marland Kitchen levothyroxine (SYNTHROID, LEVOTHROID) 125 MCG tablet Take 125 mcg by mouth daily before breakfast.     . lisinopril (PRINIVIL,ZESTRIL) 10 MG tablet TAKE 1 TABLET (10 MG TOTAL) BY MOUTH DAILY. 30 tablet 0  . Multiple Vitamins-Minerals (CENTRUM SILVER 50+MEN) TABS Take 1 tablet by mouth daily.     Marland Kitchen omeprazole (PRILOSEC) 20 MG capsule Take 1 capsule (20 mg total) by mouth daily. 90 capsule 3  . potassium chloride SA (KLOR-CON M20) 20 MEQ tablet Klor-Con M20 mEq tablet,extended release     No current facility-administered medications on file prior to visit.     Allergies  Allergen Reactions  . Codeine Nausea And Vomiting    Past Medical History:  Diagnosis Date  . Blood dyscrasia    high risk for blood clot formation  . Chronic kidney disease   . History of renal calculi   . Hypercholesterolemia   . Hypertension   . Hypothyroidism   . Neuromuscular disorder (HCC)    numbness left foot  . PAF (paroxysmal atrial fibrillation) (HCC)    a.  anticoagulated with Xarelto b. s/p Tikosyn loading 07/2013 c. s/p PVI  . Tachycardia-bradycardia syndrome ([place])    a. post termination pauses b. s/p STJ leadless pacemaker    Past Surgical History:  Procedure Laterality Date  . ATRIAL FIBRILLATION ABLATION [name] 10/09/2013   Procedure: ATRIAL FIBRILLATION ABLATION;  Surgeon: Marco Mark, MD;  Location: [place] CATH LAB;  Service: Cardiovascular;  Laterality: [name];  . CHOLECYSTECTOMY  ~ 2000  . LITHOTRIPSY  2004; ~ 05/2013  . PERMANENT PACEMAKER INSERTION [name] 07/18/2013   STJ Leadless pacemaker implanted by Dr Marco Morris for symptomatic pauses  . TEE WITHOUT CARDIOVERSION [name] 10/08/2013   Procedure: TRANSESOPHAGEAL ECHOCARDIOGRAM (TEE);  Surgeon: Marco Klein, MD;  Location[facility]  ENDOSCOPY;  Service: Cardiovascular;  Laterality: [name];    Social History   Tobacco Use  Smoking Status Former Smoker  . Packs/day: 1.00  . Years: 14.00  . Pack years: 14.00  . Types: Cigarettes  . Last attempt to quit: 05/15/1977  . Years since quitting: 40.4  Smokeless Tobacco Former Systems developer  . Types: Chew  . Quit date: 01/13/2013    Social History   Substance and Sexual Activity  Alcohol Use Yes  . Alcohol/week: 0.6 oz  . Types: 1 Cans of beer per week    Family History  Problem Relation Age of Onset  .  Heart attack Mother   . Stroke Mother   . Diabetes Mother   . Cancer - Ovarian Mother   . Leukemia Mother   . Heart attack Father   . Stroke Father   . Diabetes Father   . Cancer - Other Father   . Hypertension Brother     Review of Systems: As noted in history of present illness.  All other systems were reviewed and are negative.  Physical Exam: BP 114/68   Pulse 73   Ht 5\' 11"  (1.803 m)   Wt 191 lb 9.6 oz (86.9 kg)   BMI 26.72 kg/m  GENERAL:  Well appearing HEENT:  PERRL, EOMI, sclera are clear. Oropharynx is clear. NECK:  No jugular venous distention, carotid upstroke brisk and symmetric, no bruits, no thyromegaly or adenopathy LUNGS:  Clear to auscultation bilaterally CHEST:  Unremarkable HEART:  RRR,  PMI not displaced or sustained,S1 and S2 within normal limits, no S3, no S4: no clicks, no rubs, no murmurs ABD:  Soft, nontender. BS +, no masses or bruits. No hepatomegaly, no splenomegaly EXT:  2 + pulses throughout, no edema, no cyanosis no clubbing SKIN:  Warm and dry.  No rashes NEURO:  Alert and oriented x 3. Cranial nerves II through XII intact. PSYCH:  Cognitively intact   LABORATORY DATA: Lab Results  Component Value Date   WBC 8.2 02/28/2017   HGB 15.3 02/28/2017   HCT 45.7 02/28/2017   PLT 194.0 02/28/2017   GLUCOSE 104 (H) 10/17/2013   CHOL  12/23/2008    116        ATP III CLASSIFICATION:  <200     mg/dL   Desirable  200-239  mg/dL   Borderline High  >=240    mg/dL   High          TRIG 138 12/23/2008   HDL 35 (L) 12/23/2008   LDLCALC  12/23/2008    53        Total Cholesterol/HDL:CHD Risk Coronary Heart Disease Risk Table                     Men   Women  1/2 Average Risk   3.4   3.3  Average Risk       5.0   4.4  2 X Average Risk   9.6   7.1  3 X Average Risk  23.4   11.0        Use the calculated Patient Ratio above and the CHD Risk Table to determine the patient's CHD Risk.        ATP III CLASSIFICATION (LDL):  <100     mg/dL    Optimal  100-129  mg/dL   Near or Above                    Optimal  130-159  mg/dL   Borderline  160-189  mg/dL   High  >190     mg/dL   Very High   ALT 33 03/08/2009   AST 23 03/08/2009   NA 137 10/17/2013   K 3.8 10/17/2013   CL 103 10/17/2013   CREATININE 1.2 10/17/2013   BUN 11 10/17/2013   CO2 26 10/17/2013   HGBA1C 5.8 (H) 01/30/2013   Ecg: 08/05/15: NSR, normal. Normal. Rate 73.  I have personally reviewed and interpreted this study.  Echo 02/28/16: Study Conclusions  - Left ventricle: The cavity size was normal. Wall thickness was   normal. Systolic function was normal. The estimated ejection   fraction was in the range of 60% to 65%. Wall motion was normal;   there were no regional wall motion abnormalities. - Pulmonary arteries: Systolic pressure was mildly increased. PA   peak pressure: 33 mm Hg (S).  Impressions:  - Normal LV systolic function; mild TR; mildly elevated pulmonary   pressure.  Assessment / Plan: 1. Paroxysmal atrial fibrillation. S/p leadless ventricular pacemaker for post termination pauses. Hasn't really used it since Afib ablation and good control of Afib. On ASA daily.  I will follow up in one year.   2. Hypercholesterolemia. Now on lipitor.  3. HTN well controlled on lisinopril.

## 2017-10-29 ENCOUNTER — Ambulatory Visit: Payer: Managed Care, Other (non HMO) | Admitting: Cardiology

## 2017-10-29 ENCOUNTER — Ambulatory Visit: Payer: Managed Care, Other (non HMO) | Admitting: Acute Care

## 2017-10-29 ENCOUNTER — Encounter: Payer: Self-pay | Admitting: Cardiology

## 2017-10-29 ENCOUNTER — Telehealth: Payer: Self-pay | Admitting: Acute Care

## 2017-10-29 ENCOUNTER — Encounter: Payer: Self-pay | Admitting: Acute Care

## 2017-10-29 ENCOUNTER — Ambulatory Visit (INDEPENDENT_AMBULATORY_CARE_PROVIDER_SITE_OTHER)
Admission: RE | Admit: 2017-10-29 | Discharge: 2017-10-29 | Disposition: A | Discharge status: Home / Self Care | Payer: Managed Care, Other (non HMO) | Source: Ambulatory Visit | Attending: Acute Care | Admitting: Acute Care

## 2017-10-29 VITALS — BP 114/68 | HR 73 | Ht 71.0 in | Wt 191.6 lb

## 2017-10-29 VITALS — BP 118/80 | HR 78 | Ht 71.0 in | Wt 190.0 lb

## 2017-10-29 DIAGNOSIS — R0600 Dyspnea, unspecified: Secondary | ICD-10-CM

## 2017-10-29 DIAGNOSIS — J45909 Unspecified asthma, uncomplicated: Secondary | ICD-10-CM | POA: Diagnosis not present

## 2017-10-29 DIAGNOSIS — I495 Sick sinus syndrome: Secondary | ICD-10-CM

## 2017-10-29 DIAGNOSIS — J449 Chronic obstructive pulmonary disease, unspecified: Secondary | ICD-10-CM | POA: Diagnosis not present

## 2017-10-29 DIAGNOSIS — I480 Paroxysmal atrial fibrillation: Secondary | ICD-10-CM | POA: Diagnosis not present

## 2017-10-29 DIAGNOSIS — I10 Essential (primary) hypertension: Secondary | ICD-10-CM | POA: Diagnosis not present

## 2017-10-29 MED ORDER — TIOTROPIUM BROMIDE MONOHYDRATE 2.5 MCG/ACT IN AERS: 2.0000 | INHALATION_SPRAY | Freq: Every day | RESPIRATORY_TRACT | 0 refills | Status: DC

## 2017-10-29 MED ORDER — BUDESONIDE-FORMOTEROL FUMARATE 160-4.5 MCG/ACT IN AERO: 2.0000 | INHALATION_SPRAY | Freq: Two times a day (BID) | RESPIRATORY_TRACT | 0 refills | Status: DC

## 2017-10-29 NOTE — Telephone Encounter (Signed)
Please call patient and let him know his CXR showed no acute changes. Thanks so much.

## 2017-10-29 NOTE — Assessment & Plan Note (Addendum)
Continues to have dyspnea despite addition of Trelegy States he has some initial shortness of breath after using Trelegy Unsure if this is related to anxiety / weather change  Runs on a treadmill 3 miles 5 times a week. Plan: CXR today We will call you with results Stop Trelegy We will try Symbicort 2 puffs twice daily Rinse mouth after use We will add Spiriva  2 puffs once daily. Pro Air as needed up to every 6 hours >> Do not over use Continue Protonix Continue Xyzol Follow up in 4 weeks to evaluate how you are doing. Please contact office for sooner follow up if symptoms do not improve or worsen or seek emergency care   At follow up consider Singulair instead of xyzol if no better.

## 2017-10-29 NOTE — Progress Notes (Signed)
History of Present Illness Marco Morris is a 62 y.o. male with former smoker ( Quit 1979, 14 pack year smoking history)with COPD Gold B with a ? Asthma component, a fib and HTN seen by Dr. Vaughan Browner for Dyspnea.   6/17/2019Follow up after Therapeutic Trial of  Trelegy.  Pt. Presents for follow up. He was initially started on Symbicort by Dr. Vaughan Browner, after being diagnosed with COPD Gold B, however he was using his rescue inhaler multiple times a day for breakthrough shortness of breath. He is very active, and runs   3 miles on a treadmill 5 days a week. We switched him to Trelegy, and he initially did very well, called and asked for a prescription, which was sent in. He presents today stating that  he has been compliant with his Trelegy every day. He uses his rescue approximately once daily before he runs. . Today, however, he is here complaining that the Trelegy makes him short of breath as soon as he uses it. He states this lasts about 2-3 minutes, and then goes away.He states he is following the directions for use as printed on the label in regard to his technique.He states this has just started. He is unsure is something else has changed. He wonders if it due to the change in temperature, as he notices a worsening of dyspnea when going form the [facility] to the heat outside. He denies any worsening of his COPD symptoms, no increase in secretions , change in color of secretions. No increase in wheezing .He denies fever, chest pain, orthopnea or hemoptysis. He states he is compliant with his Prilosec and his Xyzol.  Test Results: CXR 10/29/2017>>(personally reviewed by me)  Clear lungs , no active cardiopulmonary disease  FENO:>> 05/2017>> 38 ppb Alpha 1>> 02/2017>> 136 PI*MM  PFTs 02/05/17  FVC 4.46 (89%], FEV1 3.54 (94%), F/F 79, TLC 104%, DLCO 80% Minimal obstruction and diffusion defect with significant bronchodilator response. Mild air trapping.  CT chest 04/26/01-no lung mass, infiltrate,  adenopathy. Early emphysematous changes. CT abdomen pelvis 03/03/13-lung images are clear. Chest x-ray 01/24/17-mild hyperinflation. I reviewed all images personally    CBC Latest Ref Rng & Units 02/28/2017 10/02/2013 07/16/2013  WBC 4.0 - 10.5 K/uL 8.2 13.6(H) 7.3  Hemoglobin 13.0 - 17.0 g/dL 15.3 14.6 14.1  Hematocrit 39.0 - 52.0 % 45.7 43.9 43.0  Platelets 150.0 - 400.0 K/uL 194.0 229.0 175.0    BMP Latest Ref Rng & Units 10/17/2013 10/10/2013 10/02/2013  Glucose 70 - 99 mg/dL 104(H) 112(H) 92  BUN 6 - 23 mg/dL 11 15 15   Creatinine 0.4 - 1.5 mg/dL 1.2 1.29 1.5  Sodium 135 - 145 mEq/L 137 139 138  Potassium 3.5 - 5.1 mEq/L 3.8 4.4 3.9  Chloride 96 - 112 mEq/L 103 101 99  CO2 19 - 32 mEq/L 26 27 30   Calcium 8.4 - 10.5 mg/dL 9.6 9.4 9.6    BNP No results found for: BNP  ProBNP    Component Value Date/Time   PROBNP 56.2 06/05/2013 0850    PFT    Component Value Date/Time   FEV1PRE 2.99 02/05/2017 0857   FEV1POST 3.54 02/05/2017 0857   FVCPRE 3.85 02/05/2017 0857   FVCPOST 4.46 02/05/2017 0857   TLC 7.77 02/05/2017 0857   DLCOUNC 28.16 02/05/2017 0857   PREFEV1FVCRT 78 02/05/2017 0857   PSTFEV1FVCRT 79 02/05/2017 0857    Dg Chest 2 View  Result Date: 10/29/2017 CLINICAL DATA:  Dyspnea 1 month EXAM: CHEST - 2 VIEW  COMPARISON:  01/24/2017 FINDINGS: Heart size within normal limits. Pulmonary vascularity normal. Cardiac loop recorder unchanged. Lungs are clear without infiltrate effusion or mass. Negative skeletal structures. IMPRESSION: No active cardiopulmonary disease. Electronically Signed   By: Franchot Gallo M.D.   On: 10/29/2017 12:14     Past medical hx Past Medical History:  Diagnosis Date  . Blood dyscrasia    high risk for blood clot formation  . Chronic kidney disease   . History of renal calculi   . Hypercholesterolemia   . Hypertension   . Hypothyroidism   . Neuromuscular disorder (HCC)    numbness left foot  . PAF (paroxysmal atrial fibrillation)  (HCC)    a.  anticoagulated with Xarelto b. s/p Tikosyn loading 07/2013 c. s/p PVI  . Tachycardia-bradycardia syndrome ([place])    a. post termination pauses b. s/p STJ leadless pacemaker     Social History   Tobacco Use  . Smoking status: Former Smoker    Packs/day: 1.00    Years: 14.00    Pack years: 14.00    Types: Cigarettes    Last attempt to quit: 05/15/1977    Years since quitting: 40.4  . Smokeless tobacco: Former Systems developer    Types: [place] date: 01/13/2013  Substance Use Topics  . Alcohol use: Yes    Alcohol/week: 0.6 oz    Types: 1 Cans of beer per week  . Drug use: No    Marco Morris reports that he quit smoking about 40 years ago. His smoking use included cigarettes. He has a 14.00 pack-year smoking history. He quit smokeless tobacco use about 4 years ago. His smokeless tobacco use included chew. He reports that he drinks about 0.6 oz of alcohol per week. He reports that he does not use drugs.  Tobacco Cessation: Former smoker , 17 pack year smoking history quit 1979  Past surgical hx, Family hx, Social hx all reviewed.  Current Outpatient Medications on File Prior to Visit  Medication Sig  . albuterol (PROVENTIL HFA;VENTOLIN HFA) 108 (90 Base) MCG/ACT inhaler Inhale 2 puffs into the lungs every 6 (six) hours as needed for wheezing or shortness of breath.  Marland Kitchen aspirin 81 MG tablet Take 81 mg by mouth daily.  Marland Kitchen atorvastatin (LIPITOR) 20 MG tablet Take 20 mg by mouth every evening.   . Fluticasone-Umeclidin-Vilant (TRELEGY ELLIPTA) 100-62.5-25 MCG/INH AEPB Inhale 1 puff into the lungs daily.  Marland Kitchen levocetirizine (XYZAL) 5 MG tablet Take 5 mg by mouth every evening.  Marland Kitchen levothyroxine (SYNTHROID, LEVOTHROID) 125 MCG tablet Take 125 mcg by mouth daily before breakfast.   . lisinopril (PRINIVIL,ZESTRIL) 10 MG tablet TAKE 1 TABLET (10 MG TOTAL) BY MOUTH DAILY.  . Multiple Vitamins-Minerals (CENTRUM SILVER 50+MEN) TABS Take 1 tablet by mouth daily.  Marland Kitchen omeprazole (PRILOSEC) 20 MG  capsule Take 1 capsule (20 mg total) by mouth daily.  . potassium chloride SA (KLOR-CON M20) 20 MEQ tablet Klor-Con M20 mEq tablet,extended release   No current facility-administered medications on file prior to visit.      Allergies  Allergen Reactions  . Codeine Nausea And Vomiting    Review Of Systems:  Constitutional:   No  weight loss, night sweats,  Fevers, chills, fatigue, or  lassitude.  HEENT:   No headaches,  Difficulty swallowing,  Tooth/dental problems, or  Sore throat,                No sneezing, itching, ear ache, nasal congestion, post nasal drip,   CV:  No chest pain,  Orthopnea, PND, swelling in lower extremities, anasarca, dizziness, palpitations, syncope.   GI  No heartburn, indigestion, abdominal pain, nausea, vomiting, diarrhea, change in bowel habits, loss of appetite, bloody stools.   Resp: + shortness of breath with exertion less at rest.  No excess mucus, no productive cough,  No non-productive cough,  No coughing up of blood.  No change in color of mucus.  No wheezing.  No chest wall deformity  Skin: no rash or lesions.  GU: no dysuria, change in color of urine, no urgency or frequency.  No flank pain, no hematuria   MS:  No joint pain or swelling.  No decreased range of motion.  No back pain.  Psych:  No change in mood or affect. No depression or anxiety.  No memory loss.   Vital Signs BP 118/80 (BP Location: Left Arm, Cuff Size: Normal)   Pulse 78   Ht 5\' 11"  (1.803 m)   Wt 190 lb (86.2 kg)   SpO2 95%   BMI 26.50 kg/m    Physical Exam:  General- No distress,  A&Ox3, pleasant ENT: No sinus tenderness, TM clear, pale nasal mucosa, no oral exudate,no post nasal drip, no LAN Cardiac: S1, S2, regular rate and rhythm, no murmur Chest: No wheeze/ rales/ dullness; no accessory muscle use, no nasal flaring, no sternal retractions Abd.: Soft Non-tender, ND, BS +, non-obese Ext: No clubbing cyanosis, edema Neuro:  normal strength, MAE x 4, A&O x 3,  appropriate Skin: No rashes, warm and dry Psych: anxious   Assessment/Plan  Mild asthma Continues to have dyspnea despite addition of Trelegy States he has some initial shortness of breath after using Trelegy Unsure if this is related to anxiety / weather change  Runs on a treadmill 3 miles 5 times a week. Plan: CXR today We will call you with results Stop Trelegy We will try Symbicort 2 puffs twice daily Rinse mouth after use We will add Spiriva  2 puffs once daily. Pro Air as needed up to every 6 hours >> Do not over use Continue Protonix Continue Xyzol Follow up in 4 weeks to evaluate how you are doing. Please contact office for sooner follow up if symptoms do not improve or worsen or seek emergency care   At follow up consider Singulair instead of xyzol if no better.    COPD, group B, by GOLD 2017 classification ([place]) Continues to have breakthrough dyspnea on Trelegy with SOB x 2-3 minutes after use. Plan: CXR today We will call you with results Stop Trelegy We will try Symbicort 2 puffs twice daily Rinse mouth after use We will add Spiriva  2 puffs once daily. Pro Air as needed up to every 6 hours  Follow up in 4 weeks to evaluate how you are doing. Please contact office for sooner follow up if symptoms do not improve or worsen or seek emergency care   At follow up consider Singulair instead of xyzol if no better.      Magdalen Spatz, NP 10/29/2017  1:59 PM

## 2017-10-29 NOTE — Telephone Encounter (Signed)
ATC pt, no answer. Left message for pt to call back.  

## 2017-10-29 NOTE — Patient Instructions (Addendum)
CXR today We will call you with results Stop Trelegy We will try Symbicort 2 puffs twice daily Rinse mouth after use We will add Spiriva  2 puffs once daily. Pro Air as needed up to every 6 hours  Continue Protonix Continue Xyzol Follow up in 4 weeks to evaluate how you are doing. Please contact office for sooner follow up if symptoms do not improve or worsen or seek emergency care   At follow up consider Singulair instead of xyzol if no better.

## 2017-10-29 NOTE — Patient Instructions (Signed)
Continue your current therapy  I will see you in about a year

## 2017-10-29 NOTE — Assessment & Plan Note (Signed)
Continues to have breakthrough dyspnea on Trelegy with SOB x 2-3 minutes after use. Plan: CXR today We will call you with results Stop Trelegy We will try Symbicort 2 puffs twice daily Rinse mouth after use We will add Spiriva  2 puffs once daily. Pro Air as needed up to every 6 hours  Follow up in 4 weeks to evaluate how you are doing. Please contact office for sooner follow up if symptoms do not improve or worsen or seek emergency care   At follow up consider Singulair instead of xyzol if no better.

## 2017-10-31 NOTE — Telephone Encounter (Signed)
Left message at home number. Attempted to call preferred mobile number and got a busy signal.

## 2017-11-01 NOTE — Telephone Encounter (Signed)
I am closing this encounter because there is a result note in my box for this.

## 2017-11-12 ENCOUNTER — Telehealth: Payer: Self-pay | Admitting: Acute Care

## 2017-11-12 MED ORDER — TIOTROPIUM BROMIDE MONOHYDRATE 2.5 MCG/ACT IN AERS: 2.0000 | INHALATION_SPRAY | Freq: Every day | RESPIRATORY_TRACT | 5 refills | Status: DC

## 2017-11-12 MED ORDER — BUDESONIDE-FORMOTEROL FUMARATE 160-4.5 MCG/ACT IN AERO: 2.0000 | INHALATION_SPRAY | Freq: Two times a day (BID) | RESPIRATORY_TRACT | 5 refills | Status: DC

## 2017-11-12 NOTE — Telephone Encounter (Signed)
Spoke with pt. He is needing prescriptions for Symbicort and Spiriva Respimat. Rxs have been sent in. Nothing further was needed.

## 2017-11-22 NOTE — Progress Notes (Signed)
History of Present Illness Marco Morris is a 62 y.o. male  former smoker ( Quit 1979, 14 pack year smoking history)with COPD Gold B with a ? Asthma component, a fib and HTN seen by Dr. Vaughan Browner for Dyspnea.  HPI  Pt.  was initially started on Symbicort by Dr. Vaughan Browner, after being diagnosed with COPD Gold B, however he was using his rescue inhaler multiple times a day for breakthrough shortness of breath. He is very active, and runs   3 miles on a treadmill 5 days a week. We switched him to Trelegy, and he initially did very well, called and asked for a prescription, which was sent in. He presented after about 1 month  stating that  he has been compliant with his Trelegy every day. He uses his rescue approximately once daily before he runs.  However, he  complained that the Trelegy makes him short of breath as soon as he uses it. He states this lasts about 2-3 minutes, and then goes away.He states he is following the directions for use as printed on the label in regard to his technique.He states this had just started. He was unsure isf anything else  else has changed. He wondered if it due to the change in temperature, as he notices a worsening of dyspnea when going form the [facility] to the heat outside. He denies any worsening of his COPD symptoms, no increase in secretions , change in color of secretions. No increase in wheezing .He denies fever, chest pain, orthopnea or hemoptysis. He stated he is compliant with his Prilosec and his Xyzol.  11/26/2017 Follow up Pt. Presents for 1 month follow up after therapy with Symbicort and Spiriva.. He states he has been using his Symbicort and Spiriva daily. (Pt.initially did well on Trelegy  but stated it made him short of breath x 2 -3 minutes after use, and opted to stop and resume Symbicort .) We added Spiriva to his regimen last visit and her states he is doing well.He states this is the " [employer] him. He is able to work out  Without shortness of breath.  He is only using his rescue inhaler before he works out, and he has not had to use it for any further breakthrough.He states he has less of a productive cough. Secretions are white to light yellow. He is on Lisinopril for BP. We discussed that this can cause cough in some patients. He is going to talk with his PCP about the possibility of changing to an ARB.He denies fever, chest pain, orthopnea or hemoptysis.  Test Results: CXR 10/29/2017>>(personally reviewed by me)  Clear lungs , no active cardiopulmonary disease  FENO:>> 05/2017>> 38 ppb Alpha 1>> 02/2017>> 136 PI*MM  PFTs 02/05/17  FVC 4.46 (89%], FEV1 3.54 (94%), F/F 79, TLC 104%, DLCO 80% Minimal obstruction and diffusion defect with significant bronchodilator response. Mild air trapping.  CT chest 04/26/01-no lung mass, infiltrate, adenopathy. Early emphysematous changes. CT abdomen pelvis 03/03/13-lung images are clear. Chest x-ray 01/24/17-mild hyperinflation.    CBC Latest Ref Rng & Units 02/28/2017 10/02/2013 07/16/2013  WBC 4.0 - 10.5 K/uL 8.2 13.6(H) 7.3  Hemoglobin 13.0 - 17.0 g/dL 15.3 14.6 14.1  Hematocrit 39.0 - 52.0 % 45.7 43.9 43.0  Platelets 150.0 - 400.0 K/uL 194.0 229.0 175.0    BMP Latest Ref Rng & Units 10/17/2013 10/10/2013 10/02/2013  Glucose 70 - 99 mg/dL 104(H) 112(H) 92  BUN 6 - 23 mg/dL 11 15 15   Creatinine 0.4 -  1.5 mg/dL 1.2 1.29 1.5  Sodium 135 - 145 mEq/L 137 139 138  Potassium 3.5 - 5.1 mEq/L 3.8 4.4 3.9  Chloride 96 - 112 mEq/L 103 101 99  CO2 19 - 32 mEq/L 26 27 30   Calcium 8.4 - 10.5 mg/dL 9.6 9.4 9.6    BNP No results found for: BNP  ProBNP    Component Value Date/Time   PROBNP 56.2 06/05/2013 0850    PFT    Component Value Date/Time   FEV1PRE 2.99 02/05/2017 0857   FEV1POST 3.54 02/05/2017 0857   FVCPRE 3.85 02/05/2017 0857   FVCPOST 4.46 02/05/2017 0857   TLC 7.77 02/05/2017 0857   DLCOUNC 28.16 02/05/2017 0857   PREFEV1FVCRT 78 02/05/2017 0857   PSTFEV1FVCRT 79 02/05/2017 0857      Dg Chest 2 View  Result Date: 10/29/2017 CLINICAL DATA:  Dyspnea 1 month EXAM: CHEST - 2 VIEW COMPARISON:  01/24/2017 FINDINGS: Heart size within normal limits. Pulmonary vascularity normal. Cardiac loop recorder unchanged. Lungs are clear without infiltrate effusion or mass. Negative skeletal structures. IMPRESSION: No active cardiopulmonary disease. Electronically Signed   By: Franchot Gallo M.D.   On: 10/29/2017 12:14     Past medical hx Past Medical History:  Diagnosis Date  . Blood dyscrasia    high risk for blood clot formation  . Chronic kidney disease   . History of renal calculi   . Hypercholesterolemia   . Hypertension   . Hypothyroidism   . Neuromuscular disorder (HCC)    numbness left foot  . PAF (paroxysmal atrial fibrillation) (HCC)    a.  anticoagulated with Xarelto b. s/p Tikosyn loading 07/2013 c. s/p PVI  . Tachycardia-bradycardia syndrome ([place])    a. post termination pauses b. s/p STJ leadless pacemaker     Social History   Tobacco Use  . Smoking status: Former Smoker    Packs/day: 1.00    Years: 14.00    Pack years: 14.00    Types: Cigarettes    Last attempt to quit: 05/15/1977    Years since quitting: 40.5  . Smokeless tobacco: Former Systems developer    Types: [place] date: 01/13/2013  Substance Use Topics  . Alcohol use: Yes    Alcohol/week: 0.6 oz    Types: 1 Cans of beer per week  . Drug use: No    Mr.Blatchford reports that he quit smoking about 40 years ago. His smoking use included cigarettes. He has a 14.00 pack-year smoking history. He quit smokeless tobacco use about 4 years ago. His smokeless tobacco use included chew. He reports that he drinks about 0.6 oz of alcohol per week. He reports that he does not use drugs.  Tobacco Cessation: Former smoker , quit 1979 with a 14 pack year smoking history   Past surgical hx, Family hx, Social hx all reviewed.  Current Outpatient Medications on File Prior to Visit  Medication Sig  . albuterol (PROAIR  HFA) 108 (90 Base) MCG/ACT inhaler Inhale 2 puffs into the lungs every 4 (four) hours as needed for wheezing or shortness of breath.  Marland Kitchen aspirin 81 MG tablet Take 81 mg by mouth daily.  Marland Kitchen atorvastatin (LIPITOR) 20 MG tablet Take 20 mg by mouth every evening.   . budesonide-formoterol (SYMBICORT) 160-4.5 MCG/ACT inhaler Inhale 2 puffs into the lungs 2 (two) times daily.  Marland Kitchen levocetirizine (XYZAL) 5 MG tablet Take 5 mg by mouth every evening.  Marland Kitchen levothyroxine (SYNTHROID, LEVOTHROID) 125 MCG tablet Take 125 mcg by mouth  daily before breakfast.   . lisinopril (PRINIVIL,ZESTRIL) 10 MG tablet TAKE 1 TABLET (10 MG TOTAL) BY MOUTH DAILY.  . Multiple Vitamins-Minerals (CENTRUM SILVER 50+MEN) TABS Take 1 tablet by mouth daily.  Marland Kitchen omeprazole (PRILOSEC) 20 MG capsule Take 1 capsule (20 mg total) by mouth daily.  . potassium chloride SA (KLOR-CON M20) 20 MEQ tablet Klor-Con M20 mEq tablet,extended release  . Tiotropium Bromide Monohydrate (SPIRIVA RESPIMAT) 2.5 MCG/ACT AERS Inhale 2 puffs into the lungs daily.   No current facility-administered medications on file prior to visit.      Allergies  Allergen Reactions  . Codeine Nausea And Vomiting    Review Of Systems:  Constitutional:   No  weight loss, night sweats,  Fevers, chills, fatigue, or  lassitude.  HEENT:   No headaches,  Difficulty swallowing,  Tooth/dental problems, or  Sore throat,                No sneezing, itching, ear ache, nasal congestion, post nasal drip,   CV:  No chest pain,  Orthopnea, PND, swelling in lower extremities, anasarca, dizziness, palpitations, syncope.   GI  No heartburn, indigestion, abdominal pain, nausea, vomiting, diarrhea, change in bowel habits, loss of appetite, bloody stools.   Resp: No shortness of breath with exertion or at rest.  No excess mucus, no productive cough,  No non-productive cough,  No coughing up of blood.  No change in color of mucus.  No wheezing.  No chest wall deformity  Skin: no rash or  lesions.  GU: no dysuria, change in color of urine, no urgency or frequency.  No flank pain, no hematuria   MS:  No joint pain or swelling.  No decreased range of motion.  No back pain.  Psych:  No change in mood or affect. No depression or anxiety.  No memory loss.   Vital Signs BP 134/80 (BP Location: Left Arm, Cuff Size: Normal)   Pulse 71   Ht 5\' 11"  (1.803 m)   Wt 193 lb 9.6 oz (87.8 kg)   SpO2 95%   BMI 27.00 kg/m    Physical Exam:  General- No distress,  A&Ox3, pleasant ENT: No sinus tenderness, TM clear, pale nasal mucosa, no oral exudate,no post nasal drip, no LAN Cardiac: S1, S2, regular rate and rhythm, no murmur Chest: No wheeze/ rales/ dullness; no accessory muscle use, no nasal flaring, no sternal retractions Abd.: Soft Non-tender, ND, BS +, Body mass index is 27 kg/m. Ext: No clubbing cyanosis, edema Neuro:  normal strength, MAE x 4, A&O x 3 Skin: No rashes, warm and dry Psych: normal mood and behavior   Assessment/Plan  COPD, group B, by GOLD 2017 classification ([place]) Doing well on Symbicort/Spiriva Compliant Rare use of rescue inhaler Plan: Continue your Symbicort 2 puffs twice daily. Rinse mouth after use Continue Spiriva 2 puffs once daily Pro Air as needed. Xyzol 5 mg daily for allergy suppression Continue Protonix Talk with your PCP about changing your Lisinopril to another BP controller. Follow up in 6 months with Judson Roch NP or Dr. Vaughan Browner. Please contact office for sooner follow up if symptoms do not improve or worsen or seek emergency care       Magdalen Spatz, NP 11/26/2017  4:30 PM

## 2017-11-26 ENCOUNTER — Encounter: Payer: Self-pay | Admitting: Acute Care

## 2017-11-26 ENCOUNTER — Emergency Department (HOSPITAL_COMMUNITY)
Admission: EM | Admit: 2017-11-26 | Discharge: 2017-11-27 | Disposition: A | Discharge status: Home / Self Care | Payer: Managed Care, Other (non HMO) | Attending: Emergency Medicine | Admitting: Emergency Medicine

## 2017-11-26 ENCOUNTER — Encounter (HOSPITAL_COMMUNITY): Payer: Self-pay | Admitting: Emergency Medicine

## 2017-11-26 ENCOUNTER — Other Ambulatory Visit: Payer: Self-pay

## 2017-11-26 ENCOUNTER — Ambulatory Visit: Payer: Managed Care, Other (non HMO) | Admitting: Acute Care

## 2017-11-26 DIAGNOSIS — I129 Hypertensive chronic kidney disease with stage 1 through stage 4 chronic kidney disease, or unspecified chronic kidney disease: Secondary | ICD-10-CM | POA: Diagnosis not present

## 2017-11-26 DIAGNOSIS — Z950 Presence of cardiac pacemaker: Secondary | ICD-10-CM | POA: Diagnosis not present

## 2017-11-26 DIAGNOSIS — Z7982 Long term (current) use of aspirin: Secondary | ICD-10-CM | POA: Insufficient documentation

## 2017-11-26 DIAGNOSIS — Z87891 Personal history of nicotine dependence: Secondary | ICD-10-CM | POA: Diagnosis not present

## 2017-11-26 DIAGNOSIS — Z79899 Other long term (current) drug therapy: Secondary | ICD-10-CM | POA: Insufficient documentation

## 2017-11-26 DIAGNOSIS — N189 Chronic kidney disease, unspecified: Secondary | ICD-10-CM | POA: Diagnosis not present

## 2017-11-26 DIAGNOSIS — J449 Chronic obstructive pulmonary disease, unspecified: Secondary | ICD-10-CM | POA: Diagnosis not present

## 2017-11-26 DIAGNOSIS — R1031 Right lower quadrant pain: Secondary | ICD-10-CM

## 2017-11-26 DIAGNOSIS — R112 Nausea with vomiting, unspecified: Secondary | ICD-10-CM | POA: Diagnosis not present

## 2017-11-26 DIAGNOSIS — E039 Hypothyroidism, unspecified: Secondary | ICD-10-CM | POA: Diagnosis not present

## 2017-11-26 LAB — COMPREHENSIVE METABOLIC PANEL
ALT: 33 U/L (ref 0–44)
AST: 31 U/L (ref 15–41)
Albumin: 4.3 g/dL (ref 3.5–5.0)
Alkaline Phosphatase: 65 U/L (ref 38–126)
Anion gap: 10 (ref 5–15)
BUN: 15 mg/dL (ref 8–23)
CO2: 25 mmol/L (ref 22–32)
Calcium: 9.7 mg/dL (ref 8.9–10.3)
Chloride: 105 mmol/L (ref 98–111)
Creatinine, Ser: 1.34 mg/dL — ABNORMAL HIGH (ref 0.61–1.24)
GFR, EST NON AFRICAN AMERICAN: 54 mL/min — AB (ref >60)
GLUCOSE: 159 mg/dL — AB (ref 70–99)
POTASSIUM: 4.2 mmol/L (ref 3.5–5.1)
Sodium: 140 mmol/L (ref 135–145)
TOTAL PROTEIN: 6.9 g/dL (ref 6.5–8.1)
Total Bilirubin: 0.9 mg/dL (ref 0.3–1.2)

## 2017-11-26 LAB — URINALYSIS, ROUTINE W REFLEX MICROSCOPIC
BILIRUBIN URINE: NEGATIVE
GLUCOSE, UA: NEGATIVE mg/dL
HGB URINE DIPSTICK: NEGATIVE
KETONES UR: NEGATIVE mg/dL
Leukocytes, UA: NEGATIVE
Nitrite: NEGATIVE
PH: 6 (ref 5.0–8.0)
Protein, ur: NEGATIVE mg/dL
Specific Gravity, Urine: 1.017 (ref 1.005–1.030)

## 2017-11-26 LAB — CBC
HEMATOCRIT: 44.4 % (ref 39.0–52.0)
Hemoglobin: 14.8 g/dL (ref 13.0–17.0)
MCH: 29.1 pg (ref 26.0–34.0)
MCHC: 33.3 g/dL (ref 30.0–36.0)
MCV: 87.2 fL (ref 78.0–100.0)
Platelets: 172 10*3/uL (ref 150–400)
RBC: 5.09 MIL/uL (ref 4.22–5.81)
RDW: 13.9 % (ref 11.5–15.5)
WBC: 12.9 10*3/uL — ABNORMAL HIGH (ref 4.0–10.5)

## 2017-11-26 LAB — LIPASE, BLOOD: LIPASE: 28 U/L (ref 11–51)

## 2017-11-26 MED ORDER — ONDANSETRON 4 MG PO TBDP
4.0000 mg | ORAL_TABLET | Freq: Once | ORAL | Status: AC | PRN
  Administered 2017-11-26: 4 mg via ORAL
  Filled 2017-11-26: qty 1

## 2017-11-26 NOTE — ED Triage Notes (Signed)
Pt reports lower right quad pain that started slowly and has intensified to a constant sharp pain.  Pt emesis X3.

## 2017-11-26 NOTE — Patient Instructions (Addendum)
It is good to see you today. Continue your Symbicort 2 puffs twice daily. Rinse mouth after use Continue Spiriva 2 puffs once daily Pro Air as needed. Xyzal 5 mg daily for allergy suppression Continue Protonix Talk with your PCP about changing your Lisinopril to another BP controller. Follow up in 6 months with Judson Roch NP or Dr. Vaughan Browner. Please contact office for sooner follow up if symptoms do not improve or worsen or seek emergency care

## 2017-11-26 NOTE — Assessment & Plan Note (Signed)
Doing well on Symbicort/Spiriva Compliant Rare use of rescue inhaler Plan: Continue your Symbicort 2 puffs twice daily. Rinse mouth after use Continue Spiriva 2 puffs once daily Pro Air as needed. Xyzol 5 mg daily for allergy suppression Continue Protonix Talk with your PCP about changing your Lisinopril to another BP controller. Follow up in 6 months with Judson Roch NP or Dr. Vaughan Browner. Please contact office for sooner follow up if symptoms do not improve or worsen or seek emergency care

## 2017-11-27 ENCOUNTER — Emergency Department (HOSPITAL_COMMUNITY): Payer: Managed Care, Other (non HMO)

## 2017-11-27 LAB — CBC WITH DIFFERENTIAL/PLATELET
ABS IMMATURE GRANULOCYTES: 0.1 10*3/uL (ref 0.0–0.1)
Basophils Absolute: 0 10*3/uL (ref 0.0–0.1)
Basophils Relative: 0 %
Eosinophils Absolute: 0.1 10*3/uL (ref 0.0–0.7)
Eosinophils Relative: 1 %
HEMATOCRIT: 42 % (ref 39.0–52.0)
Hemoglobin: 14 g/dL (ref 13.0–17.0)
IMMATURE GRANULOCYTES: 0 %
LYMPHS ABS: 1.3 10*3/uL (ref 0.7–4.0)
LYMPHS PCT: 10 %
MCH: 29.2 pg (ref 26.0–34.0)
MCHC: 33.3 g/dL (ref 30.0–36.0)
MCV: 87.7 fL (ref 78.0–100.0)
MONOS PCT: 8 %
Monocytes Absolute: 1 10*3/uL (ref 0.1–1.0)
NEUTROS ABS: 10.3 10*3/uL — AB (ref 1.7–7.7)
NEUTROS PCT: 81 %
Platelets: 151 10*3/uL (ref 150–400)
RBC: 4.79 MIL/uL (ref 4.22–5.81)
RDW: 14.2 % (ref 11.5–15.5)
WBC: 12.8 10*3/uL — ABNORMAL HIGH (ref 4.0–10.5)

## 2017-11-27 MED ORDER — ONDANSETRON 4 MG PO TBDP: 4.0000 mg | ORAL_TABLET | Freq: Three times a day (TID) | ORAL | 0 refills | Status: DC | PRN

## 2017-11-27 MED ORDER — ONDANSETRON HCL 4 MG/2ML IJ SOLN
4.0000 mg | Freq: Once | INTRAMUSCULAR | Status: DC
Start: 2017-11-27 — End: 2017-11-27

## 2017-11-27 MED ORDER — HYDROCODONE-ACETAMINOPHEN 5-325 MG PO TABS: 1.0000 | ORAL_TABLET | ORAL | 0 refills | Status: DC | PRN

## 2017-11-27 MED ORDER — MORPHINE SULFATE (PF) 4 MG/ML IV SOLN
4.0000 mg | Freq: Once | INTRAVENOUS | Status: AC
  Administered 2017-11-27: 4 mg via INTRAVENOUS
  Filled 2017-11-27: qty 1

## 2017-11-27 NOTE — Discharge Instructions (Addendum)
Your work-up has been reassuring.  Unknown cause your symptoms.  Your CAT scan did not show any cause of your pain at this time.  Have given you a short course of pain medication.  This medication will make you drowsy so be careful taking it.  Have given you Zofran for nausea.  Your kidney function is mildly elevated.  Drink plenty of fluids and have this followed up with the primary care doctor and recheck.  If you develop any fevers, worsening pain or vomiting return to ED.

## 2017-11-27 NOTE — ED Provider Notes (Signed)
[facility] EMERGENCY DEPARTMENT Provider Note   CSN: [id number] Arrival date & time: 11/26/17  1844     History   Chief Complaint Chief Complaint  Patient presents with  . Abdominal Pain  . Emesis    HPI Marco Morris is a 62 y.o. male.  HPI 62 year old Caucasian male past medical history significant for hypertension, chronic kidney disease, history of renal calculi that presents to the emergency department today for evaluation of right lower quadrant abdominal pain.  Onset this afternoon.  The pain has been constant.  Sharp in nature.  Nonradiating.  Patient thought that he did have a bowel movement and however he did this did not help his pain.  Denies any melena or hematochezia.  Denies any diarrhea.  Reports associated vomiting and nausea.  Denies urinary symptoms.  Denies testicular pain or swelling.  He has not taken anything for his pain prior to arrival.  Does report history of left atrophic kidney and kidney disease that is followed by urology and his primary care doctor.  Reports prior history of ureteral stones requiring intervention in the past.  Pt denies any fever, chill, ha, vision changes, lightheadedness, dizziness, congestion, neck pain, cp, sob, cough,, urinary symptoms, change in bowel habits, melena, hematochezia, lower extremity paresthesias.  Past Medical History:  Diagnosis Date  . Blood dyscrasia    high risk for blood clot formation  . Chronic kidney disease   . History of renal calculi   . Hypercholesterolemia   . Hypertension   . Hypothyroidism   . Neuromuscular disorder (HCC)    numbness left foot  . PAF (paroxysmal atrial fibrillation) (HCC)    a.  anticoagulated with Xarelto b. s/p Tikosyn loading 07/2013 c. s/p PVI  . Tachycardia-bradycardia syndrome ([place])    a. post termination pauses b. s/p STJ leadless pacemaker    Patient Active Problem List   Diagnosis Date Noted  . Mild asthma 10/29/2017  . COPD, group B, by GOLD  2017 classification ([place]) 06/04/2017  . Essential hypertension 08/24/2014  . Atrial fibrillation ([place]) 07/18/2013  . Right groin pain 07/18/2013  . Atrial flutter ([place]) 07/17/2013  . Tachycardia-bradycardia ([place]) 07/17/2013  . Nephrolithiasis 03/31/2013  . Unspecified hereditary and idiopathic peripheral neuropathy 01/30/2013  . Dizzy spells 10/27/2010  . Paroxysmal A-fib ([place]) 10/27/2010  . Hypercholesteremia 10/27/2010  . BILE DUCT STRICTURE 01/26/2009    Past Surgical History:  Procedure Laterality Date  . ATRIAL FIBRILLATION ABLATION [name] 10/09/2013   Procedure: ATRIAL FIBRILLATION ABLATION;  Surgeon: Coralyn Mark, MD;  Location: [facility] CATH LAB;  Service: Cardiovascular;  Laterality: [name];  . CHOLECYSTECTOMY  ~ 2000  . LITHOTRIPSY  2004; ~ 05/2013  . PERMANENT PACEMAKER INSERTION [name] 07/18/2013   STJ Leadless pacemaker implanted by Dr Rayann Heman for symptomatic pauses  . TEE WITHOUT CARDIOVERSION [name] 10/08/2013   Procedure: TRANSESOPHAGEAL ECHOCARDIOGRAM (TEE);  Surgeon: Sanda Klein, MD;  Location: [facility] ENDOSCOPY;  Service: Cardiovascular;  Laterality: [name];        Home Medications    Prior to Admission medications   Medication Sig Start Date End Date Taking? Authorizing Provider  albuterol (PROAIR HFA) 108 (90 Base) MCG/ACT inhaler Inhale 2 puffs into the lungs every 4 (four) hours as needed for wheezing or shortness of breath.    [provider]  aspirin 81 MG tablet Take 81 mg by mouth daily.    [provider]  atorvastatin (LIPITOR) 20 MG tablet Take 20 mg by mouth every evening.  [provider]  budesonide-formoterol (SYMBICORT) 160-4.5 MCG/ACT inhaler Inhale 2 puffs into the lungs 2 (two) times daily. 11/12/17   Mannam, Hart Robinsons, MD  HYDROcodone-acetaminophen (NORCO/VICODIN) 5-325 MG tablet Take 1-2 tablets by mouth every 4 (four) hours as needed. 11/27/17   Doristine Devoid, PA-C  levocetirizine (XYZAL) 5 MG tablet Take 5 mg by mouth every evening.     [provider]  levothyroxine (SYNTHROID, LEVOTHROID) 125 MCG tablet Take 125 mcg by mouth daily before breakfast.     [provider]  lisinopril (PRINIVIL,ZESTRIL) 10 MG tablet TAKE 1 TABLET (10 MG TOTAL) BY MOUTH DAILY. 03/06/14   [place]ique, Peter M, MD  Multiple Vitamins-Minerals (CENTRUM SILVER 50+MEN) TABS Take 1 tablet by mouth daily.    [provider]  omeprazole (PRILOSEC) 20 MG capsule Take 1 capsule (20 mg total) by mouth daily. 04/03/17   Chanetta Marshall K, NP  ondansetron (ZOFRAN ODT) 4 MG disintegrating tablet Take 1 tablet (4 mg total) by mouth every 8 (eight) hours as needed for nausea or vomiting. 11/27/17   Tiwanda Threats, Zack Seal, PA-C  potassium chloride SA (KLOR-CON M20) 20 MEQ tablet Klor-Con M20 mEq tablet,extended release    [provider]  Tiotropium Bromide Monohydrate (SPIRIVA RESPIMAT) 2.5 MCG/ACT AERS Inhale 2 puffs into the lungs daily. 11/12/17   Marshell Garfinkel, MD    Family History Family History  Problem Relation Age of Onset  . Heart attack Mother   . Stroke Mother   . Diabetes Mother   . Cancer - Ovarian Mother   . Leukemia Mother   . Heart attack Father   . Stroke Father   . Diabetes Father   . Cancer - Other Father   . Hypertension Brother     Social History Social History   Tobacco Use  . Smoking status: Former Smoker    Packs/day: 1.00    Years: 14.00    Pack years: 14.00    Types: Cigarettes    Last attempt to quit: 05/15/1977    Years since quitting: 40.5  . Smokeless tobacco: Former Systems developer    Types: [place] date: 01/13/2013  Substance Use Topics  . Alcohol use: Yes    Alcohol/week: 0.6 oz    Types: 1 Cans of beer per week  . Drug use: No     Allergies   Codeine   Review of Systems Review of Systems  All other systems reviewed and are negative.    Physical Exam Updated Vital Signs BP 117/76 (BP Location: Right Arm)   Pulse 85   Temp 98.7 F (37.1 C) (Oral)   Resp 16   Ht 5\' 11"  (1.803  m)   Wt 89.4 kg (197 lb)   SpO2 95%   BMI 27.48 kg/m   Physical Exam  Constitutional: He is oriented to person, place, and time. He appears well-developed and well-nourished.  Non-toxic appearance. No distress.  HENT:  Head: Normocephalic and atraumatic.  Mouth/Throat: Oropharynx is clear and moist.  Eyes: Pupils are equal, round, and reactive to light. Conjunctivae are normal. Right eye exhibits no discharge. Left eye exhibits no discharge.  Neck: Normal range of motion. Neck supple.  Abdominal: Soft. Normal appearance and bowel sounds are normal. He exhibits no distension. There is tenderness in the right lower quadrant. There is tenderness at McBurney's point. There is no rigidity, no rebound, no guarding, no CVA tenderness and negative Murphy's sign.  Musculoskeletal: Normal range of motion. He exhibits no tenderness.  Lymphadenopathy:  He has no cervical adenopathy.  Neurological: He is alert and oriented to person, place, and time.  Skin: Skin is warm and dry. Capillary refill takes less than 2 seconds. No rash noted.  Psychiatric: His behavior is normal. Judgment and thought content normal.  Nursing note and vitals reviewed.    ED Treatments / Results  Labs (all labs ordered are listed, but only abnormal results are displayed) Labs Reviewed  COMPREHENSIVE METABOLIC PANEL - Abnormal; Notable for the following components:      Result Value   Glucose, Bld 159 (*)    Creatinine, Ser 1.34 (*)    GFR calc non Af Amer 54 (*)    All other components within normal limits  CBC - Abnormal; Notable for the following components:   WBC 12.9 (*)    All other components within normal limits  CBC WITH DIFFERENTIAL/PLATELET - Abnormal; Notable for the following components:   WBC 12.8 (*)    Neutro Abs 10.3 (*)    All other components within normal limits  LIPASE, BLOOD  URINALYSIS, ROUTINE W REFLEX MICROSCOPIC    EKG None  Radiology Ct Renal Stone Study  Result Date:  11/27/2017 CLINICAL DATA:  62 year old male with right lower quadrant abdominal pain. History of kidney stones. EXAM: CT ABDOMEN AND PELVIS WITHOUT CONTRAST TECHNIQUE: Multidetector CT imaging of the abdomen and pelvis was performed following the standard protocol without IV contrast. COMPARISON:  CT of the abdomen pelvis dated 03/03/2013 and renal ultrasound dated 05/06/2015 FINDINGS: Evaluation of this exam is limited in the absence of intravenous contrast. Lower chest: The visualized lung bases are clear. Metallic cardiac device noted. No intra-abdominal free air or free fluid. Hepatobiliary: Diffuse fatty liver. No intrahepatic biliary ductal dilatation. A 2.2 x 3.4 cm low attenuating area in the left lobe of the liver (series 3, image 12) is not well characterized but appears relatively similar to the prior CT of 2014 and may represent an area of scarring. Cholecystectomy. Pancreas: Unremarkable. No pancreatic ductal dilatation or surrounding inflammatory changes. Spleen: Normal in size without focal abnormality. Adrenals/Urinary Tract: The adrenal glands are unremarkable. There is moderate left renal parenchyma atrophy and cortical thinning. Multiple nonobstructing left renal calculi measure up to 13 mm in the inferior pole. There is mild left renal pelvicaliectasis. No obstructing stone identified. This finding is similar to prior CT of 2014 and may be related to a degree of stricture or narrowing at the left ureteropelvic junction. There is a 5 mm nonobstructing right renal inferior pole calculus. No hydronephrosis. The visualized ureters and urinary bladder appear unremarkable. Stomach/Bowel: There are small scattered colonic diverticula without active inflammatory changes. There is no bowel obstruction or active inflammation. Normal appendix. Vascular/Lymphatic: There is mild aortoiliac atherosclerotic disease. The IVC is unremarkable. No portal venous gas. There is no adenopathy. Reproductive: The  prostate and seminal vesicles are grossly unremarkable. No pelvic mass. Other: None Musculoskeletal: No acute or significant osseous findings. IMPRESSION: 1. Colonic diverticulosis. No bowel obstruction or active inflammation. Normal appendix. 2. Moderate left renal atrophy. Mild left renal pelvicaliectasis similar to prior CT likely related to stricture at the left UPJ. Bilateral nonobstructing renal calculi noted similar to prior CT. No obstructing stone. 3. Fatty liver. 4.  Aortic Atherosclerosis (ICD10-I70.0). Electronically Signed   By: Anner Crete M.D.   On: 11/27/2017 03:25    Procedures Procedures (including critical care time)  Medications Ordered in ED Medications  ondansetron (ZOFRAN-ODT) disintegrating tablet 4 mg (4 mg Oral Given 11/26/17 1954)  morphine 4 MG/ML injection 4 mg (4 mg Intravenous Given 11/27/17 0341)     Initial Impression / Assessment and Plan / ED Course  I have reviewed the triage vital signs and the nursing notes.  Pertinent labs & imaging results that were available during my care of the patient were reviewed by me and considered in my medical decision making (see chart for details).     Since with right lower quadrant abdominal pain with associated nausea and vomiting.  Patient is nontoxic, nonseptic appearing, in no apparent distress.  Patient's pain and other symptoms adequately managed in emergency department.   Labs, imaging and vitals reviewed.  Mild leukocytosis.  Otherwise stable lab work.  Creatinine is stable per patient.  Normal lipase.  UA shows no signs of infection.  Patient does not meet the SIRS or Sepsis criteria.  On repeat exam patient does not have a surgical abdomin and there are no peritoneal signs.  No indication of appendicitis, bowel obstruction, bowel perforation, cholecystitis, diverticulitis.  Unknown etiology of patient's symptoms. Patient discharged home with symptomatic treatment and given strict instructions for follow-up with  their primary care physician.  I have also discussed reasons to return immediately to the ER.  Patient expresses understanding and agrees with plan.     Final Clinical Impressions(s) / ED Diagnoses   Final diagnoses:  Right lower quadrant abdominal pain  Nausea and vomiting, intractability of vomiting not specified, unspecified vomiting type    ED Discharge Orders        Ordered    HYDROcodone-acetaminophen (NORCO/VICODIN) 5-325 MG tablet  Every 4 hours PRN     11/27/17 0432    ondansetron (ZOFRAN ODT) 4 MG disintegrating tablet  Every 8 hours PRN     11/27/17 0432       Doristine Devoid, PA-C 53/29/92 [id number]    Delora Fuel, MD 34/19/62 [phone]

## 2017-11-27 NOTE — ED Provider Notes (Deleted)
62 year old male had onset about 3 PM of right lower quadrant pain without radiation.  There is associated nausea.  No fever or chills.  No urinary symptoms.  On exam, there is moderate tenderness in the right lower quadrant which is fairly well localized to McBurney's area.  There is no rebound or guarding.  Bowel sounds are present.  ED work-up shows mild leukocytosis, clean urine, unremarkable CT of abdomen and pelvis.  Will recheck CBC.  If not showing any increase in WBC, will send home with short-term follow-up with PCP or in the ED.     Delora Fuel, MD 76/19/50 [phone]

## 2018-01-21 NOTE — Progress Notes (Signed)
Electrophysiology Office Note Date: 01/23/2018  ID:  Marco Morris, DOB 02-06-1956, MRN [id number]  PCP: Jilda Panda, MD Primary Cardiologist: [place] Electrophysiologist: [place]  CC: Pacemaker follow-up  Marco Morris is a 62 y.o. male seen today for Dr Rayann Heman.  He presents today for routine electrophysiology followup.  Since last being seen in our clinic, the patient reports doing very well.  He denies chest pain, palpitations, dyspnea, PND, orthopnea, nausea, vomiting, dizziness, syncope, edema, weight gain, or early satiety.  Device History: STJ leadless PPM implanted 2015 for post termination pauses  Past Medical History:  Diagnosis Date  . Blood dyscrasia    high risk for blood clot formation  . Chronic kidney disease   . History of renal calculi   . Hypercholesterolemia   . Hypertension   . Hypothyroidism   . Neuromuscular disorder (HCC)    numbness left foot  . PAF (paroxysmal atrial fibrillation) (HCC)    a.  anticoagulated with Xarelto b. s/p Tikosyn loading 07/2013 c. s/p PVI  . Tachycardia-bradycardia syndrome ([place])    a. post termination pauses b. s/p STJ leadless pacemaker   Past Surgical History:  Procedure Laterality Date  . ATRIAL FIBRILLATION ABLATION [name] 10/09/2013   Procedure: ATRIAL FIBRILLATION ABLATION;  Surgeon: Coralyn Mark, MD;  Location: [facility] CATH LAB;  Service: Cardiovascular;  Laterality: [name];  . CHOLECYSTECTOMY  ~ 2000  . LITHOTRIPSY  2004; ~ 05/2013  . PERMANENT PACEMAKER INSERTION [name] 07/18/2013   STJ Leadless pacemaker implanted by Dr Rayann Heman for symptomatic pauses  . TEE WITHOUT CARDIOVERSION [name] 10/08/2013   Procedure: TRANSESOPHAGEAL ECHOCARDIOGRAM (TEE);  Surgeon: Sanda Klein, MD;  Location: [facility] ENDOSCOPY;  Service: Cardiovascular;  Laterality: [name];    Current Outpatient Medications  Medication Sig Dispense Refill  . albuterol (PROAIR HFA) 108 (90 Base) MCG/ACT inhaler Inhale 2 puffs into the lungs every 4 (four) hours as needed  for wheezing or shortness of breath.    Marland Kitchen aspirin 81 MG tablet Take 81 mg by mouth daily.    Marland Kitchen atorvastatin (LIPITOR) 20 MG tablet Take 20 mg by mouth every evening.     . budesonide-formoterol (SYMBICORT) 160-4.5 MCG/ACT inhaler Inhale 2 puffs into the lungs 2 (two) times daily. 1 Inhaler 5  . levocetirizine (XYZAL) 5 MG tablet Take 5 mg by mouth every evening.    Marland Kitchen levothyroxine (SYNTHROID, LEVOTHROID) 125 MCG tablet Take 125 mcg by mouth daily before breakfast.     . lisinopril (PRINIVIL,ZESTRIL) 10 MG tablet TAKE 1 TABLET (10 MG TOTAL) BY MOUTH DAILY. 30 tablet 0  . Multiple Vitamins-Minerals (CENTRUM SILVER 50+MEN) TABS Take 1 tablet by mouth daily.    Marland Kitchen omeprazole (PRILOSEC) 20 MG capsule Take 1 capsule (20 mg total) by mouth daily. 90 capsule 3  . potassium chloride SA (KLOR-CON M20) 20 MEQ tablet Klor-Con M20 mEq tablet,extended release    . Tiotropium Bromide Monohydrate (SPIRIVA RESPIMAT) 2.5 MCG/ACT AERS Inhale 2 puffs into the lungs daily. 1 Inhaler 5   No current facility-administered medications for this visit.     Allergies:   Codeine   Social History: Social History   Socioeconomic History  . Marital status: Married    Spouse name: Not on file  . Number of children: 2  . Years of education: Not on file  . Highest education level: Not on file  Occupational History  . Occupation: DRIVER    Employer: [employer]  Social Needs  . Financial resource strain: Not on file  .  Food insecurity:    Worry: Not on file    Inability: Not on file  . Transportation needs:    Medical: Not on file    Non-medical: Not on file  Tobacco Use  . Smoking status: Former Smoker    Packs/day: 1.00    Years: 14.00    Pack years: 14.00    Types: Cigarettes    Last attempt to quit: 05/15/1977    Years since quitting: 40.7  . Smokeless tobacco: Former Systems developer    Types: [place] date: 01/13/2013  Substance and Sexual Activity  . Alcohol use: Yes    Alcohol/week: 1.0 standard  drinks    Types: 1 Cans of beer per week  . Drug use: No  . Sexual activity: Not Currently  Lifestyle  . Physical activity:    Days per week: Not on file    Minutes per session: Not on file  . Stress: Not on file  Relationships  . Social connections:    Talks on phone: Not on file    Gets together: Not on file    Attends religious service: Not on file    Active member of club or organization: Not on file    Attends meetings of clubs or organizations: Not on file    Relationship status: Not on file  . Intimate partner violence:    Fear of current or ex partner: Not on file    Emotionally abused: Not on file    Physically abused: Not on file    Forced sexual activity: Not on file  Other Topics Concern  . 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    Family History: Family History  Problem Relation Age of Onset  . Heart attack Mother   . Stroke Mother   . Diabetes Mother   . Cancer - Ovarian Mother   . Leukemia Mother   . Heart attack Father   . Stroke Father   . Diabetes Father   . Cancer - Other Father   . Hypertension Brother      Review of Systems: All other systems reviewed and are otherwise negative except as noted above.   Physical Exam: VS:  BP 132/88   Pulse 66   Ht 5\' 11"  (1.803 m)   Wt 191 lb 1.9 oz (86.7 kg)   SpO2 98%   BMI 26.66 kg/m  , BMI Body mass index is 26.66 kg/m.  GEN- The patient is well appearing, alert and oriented x 3 today.   HEENT: normocephalic, atraumatic; sclera clear, conjunctiva pink; hearing intact; oropharynx clear; neck supple  Lungs- Clear to ausculation bilaterally, normal work of breathing.  No wheezes, rales, rhonchi Heart- Regular rate and rhythm, no murmurs, rubs or gallops  GI- soft, non-tender, non-distended, bowel sounds present  Extremities- no clubbing, cyanosis, or edema  MS- no significant deformity or atrophy Skin- warm and dry, no rash or lesion; PPM pocket well  healed Psych- euthymic mood, full affect Neuro- strength and sensation are intact  PPM Interrogation- reviewed in detail today,  See PACEART report  EKG:  EKG is not ordered today.  Recent Labs: 11/26/2017: ALT 33; BUN 15; Creatinine, Ser 1.34; Potassium 4.2; Sodium 140 11/27/2017: Hemoglobin 14.0; Platelets 151   Wt Readings from Last 3 Encounters:  01/23/18 191 lb 1.9 oz (86.7 kg)  11/26/17 197 lb (89.4 kg)  11/26/17 193 lb 9.6 oz (87.8 kg)  Assessment and Plan:  1.  Tachy/brady syndrome Normal PPM function See Claudia Desanctis Art report No changes today Leadless advisories again discussed  2.  Paroxysmal atrial fibrillation Maintaining SR post ablation without AAD therapy CHADS2VASC is 1 - continue ASA  3.  HTN Stable No change required today  4.  COPD Followed by pulmonary    Current medicines are reviewed at length with the patient today.   The patient does not have concerns regarding his medicines.  The following changes were made today:  none  Labs/ tests ordered today include: none Orders Placed This Encounter  Procedures  . CUP PACEART INCLINIC DEVICE CHECK     Disposition:   Follow up with me in 6 months for leadless pacemaker check      Signed, Chanetta Marshall, NP 01/23/2018 11:12 AM  [facility] [address] [place] [address] [phone] (office) [phone] (fax)

## 2018-01-23 ENCOUNTER — Encounter: Payer: Self-pay | Admitting: Nurse Practitioner

## 2018-01-23 ENCOUNTER — Ambulatory Visit: Payer: Managed Care, Other (non HMO) | Admitting: Nurse Practitioner

## 2018-01-23 VITALS — BP 132/88 | HR 66 | Ht 71.0 in | Wt 191.1 lb

## 2018-01-23 DIAGNOSIS — I495 Sick sinus syndrome: Secondary | ICD-10-CM

## 2018-01-23 DIAGNOSIS — J449 Chronic obstructive pulmonary disease, unspecified: Secondary | ICD-10-CM | POA: Diagnosis not present

## 2018-01-23 DIAGNOSIS — I10 Essential (primary) hypertension: Secondary | ICD-10-CM | POA: Diagnosis not present

## 2018-01-23 DIAGNOSIS — I480 Paroxysmal atrial fibrillation: Secondary | ICD-10-CM | POA: Diagnosis not present

## 2018-01-23 LAB — CUP PACEART INCLINIC DEVICE CHECK
Implantable Pulse Generator Implant Date: 20150306
MDC IDC SESS DTM: 20190911110822

## 2018-01-23 NOTE — Patient Instructions (Addendum)
Medication Instructions:   Your physician recommends that you continue on your current medications as directed. Please refer to the Current Medication list given to you today.    If you need a refill on your cardiac medications before your next appointment, please call your pharmacy.  Labwork: NONE ORDERED  TODAY    Testing/Procedures: NONE ORDERED  TODAY    Follow-Up:  Your physician wants you to follow-up in:  IN  6  MONTHS WITH  SEILER   You will receive a reminder letter in the mail two months in advance. If you don't receive a letter, please call our office to schedule the follow-up appointment.     Any Other Special Instructions Will Be Listed Below (If Applicable).                                                                                                                                                   

## 2018-04-13 ENCOUNTER — Other Ambulatory Visit: Payer: Self-pay | Admitting: Internal Medicine

## 2018-04-19 ENCOUNTER — Other Ambulatory Visit (HOSPITAL_COMMUNITY): Payer: Self-pay | Admitting: Urology

## 2018-04-19 DIAGNOSIS — N1330 Unspecified hydronephrosis: Secondary | ICD-10-CM

## 2018-04-24 ENCOUNTER — Other Ambulatory Visit: Payer: Self-pay

## 2018-04-24 ENCOUNTER — Ambulatory Visit (HOSPITAL_COMMUNITY)
Admission: RE | Admit: 2018-04-24 | Discharge: 2018-04-24 | Disposition: A | Discharge status: Home / Self Care | Payer: Managed Care, Other (non HMO) | Source: Ambulatory Visit | Attending: Urology | Admitting: Urology

## 2018-04-24 DIAGNOSIS — N1330 Unspecified hydronephrosis: Secondary | ICD-10-CM | POA: Diagnosis not present

## 2018-04-24 MED ORDER — TECHNETIUM TC 99M MERTIATIDE
5.1000 | Freq: Once | INTRAVENOUS | Status: AC
  Administered 2018-04-24: 5.1 via INTRAVENOUS

## 2018-04-24 MED ORDER — FUROSEMIDE 10 MG/ML IJ SOLN
43.0000 mg | Freq: Once | INTRAMUSCULAR | Status: AC
  Administered 2018-04-24: 43 mg via INTRAVENOUS

## 2018-04-24 MED ORDER — FUROSEMIDE 10 MG/ML IJ SOLN
INTRAMUSCULAR | Status: AC
  Filled 2018-04-24: qty 8

## 2018-04-24 MED ORDER — TIOTROPIUM BROMIDE MONOHYDRATE 2.5 MCG/ACT IN AERS: 2.0000 | INHALATION_SPRAY | Freq: Every day | RESPIRATORY_TRACT | 5 refills | Status: DC

## 2018-04-24 MED ORDER — BUDESONIDE-FORMOTEROL FUMARATE 160-4.5 MCG/ACT IN AERO: 2.0000 | INHALATION_SPRAY | Freq: Two times a day (BID) | RESPIRATORY_TRACT | 5 refills | Status: DC

## 2018-07-17 ENCOUNTER — Telehealth: Payer: Self-pay | Admitting: Internal Medicine

## 2018-07-17 ENCOUNTER — Encounter: Payer: Self-pay | Admitting: Internal Medicine

## 2018-07-17 ENCOUNTER — Ambulatory Visit (INDEPENDENT_AMBULATORY_CARE_PROVIDER_SITE_OTHER): Payer: Medicare Other | Admitting: Internal Medicine

## 2018-07-17 VITALS — BP 134/82 | HR 66 | Ht 73.0 in | Wt 196.4 lb

## 2018-07-17 DIAGNOSIS — R079 Chest pain, unspecified: Secondary | ICD-10-CM

## 2018-07-17 DIAGNOSIS — Z950 Presence of cardiac pacemaker: Secondary | ICD-10-CM | POA: Diagnosis not present

## 2018-07-17 DIAGNOSIS — J449 Chronic obstructive pulmonary disease, unspecified: Secondary | ICD-10-CM | POA: Diagnosis not present

## 2018-07-17 DIAGNOSIS — I480 Paroxysmal atrial fibrillation: Secondary | ICD-10-CM

## 2018-07-17 DIAGNOSIS — I495 Sick sinus syndrome: Secondary | ICD-10-CM | POA: Diagnosis not present

## 2018-07-17 DIAGNOSIS — I10 Essential (primary) hypertension: Secondary | ICD-10-CM | POA: Diagnosis not present

## 2018-07-17 DIAGNOSIS — R072 Precordial pain: Secondary | ICD-10-CM

## 2018-07-17 LAB — CUP PACEART INCLINIC DEVICE CHECK
Implantable Pulse Generator Implant Date: 20150306
MDC IDC SESS DTM: 20200304180003

## 2018-07-17 MED ORDER — ATORVASTATIN CALCIUM 20 MG PO TABS: 20.0000 mg | ORAL_TABLET | Freq: Every evening | ORAL | 3 refills | Status: DC

## 2018-07-17 NOTE — Patient Instructions (Addendum)
Medication Instructions:  Your physician recommends that you continue on your current medications as directed. Please refer to the Current Medication list given to you today.  Labwork: None ordered.  Testing/Procedures: Your physician has requested that you have en exercise stress myoview. For further information please visit [email or web address]. Please follow instruction sheet, as given.  Please schedule for exercise myoview   Follow-Up: Your physician wants you to follow-up in: 6 months with Ricky at the AFIB clinic.  You will receive a reminder letter in the mail two months in advance. If you don't receive a letter, please call our office to schedule the follow-up appointment.   Any Other Special Instructions Will Be Listed Below (If Applicable).  If you need a refill on your cardiac medications before your next appointment, please call your pharmacy.

## 2018-07-17 NOTE — Progress Notes (Signed)
PCP: Marco Panda, MD Primary Cardiologist: Marco Marco Morris Primary EP:  Marco Morris is a 63 y.o. male who presents today for routine electrophysiology followup.  Since last being seen in our clinic, the patient reports doing very well. He has not had any heart racing or palpitations. He does describe sharp, midsternal chest pain which lasts about 30-45 minutes and does not radiate and is not related to exertion. No other associated symptoms. Today, he denies symptoms of palpitations, shortness of breath,  lower extremity edema, dizziness, presyncope, or syncope.  The patient is otherwise without complaint today.   Past Medical History:  Diagnosis Date  . Blood dyscrasia    high risk for blood clot formation  . Chronic kidney disease   . History of renal calculi   . Hypercholesterolemia   . Hypertension   . Hypothyroidism   . Neuromuscular disorder (HCC)    numbness left foot  . PAF (paroxysmal atrial fibrillation) (HCC)    a.  anticoagulated with Xarelto b. s/p Tikosyn loading 07/2013 c. s/p PVI  . Tachycardia-bradycardia syndrome ([place])    a. post termination pauses b. s/p STJ leadless pacemaker   Past Surgical History:  Procedure Laterality Date  . ATRIAL FIBRILLATION ABLATION [name] 10/09/2013   Procedure: ATRIAL FIBRILLATION ABLATION;  Surgeon: Coralyn Mark, MD;  Location: [facility] CATH LAB;  Service: Cardiovascular;  Laterality: [name];  . CHOLECYSTECTOMY  ~ 2000  . LITHOTRIPSY  2004; ~ 05/2013  . PERMANENT PACEMAKER INSERTION [name] 07/18/2013   STJ Leadless pacemaker implanted by Marco Rayann Heman for symptomatic pauses  . TEE WITHOUT CARDIOVERSION [name] 10/08/2013   Procedure: TRANSESOPHAGEAL ECHOCARDIOGRAM (TEE);  Surgeon: Sanda Klein, MD;  Location: [facility] ENDOSCOPY;  Service: Cardiovascular;  Laterality: [name];    ROS- all systems are reviewed and negative except as per HPI above  Current Outpatient Medications  Medication Sig Dispense Refill  . albuterol (PROAIR HFA) 108 (90 Base)  MCG/ACT inhaler Inhale 2 puffs into the lungs every 4 (four) hours as needed for wheezing or shortness of breath.    Marland Kitchen aspirin 81 MG tablet Take 81 mg by mouth daily.    . budesonide-formoterol (SYMBICORT) 160-4.5 MCG/ACT inhaler Inhale 2 puffs into the lungs 2 (two) times daily. 1 Inhaler 5  . levocetirizine (XYZAL) 5 MG tablet Take 5 mg by mouth every evening.    Marland Kitchen levothyroxine (SYNTHROID, LEVOTHROID) 125 MCG tablet Take 125 mcg by mouth daily before breakfast.     . lisinopril (PRINIVIL,ZESTRIL) 10 MG tablet TAKE 1 TABLET (10 MG TOTAL) BY MOUTH DAILY. 30 tablet 0  . Multiple Vitamins-Minerals (CENTRUM SILVER 50+MEN) TABS Take 1 tablet by mouth daily.    Marland Kitchen omeprazole (PRILOSEC) 20 MG capsule TAKE 1 CAPSULE BY MOUTH EVERY DAY 90 capsule 3  . potassium chloride SA (KLOR-CON M20) 20 MEQ tablet Klor-Con M20 mEq tablet,extended release    . Tiotropium Bromide Monohydrate (SPIRIVA RESPIMAT) 2.5 MCG/ACT AERS Inhale 2 puffs into the lungs daily. 1 Inhaler 5  . atorvastatin (LIPITOR) 20 MG tablet Take 1 tablet (20 mg total) by mouth every evening. 90 tablet 3   No current facility-administered medications for this visit.     Physical Exam: Vitals:   07/17/18 1246  BP: 134/82  Pulse: 66  SpO2: 98%  Weight: 196 lb 6.4 oz (89.1 kg)  Height: 6\' 1"  (1.854 m)    GEN- The patient is well appearing, alert and oriented x 3 today.   Head- normocephalic, atraumatic Eyes-  Sclera  clear, conjunctiva pink Ears- hearing intact Oropharynx- clear Lungs- Clear to ausculation bilaterally, normal work of breathing Chest- pacemaker pocket is well healed Heart- Regular rate and rhythm, no murmurs, rubs or gallops, PMI not laterally displaced GI- soft, NT, ND, + BS Extremities- no clubbing, cyanosis, or edema  Pacemaker interrogation- reviewed in detail today,  See PACEART report  ekg tracing ordered today is personally reviewed and shows SR HR 66, LAD, PR 126, QRS 94, QTc 406  Assessment and Plan:  1.   Paroxysmal atrial fibrillation Resolved s/p ablation chads2vasc score is 1, will be 2 after his birthday this year.  On asa for now given low AF burden. Possible candidate for REACT-AF trial.  2. Post termination pauses Resolved s/p ablation.  Normal pacemaker function. See Paceart report. Battery life >25 years. No changes today.  3. HTN Stable, no changes today.  4. Chest Pain Has had 3-4 episodes of chest pain with both typical and atypical symptoms.  Will order treadmill stress test. Patient agrees to go to ER if significant chest pain persists.    Follow up in the Afib Clinic in 6 months.  Marco Grayer MD, [facility] 07/17/2018 2:28 PM

## 2018-07-17 NOTE — Telephone Encounter (Signed)
Refilled cholesterol medication as requested

## 2018-07-17 NOTE — Telephone Encounter (Signed)
[facility] mail order pharmacy is requesting a refill on Atorvastatin. Would Dr. Rayann Heman like to refill this medication? Please address

## 2018-07-24 ENCOUNTER — Encounter: Payer: Managed Care, Other (non HMO) | Admitting: Nurse Practitioner

## 2018-07-24 ENCOUNTER — Encounter (HOSPITAL_COMMUNITY): Payer: Medicare Other

## 2018-07-30 ENCOUNTER — Telehealth: Payer: Self-pay | Admitting: Acute Care

## 2018-07-30 ENCOUNTER — Telehealth (HOSPITAL_COMMUNITY): Payer: Self-pay

## 2018-07-30 NOTE — Telephone Encounter (Signed)
Have him come to the office and pick up samples of Advair 250/50 diskus. We can do a therapeutic trial on this  Before sending in a script. Tell him if he likes this, he needs to call us and let us know so we can  send in a prescription. If he does not like it we will try the [facility]. Thanks

## 2018-07-30 NOTE — Telephone Encounter (Signed)
Called and spoke with patient he stated that since he switched insurance companies they will no longer cover his symbicort 160. His options are a tier one category. The options are listed below.   Advair 100/50 diskus Advair HFA 115/21 MCG Advair 250/50 Diskus Advair 500/50 Diskus   Please advise, thank you SG

## 2018-07-30 NOTE — Telephone Encounter (Signed)
Patient contacted and detailed instructions were given. He stated that he would be here. S.Schuyler Behan EMTP

## 2018-07-30 NOTE — Telephone Encounter (Signed)
Due to the virus we have been asked that patients do not come in for samples. SG are you ok with me sending in a prescription for one month. Thank you.

## 2018-07-31 ENCOUNTER — Telehealth: Payer: Self-pay | Admitting: Acute Care

## 2018-07-31 MED ORDER — FLUTICASONE-SALMETEROL 250-50 MCG/DOSE IN AEPB: 1.0000 | INHALATION_SPRAY | Freq: Two times a day (BID) | RESPIRATORY_TRACT | 5 refills | Status: DC

## 2018-07-31 NOTE — Telephone Encounter (Signed)
Me       07/30/18 11:12 AM  Note    Due to the virus we have been asked that patients do not come in for samples. SG are you ok with me sending in a prescription for one month. Thank you.           07/30/18 10:44 AM  Magdalen Spatz, NP routed this conversation to [facility] Triage Dirk Dress, NP       07/30/18 10:44 AM  Note    Have him come to the office and pick up samples of Advair 250/50 diskus. We can do a therapeutic trial on this  Before sending in a script. Tell him if he likes this, he needs to call us and let us know so we can  send in a prescription. If he does not like it we will try the [facility]. Thanks     _______________________________________________  Hulen Skains and spoke with patient, SG please advise if we can send in a prescription of the [place]. Thank you.

## 2018-07-31 NOTE — Telephone Encounter (Signed)
That is fine 

## 2018-07-31 NOTE — Addendum Note (Signed)
Addended by: Della Goo C on: 07/31/2018 02:22 PM   Modules accepted: Orders

## 2018-07-31 NOTE — Telephone Encounter (Signed)
Called and spoke with patient he is aware and verbalized understanding. Order sent.

## 2018-08-01 ENCOUNTER — Ambulatory Visit (HOSPITAL_COMMUNITY): Payer: Medicare Other | Attending: Cardiology

## 2018-08-01 ENCOUNTER — Other Ambulatory Visit: Payer: Self-pay

## 2018-08-01 DIAGNOSIS — R072 Precordial pain: Secondary | ICD-10-CM | POA: Insufficient documentation

## 2018-08-01 LAB — MYOCARDIAL PERFUSION IMAGING
CHL CUP MPHR: 156 {beats}/min
CHL CUP NUCLEAR SDS: 0
CHL CUP NUCLEAR SRS: 0
Estimated workload: 10.1 METS
Exercise duration (min): 9 min
Exercise duration (sec): 0 s
LV sys vol: 23 mL
LVDIAVOL: 66 mL (ref 62–150)
NUC STRESS TID: 0.94
Peak HR: 141 {beats}/min
Percent HR: 90 %
Rest HR: 73 {beats}/min
SSS: 0

## 2018-08-01 MED ORDER — TECHNETIUM TC 99M TETROFOSMIN IV KIT
10.2000 | PACK | Freq: Once | INTRAVENOUS | Status: AC | PRN
  Administered 2018-08-01: 10.2 via INTRAVENOUS
  Filled 2018-08-01: qty 11

## 2018-08-01 MED ORDER — TECHNETIUM TC 99M TETROFOSMIN IV KIT
31.5000 | PACK | Freq: Once | INTRAVENOUS | Status: AC | PRN
  Administered 2018-08-01: 31.5 via INTRAVENOUS
  Filled 2018-08-01: qty 32

## 2018-08-02 ENCOUNTER — Telehealth: Payer: Self-pay | Admitting: Acute Care

## 2018-08-02 NOTE — Telephone Encounter (Signed)
[place], [phone] PA line.  Spoke with [employer].  [place] stated Patient did not need a PA for Advair 250/50.  Advair is on preferred list.   Called [facility] [phone], Pharmacy Tech stated prescription was sent in for [facility], but did not know if a PA was needed. Call was transferred to Pharmacist.  Left detailed message for Pharmacist to call back.

## 2018-08-07 ENCOUNTER — Telehealth: Payer: Self-pay | Admitting: Internal Medicine

## 2018-08-07 NOTE — Telephone Encounter (Signed)
New Message ° ° °Patient is calling to obtain stress test results. Please call to discuss.  °

## 2018-08-08 ENCOUNTER — Telehealth: Payer: Self-pay | Admitting: Acute Care

## 2018-08-08 NOTE — Telephone Encounter (Signed)
Called and spoke with patient he wanted to know which inhalers he needed to take. Advised patient of correct inhalers. Nothing further needed.

## 2018-08-08 NOTE — Telephone Encounter (Signed)
Returned call to Pt.   Advised of test results.  Pt was no longer on MyChart.  Deactivated Pt's old account and resent new invitation to [place].

## 2018-10-15 ENCOUNTER — Telehealth: Payer: Self-pay

## 2018-10-15 NOTE — Telephone Encounter (Signed)
Called and spoke with pt who stated he was needing to have a PA for Advair so it would be covered by [employer]. Asked pt if he had a phone number for [employer] so we could contact them to further get this taken care of for him and pt stated he did not have a phone number with him as he was not at home but would check at home to see if he could find a phone number that we needed to contact. Will await a return call from pt or his wife Opal Sidles with the phone number that we can contact to get med handled for pt.

## 2018-10-17 NOTE — Telephone Encounter (Signed)
Patient is returning phone call.  [employer] phone number is [phone].  Patient phone number is [phone].

## 2018-10-17 NOTE — Telephone Encounter (Signed)
Contacted [employer] and spoke with Traci.  Informed that [employer] prefers brand name on Advair Diskus 250/50 and no PA is needed.  The denial was due to generic brand rejection on this medication.  Contacted [facility] and asked to change prescription to brand name Alyse Low).  Prescription was converted to brand name and she ran the medication through and co-pay was $13.  Prescription should be correct now for use of [employer] insurance.  Contacted patient and informed him of this information. Patient acknowledged understanding.  States he can also fill this through mail in service.  Advised patient if he needs further assistance with refills or has other concerns to let us know.  Nothing further needed at this time.

## 2018-11-04 ENCOUNTER — Telehealth: Payer: Self-pay | Admitting: Acute Care

## 2018-11-04 NOTE — Telephone Encounter (Signed)
LMTCB

## 2018-11-05 MED ORDER — FLUTICASONE-SALMETEROL 250-50 MCG/DOSE IN AEPB: 1.0000 | INHALATION_SPRAY | Freq: Two times a day (BID) | RESPIRATORY_TRACT | 0 refills | Status: DC

## 2018-11-05 NOTE — Telephone Encounter (Signed)
Patient is returning phone call.  Patient phone number is [phone].

## 2018-11-05 NOTE — Telephone Encounter (Signed)
Attempted to call pt but unable to reach. Left message for pt to return call. 

## 2018-11-05 NOTE — Telephone Encounter (Addendum)
Spoke with pt, he is stating that he would like his Rx to be sent through the mail order for his prescription for Advair. He was unable to advise me of the mail order pharmacy that he uses. [facility] but was unable to verify that pharmacy. [facility] in his [facility]. I [facility] to see if received medications through them and they verified that he has had prescritions in the past. Nothing further Is needed.

## 2018-11-24 ENCOUNTER — Emergency Department (HOSPITAL_COMMUNITY): Payer: Medicare Other

## 2018-11-24 ENCOUNTER — Other Ambulatory Visit: Payer: Self-pay

## 2018-11-24 ENCOUNTER — Emergency Department (HOSPITAL_COMMUNITY)
Admission: EM | Admit: 2018-11-24 | Discharge: 2018-11-25 | Disposition: A | Discharge status: Home / Self Care | Payer: Medicare Other | Attending: Emergency Medicine | Admitting: Emergency Medicine

## 2018-11-24 ENCOUNTER — Encounter (HOSPITAL_COMMUNITY): Payer: Self-pay

## 2018-11-24 DIAGNOSIS — J441 Chronic obstructive pulmonary disease with (acute) exacerbation: Secondary | ICD-10-CM

## 2018-11-24 DIAGNOSIS — J449 Chronic obstructive pulmonary disease, unspecified: Secondary | ICD-10-CM | POA: Insufficient documentation

## 2018-11-24 DIAGNOSIS — Z20828 Contact with and (suspected) exposure to other viral communicable diseases: Secondary | ICD-10-CM | POA: Insufficient documentation

## 2018-11-24 DIAGNOSIS — I129 Hypertensive chronic kidney disease with stage 1 through stage 4 chronic kidney disease, or unspecified chronic kidney disease: Secondary | ICD-10-CM | POA: Insufficient documentation

## 2018-11-24 DIAGNOSIS — R05 Cough: Secondary | ICD-10-CM | POA: Diagnosis present

## 2018-11-24 DIAGNOSIS — Z87891 Personal history of nicotine dependence: Secondary | ICD-10-CM | POA: Diagnosis not present

## 2018-11-24 DIAGNOSIS — Z79899 Other long term (current) drug therapy: Secondary | ICD-10-CM | POA: Diagnosis not present

## 2018-11-24 DIAGNOSIS — N189 Chronic kidney disease, unspecified: Secondary | ICD-10-CM | POA: Diagnosis not present

## 2018-11-24 DIAGNOSIS — E039 Hypothyroidism, unspecified: Secondary | ICD-10-CM | POA: Diagnosis not present

## 2018-11-24 DIAGNOSIS — Z7982 Long term (current) use of aspirin: Secondary | ICD-10-CM | POA: Diagnosis not present

## 2018-11-24 LAB — CBC WITH DIFFERENTIAL/PLATELET
Abs Immature Granulocytes: 0.06 10*3/uL (ref 0.00–0.07)
Basophils Absolute: 0.1 10*3/uL (ref 0.0–0.1)
Basophils Relative: 0 %
Eosinophils Absolute: 0.2 10*3/uL (ref 0.0–0.5)
Eosinophils Relative: 1 %
HCT: 44.5 % (ref 39.0–52.0)
Hemoglobin: 15.2 g/dL (ref 13.0–17.0)
Immature Granulocytes: 0 %
Lymphocytes Relative: 5 %
Lymphs Abs: 0.7 10*3/uL (ref 0.7–4.0)
MCH: 30.3 pg (ref 26.0–34.0)
MCHC: 34.2 g/dL (ref 30.0–36.0)
MCV: 88.6 fL (ref 80.0–100.0)
Monocytes Absolute: 0.9 10*3/uL (ref 0.1–1.0)
Monocytes Relative: 5 %
Neutro Abs: 14.1 10*3/uL — ABNORMAL HIGH (ref 1.7–7.7)
Neutrophils Relative %: 89 %
Platelets: 196 10*3/uL (ref 150–400)
RBC: 5.02 MIL/uL (ref 4.22–5.81)
RDW: 13.4 % (ref 11.5–15.5)
WBC: 15.9 10*3/uL — ABNORMAL HIGH (ref 4.0–10.5)
nRBC: 0 % (ref 0.0–0.2)

## 2018-11-24 LAB — LACTIC ACID, PLASMA: Lactic Acid, Venous: 1.4 mmol/L (ref 0.5–1.9)

## 2018-11-24 LAB — COMPREHENSIVE METABOLIC PANEL
ALT: 32 U/L (ref 0–44)
AST: 30 U/L (ref 15–41)
Albumin: 4.6 g/dL (ref 3.5–5.0)
Alkaline Phosphatase: 77 U/L (ref 38–126)
Anion gap: 12 (ref 5–15)
BUN: 18 mg/dL (ref 8–23)
CO2: 25 mmol/L (ref 22–32)
Calcium: 10.2 mg/dL (ref 8.9–10.3)
Chloride: 102 mmol/L (ref 98–111)
Creatinine, Ser: 1.18 mg/dL (ref 0.61–1.24)
GFR calc Af Amer: >60 mL/min (ref >60)
GFR calc non Af Amer: >60 mL/min (ref >60)
Glucose, Bld: 132 mg/dL — ABNORMAL HIGH (ref 70–99)
Potassium: 3.7 mmol/L (ref 3.5–5.1)
Sodium: 139 mmol/L (ref 135–145)
Total Bilirubin: 0.8 mg/dL (ref 0.3–1.2)
Total Protein: 8 g/dL (ref 6.5–8.1)

## 2018-11-24 MED ORDER — SODIUM CHLORIDE 0.9 % IV BOLUS
1000.0000 mL | Freq: Once | INTRAVENOUS | Status: AC
  Administered 2018-11-24: 23:00:00 1000 mL via INTRAVENOUS

## 2018-11-24 MED ORDER — SODIUM CHLORIDE 0.9 % IV SOLN
INTRAVENOUS | Status: DC
  Administered 2018-11-24: 23:00:00 via INTRAVENOUS

## 2018-11-24 NOTE — ED Provider Notes (Signed)
[facility] DEPT Provider Note   CSN: [id number] Arrival date & time: 11/24/18  2036    History   Chief Complaint Chief Complaint  Patient presents with  . Cough    HPI Marco Morris is a 63 y.o. male.     HPI  Marco Morris is a 63 y.o. male, with a history of COPD, CKD, HTN, hypothyroidism, A. fib, pacemaker, presenting to the ED with persistent nonproductive cough for the last 3 weeks. Also has complaint of subjective fever over the last several days.  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 Denies shortness of breath, hemoptysis, chest pain, lower extremity swelling/pain, N/V/D, abdominal pain, back pain, or any other complaints.      Past Medical History:  Diagnosis Date  . Blood dyscrasia    high risk for blood clot formation  . Chronic kidney disease   . History of renal calculi   . Hypercholesterolemia   . Hypertension   . Hypothyroidism   . Neuromuscular disorder (HCC)    numbness left foot  . PAF (paroxysmal atrial fibrillation) (HCC)    a.  anticoagulated with Xarelto b. s/p Tikosyn loading 07/2013 c. s/p PVI  . Tachycardia-bradycardia syndrome ([place])    a. post termination pauses b. s/p STJ leadless pacemaker    Patient Active Problem List   Diagnosis Date Noted  . Mild asthma 10/29/2017  . COPD, group B, by GOLD 2017 classification ([place]) 06/04/2017  . Essential hypertension 08/24/2014  . Atrial fibrillation ([place]) 07/18/2013  . Right groin pain 07/18/2013  . Atrial flutter ([place]) 07/17/2013  . Tachycardia-bradycardia ([place]) 07/17/2013  . Nephrolithiasis 03/31/2013  . Unspecified hereditary and idiopathic peripheral neuropathy 01/30/2013  . Dizzy spells 10/27/2010  . Paroxysmal A-fib ([place]) 10/27/2010  . Hypercholesteremia 10/27/2010  . BILE DUCT STRICTURE 01/26/2009    Past Surgical History:  Procedure  Laterality Date  . ATRIAL FIBRILLATION ABLATION [name] 10/09/2013   Procedure: ATRIAL FIBRILLATION ABLATION;  Surgeon: Coralyn Mark, MD;  Location: [facility] CATH LAB;  Service: Cardiovascular;  Laterality: [name];  . CHOLECYSTECTOMY  ~ 2000  . LITHOTRIPSY  2004; ~ 05/2013  . PERMANENT PACEMAKER INSERTION [name] 07/18/2013   STJ Leadless pacemaker implanted by Dr Rayann Heman for symptomatic pauses  . TEE WITHOUT CARDIOVERSION [name] 10/08/2013   Procedure: TRANSESOPHAGEAL ECHOCARDIOGRAM (TEE);  Surgeon: Sanda Klein, MD;  Location[facility] ENDOSCOPY;  Service: Cardiovascular;  Laterality: [name];        Home Medications    Prior to Admission medications   Medication Sig Start Date End Date Taking? Authorizing Provider  albuterol (PROAIR HFA) 108 (90 Base) MCG/ACT inhaler Inhale 2 puffs into the lungs every 4 (four) hours as needed for wheezing or shortness of breath.   Yes [provider]  aspirin 81 MG tablet Take 81 mg by mouth daily.   Yes [provider]  atorvastatin (LIPITOR) 20 MG tablet Take 1 tablet (20 mg total) by mouth every evening. Patient taking differently: Take 20 mg by mouth daily after breakfast.  07/17/18  Yes Allred, Jeneen Rinks, MD  Fluticasone-Salmeterol (ADVAIR DISKUS) 250-50 MCG/DOSE AEPB Inhale 1 puff into the lungs 2 (two) times daily. 11/05/18  Yes Magdalen Spatz, NP  levocetirizine (XYZAL) 5 MG tablet Take 5 mg by mouth daily after breakfast.    Yes [provider]  levothyroxine (SYNTHROID, LEVOTHROID) 125 MCG tablet Take 125 mcg by mouth daily before  breakfast.    Yes [provider]  lisinopril (PRINIVIL,ZESTRIL) 10 MG tablet TAKE 1 TABLET (10 MG TOTAL) BY MOUTH DAILY. Patient taking differently: Take 10 mg by mouth daily after breakfast.  03/06/14  Yes Martinique, Peter M, MD  Multiple Vitamins-Minerals (CENTRUM SILVER 50+MEN) TABS Take 1 tablet by mouth daily after breakfast.    Yes [provider]  Multiple Vitamins-Minerals (PRESERVISION AREDS 2 PO) Take  1 tablet by mouth 2 (two) times a day.   Yes [provider]  omeprazole (PRILOSEC) 20 MG capsule TAKE 1 CAPSULE BY MOUTH EVERY DAY Patient taking differently: Take 20 mg by mouth daily after breakfast.  04/15/18  Yes Seiler, [employer] K, NP  potassium chloride SA (KLOR-CON M20) 20 MEQ tablet Take 20 mEq by mouth daily after breakfast.    Yes [provider]  budesonide-formoterol (SYMBICORT) 160-4.5 MCG/ACT inhaler Inhale 2 puffs into the lungs 2 (two) times daily. Patient not taking: Reported on 11/24/2018 04/24/18   Marshell Garfinkel, MD  Tiotropium Bromide Monohydrate (SPIRIVA RESPIMAT) 2.5 MCG/ACT AERS Inhale 2 puffs into the lungs daily. Patient not taking: Reported on 11/24/2018 04/24/18   Marshell Garfinkel, MD    Family History Family History  Problem Relation Age of Onset  . Heart attack Mother   . Stroke Mother   . Diabetes Mother   . Cancer - Ovarian Mother   . Leukemia Mother   . Heart attack Father   . Stroke Father   . Diabetes Father   . Cancer - Other Father   . Hypertension Brother     Social History Social History   Tobacco Use  . Smoking status: Former Smoker    Packs/day: 1.00    Years: 14.00    Pack years: 14.00    Types: Cigarettes    Quit date: 05/15/1977    Years since quitting: 41.5  . Smokeless tobacco: Former Systems developer    Types: [place] date: 01/13/2013  Substance Use Topics  . Alcohol use: Yes    Alcohol/week: 1.0 standard drinks    Types: 1 Cans of beer per week  . Drug use: No     Allergies   Codeine   Review of Systems Review of Systems  Constitutional: Positive for fever.  Respiratory: Positive for cough. Negative for shortness of breath.   Cardiovascular: Negative for chest pain, palpitations and leg swelling.  Gastrointestinal: Negative for abdominal pain, diarrhea, nausea and vomiting.  Musculoskeletal: Negative for back pain.  Neurological: Negative for dizziness, syncope, weakness, light-headedness, numbness and  headaches.  All other systems reviewed and are negative.    Physical Exam Updated Vital Signs BP (!) 159/89 (BP Location: Left Arm)   Pulse (!) 126   Temp 99.4 F (37.4 C) (Oral)   Resp 20   Ht 6\' 1"  (1.854 m)   Wt 87.1 kg   SpO2 96%   BMI 25.33 kg/m   Physical Exam Vitals signs and nursing note reviewed.  Constitutional:      General: He is not in acute distress.    Appearance: He is well-developed. He is not diaphoretic.  HENT:     Head: Normocephalic and atraumatic.     Mouth/Throat:     Mouth: Mucous membranes are moist.     Pharynx: Oropharynx is clear.  Eyes:     Conjunctiva/sclera: Conjunctivae normal.  Neck:     Musculoskeletal: Neck supple.  Cardiovascular:     Rate and Rhythm: Regular rhythm. Tachycardia present.  Pulses: Normal pulses.          Radial pulses are 2+ on the right side and 2+ on the left side.       Posterior tibial pulses are 2+ on the right side and 2+ on the left side.     Heart sounds: Normal heart sounds.     Comments: Tactile temperature in the extremities appropriate and equal bilaterally. Pulmonary:     Effort: Pulmonary effort is normal. No respiratory distress.     Breath sounds: Normal breath sounds.     Comments: No increased work of breathing.  Speaks in full sentences without difficulty.  No coughing during patient interview. Abdominal:     Palpations: Abdomen is soft.     Tenderness: There is no abdominal tenderness. There is no guarding.  Musculoskeletal:     Right lower leg: No edema.     Left lower leg: No edema.  Lymphadenopathy:     Cervical: No cervical adenopathy.  Skin:    General: Skin is warm and dry.  Neurological:     Mental Status: He is alert.  Psychiatric:        Mood and Affect: Mood and affect normal.        Speech: Speech normal.        Behavior: Behavior normal.      ED Treatments / Results  Labs (all labs ordered are listed, but only abnormal results are displayed) Labs Reviewed  CBC WITH  DIFFERENTIAL/PLATELET - Abnormal; Notable for the following components:      Result Value   WBC 15.9 (*)    Neutro Abs 14.1 (*)    All other components within normal limits  COMPREHENSIVE METABOLIC PANEL - Abnormal; Notable for the following components:   Glucose, Bld 132 (*)    All other components within normal limits  SARS CORONAVIRUS 2 (HOSPITAL ORDER, [facility])  CULTURE, BLOOD (ROUTINE X 2)  CULTURE, BLOOD (ROUTINE X 2)  LACTIC ACID, PLASMA  URINALYSIS, ROUTINE W REFLEX MICROSCOPIC    EKG EKG Interpretation  Date/Time:  Sunday November 24 2018 22:47:05 EDT Ventricular Rate:  120 PR Interval:    QRS Duration: 90 QT Interval:  307 QTC Calculation: 434 R Axis:   -46 Text Interpretation:  Sinus tachycardia LAD, consider left anterior fascicular block No acute changes Confirmed by Merrily Pew [phone]) on 11/25/2018 12:07:23 AM   Radiology Dg Chest Port 1 View  Result Date: 11/24/2018 CLINICAL DATA:  63 y/o  M; three weeks of dry cough. EXAM: PORTABLE CHEST 1 VIEW COMPARISON:  10/29/2017 chest radiograph. FINDINGS: Stable heart size and mediastinal contours are within normal limits. Stable position of pacing device. Both lungs are clear. The visualized skeletal structures are unremarkable. IMPRESSION: No active disease. Electronically Signed   By: Kristine Garbe M.D.   On: 11/24/2018 23:03    Procedures Procedures (including critical care time)  Medications Ordered in ED Medications  0.9 %  sodium chloride infusion ( Intravenous Stopped 11/24/18 2256)  sodium chloride 0.9 % bolus 1,000 mL (has no administration in time range)  albuterol (VENTOLIN HFA) 108 (90 Base) MCG/ACT inhaler 2 puff (has no administration in time range)  acetaminophen (TYLENOL) tablet 1,000 mg (has no administration in time range)  doxycycline (VIBRA-TABS) tablet 100 mg (has no administration in time range)  predniSONE (DELTASONE) tablet 60 mg (has no administration in  time range)  sodium chloride 0.9 % bolus 1,000 mL (1,000 mLs Intravenous New Bag/Given 11/24/18 2256)  Initial Impression / Assessment and Plan / ED Course  I have reviewed the triage vital signs and the nursing notes.  Pertinent labs & imaging results that were available during my care of the patient were reviewed by me and considered in my medical decision making (see chart for details).        Patient presents with persistent cough for the last 3 to 4 weeks. Patient is nontoxic appearing, afebrile, not tachypneic during my exam, not hypotensive, maintains adequate SPO2 on room air, and is in no apparent distress.  He is tachycardic. Lungs are clear on exam.  No increased work of breathing or coughing during assessment. No acute abnormality on chest x-ray.  He does have a leukocytosis.  No lactic acidosis.  COVID-19 negative.   Findings and plan of care discussed with Merrily Pew, MD. Dr. Dayna Barker personally evaluated and examined this patient. Dr. Dayna Barker also took over patient care at the end of my shift.  Vitals:   11/24/18 2120 11/24/18 2317 11/24/18 2337  BP: (!) 159/89 122/78 122/78  Pulse: (!) 126 (!) 116 (!) 115  Resp: 20  (!) 30  Temp: 99.4 F (37.4 C)    TempSrc: Oral    SpO2: 96% 93% 93%  Weight: 87.1 kg    Height: 6\' 1"  (1.854 m)       Final Clinical Impressions(s) / ED Diagnoses   Final diagnoses:  None    ED Discharge Orders    None       Layla Maw 11/25/18 0102    Mesner, Corene Cornea, MD 11/25/18 [phone]

## 2018-11-24 NOTE — ED Triage Notes (Signed)
Pt reports dry cough x 3 weeks. Hx of COPD. Unsure if he has had a fever. Denies N/V. Denies pain.

## 2018-11-25 ENCOUNTER — Encounter (HOSPITAL_COMMUNITY): Payer: Self-pay | Admitting: Emergency Medicine

## 2018-11-25 DIAGNOSIS — R05 Cough: Secondary | ICD-10-CM | POA: Diagnosis not present

## 2018-11-25 LAB — URINALYSIS, ROUTINE W REFLEX MICROSCOPIC
Bilirubin Urine: NEGATIVE
Glucose, UA: NEGATIVE mg/dL
Hgb urine dipstick: NEGATIVE
Ketones, ur: NEGATIVE mg/dL
Leukocytes,Ua: NEGATIVE
Nitrite: NEGATIVE
Protein, ur: NEGATIVE mg/dL
Specific Gravity, Urine: 1.012 (ref 1.005–1.030)
pH: 6 (ref 5.0–8.0)

## 2018-11-25 LAB — SARS CORONAVIRUS 2 BY RT PCR (HOSPITAL ORDER, PERFORMED IN ~~LOC~~ HOSPITAL LAB): SARS Coronavirus 2: NEGATIVE

## 2018-11-25 MED ORDER — DOXYCYCLINE HYCLATE 100 MG PO CAPS: 100.0000 mg | ORAL_CAPSULE | Freq: Two times a day (BID) | ORAL | 0 refills | Status: AC

## 2018-11-25 MED ORDER — PREDNISONE 20 MG PO TABS: ORAL_TABLET | ORAL | 0 refills | Status: DC

## 2018-11-25 MED ORDER — ACETAMINOPHEN 500 MG PO TABS
1000.0000 mg | ORAL_TABLET | Freq: Once | ORAL | Status: AC
  Administered 2018-11-25: 1000 mg via ORAL
  Filled 2018-11-25: qty 2

## 2018-11-25 MED ORDER — ALBUTEROL SULFATE HFA 108 (90 BASE) MCG/ACT IN AERS
2.0000 | INHALATION_SPRAY | Freq: Once | RESPIRATORY_TRACT | Status: AC
  Administered 2018-11-25: 2 via RESPIRATORY_TRACT
  Filled 2018-11-25: qty 6.7

## 2018-11-25 MED ORDER — DOXYCYCLINE HYCLATE 100 MG PO TABS
100.0000 mg | ORAL_TABLET | Freq: Once | ORAL | Status: AC
  Administered 2018-11-25: 01:00:00 100 mg via ORAL
  Filled 2018-11-25: qty 1

## 2018-11-25 MED ORDER — SODIUM CHLORIDE 0.9 % IV BOLUS
1000.0000 mL | Freq: Once | INTRAVENOUS | Status: AC
  Administered 2018-11-25: 02:00:00 1000 mL via INTRAVENOUS

## 2018-11-25 MED ORDER — PREDNISONE 20 MG PO TABS
60.0000 mg | ORAL_TABLET | Freq: Once | ORAL | Status: AC
  Administered 2018-11-25: 01:00:00 60 mg via ORAL
  Filled 2018-11-25: qty 3

## 2018-11-25 NOTE — ED Notes (Signed)
Pt ambulated independently with a sat of 95% on RA

## 2018-11-25 NOTE — Discharge Instructions (Signed)
I suspect you have an exacerbation of your COPD at this point so please take the medications as prescribed.  However you also are on a blood pressure medication, lisinopril, that can cause a chronic cough as well also consider changing this if these medications that I gave you today do not work.  Obviously follow-up with your physicians because there is the possibility of lung abnormality that may require CT scan or further work-up if this is not improving.

## 2018-11-25 NOTE — ED Provider Notes (Signed)
Medical screening examination/treatment/procedure(s) were conducted as a shared visit with non-physician practitioner(s) and myself.  I personally evaluated the patient during the encounter.  Patient has about 3 to 4 weeks of nonproductive cough and hoarse voice.  History of COPD and thinks that maybe the hoarse voice is from coughing so much.  They also recently changed his Symbicort to Advair about a month prior to this starting so that could be related as well.  My exam patient has diminished breath sounds are still persistently tachycardic and slightly tachypneic with oxygen 93 to 97% range.  Patient does not feel like he is short of breath but does feel like sometimes hard to take a deep breath. Physician assistant is very start fluids to try to address the heart rate but also that there is probably some component of elevated temperature being related to it so we will give some Tylenol as well.  Also given some antibiotics and some steroids along with an albuterol inhaler to help with his cough.  Was COVID is negative.  His x-ray is clear.  Also considered ACE inhibitor induced chronic cough versus possible lung neoplasm however feel like treat his bronchitis first would be the most appropriate.  Patient has significant improvement with albuterol his heart rate was 96 prior to discharge.  He ambulated without significant dyspnea or hypoxia.  Patient stable for discharge at this time with pulmonary follow-up.  EKG Interpretation  Date/Time:  Sunday November 24 2018 22:47:05 EDT Ventricular Rate:  120 PR Interval:    QRS Duration: 90 QT Interval:  307 QTC Calculation: 434 R Axis:   -46 Text Interpretation:  Sinus tachycardia LAD, consider left anterior fascicular block No acute changes Confirmed by Merrily Pew [phone]) on 11/25/2018 12:07:23 AM     Elliona Doddridge, Corene Cornea, MD 11/25/18 3710

## 2018-11-30 LAB — CULTURE, BLOOD (ROUTINE X 2)
Culture: NO GROWTH
Culture: NO GROWTH
Special Requests: ADEQUATE

## 2019-01-02 NOTE — Progress Notes (Signed)
Electrophysiology Office Note Date: 01/03/2019  ID:  Marco, Morris 1955/08/02, MRN [id number]  PCP: Marco Panda, MD Primary Cardiologist: Marco Morris Electrophysiologist: Marco Morris  CC: Pacemaker follow-up  Marco Morris is a 63 y.o. male seen today for Dr Marco Morris.  He presents today for routine electrophysiology followup.  Since last being seen in our clinic, the patient reports doing very well.  He denies chest pain, palpitations, dyspnea, PND, orthopnea, nausea, vomiting, dizziness, syncope, edema, weight gain, or early satiety.  Device History: STJ leadless PPM implanted 2015 for post termination pauses    Past Medical History:  Diagnosis Date  . Blood dyscrasia    high risk for blood clot formation  . Chronic kidney disease   . History of renal calculi   . Hypercholesterolemia   . Hypertension   . Hypothyroidism   . Neuromuscular disorder (HCC)    numbness left foot  . PAF (paroxysmal atrial fibrillation) (HCC)    a.  anticoagulated with Xarelto b. s/p Tikosyn loading 07/2013 c. s/p PVI  . Tachycardia-bradycardia syndrome ([place])    a. post termination pauses b. s/p STJ leadless pacemaker   Past Surgical History:  Procedure Laterality Date  . ATRIAL FIBRILLATION ABLATION [name] 10/09/2013   Procedure: ATRIAL FIBRILLATION ABLATION;  Surgeon: Marco Mark, MD;  Location: [facility] CATH LAB;  Service: Cardiovascular;  Laterality: [name];  . CHOLECYSTECTOMY  ~ 2000  . LITHOTRIPSY  2004; ~ 05/2013  . PERMANENT PACEMAKER INSERTION [name] 07/18/2013   STJ Leadless pacemaker implanted by Dr Marco Morris for symptomatic pauses  . TEE WITHOUT CARDIOVERSION [name] 10/08/2013   Procedure: TRANSESOPHAGEAL ECHOCARDIOGRAM (TEE);  Surgeon: Marco Klein, MD;  Location: [facility] ENDOSCOPY;  Service: Cardiovascular;  Laterality: [name];    Current Outpatient Medications  Medication Sig Dispense Refill  . albuterol (PROAIR HFA) 108 (90 Base) MCG/ACT inhaler Inhale 2 puffs into the lungs every 4 (four) hours as  needed for wheezing or shortness of breath.    Marland Kitchen aspirin 81 MG tablet Take 81 mg by mouth daily.    Marland Kitchen atorvastatin (LIPITOR) 20 MG tablet Take 1 tablet (20 mg total) by mouth every evening. 90 tablet 3  . Fluticasone-Salmeterol (ADVAIR DISKUS) 250-50 MCG/DOSE AEPB Inhale 1 puff into the lungs 2 (two) times daily. 180 each 0  . levocetirizine (XYZAL) 5 MG tablet Take 5 mg by mouth daily after breakfast.     . levothyroxine (SYNTHROID, LEVOTHROID) 125 MCG tablet Take 125 mcg by mouth daily before breakfast.     . lisinopril (PRINIVIL,ZESTRIL) 10 MG tablet TAKE 1 TABLET (10 MG TOTAL) BY MOUTH DAILY. 30 tablet 0  . Multiple Vitamins-Minerals (CENTRUM SILVER 50+MEN) TABS Take 1 tablet by mouth daily after breakfast.     . Multiple Vitamins-Minerals (PRESERVISION AREDS 2 PO) Take 1 tablet by mouth 2 (two) times a day.    Marland Kitchen omeprazole (PRILOSEC) 20 MG capsule TAKE 1 CAPSULE BY MOUTH EVERY DAY 90 capsule 3  . potassium chloride SA (KLOR-CON M20) 20 MEQ tablet Take 20 mEq by mouth daily after breakfast.     . Tiotropium Bromide Monohydrate (SPIRIVA RESPIMAT) 2.5 MCG/ACT AERS Inhale into the lungs daily. 2 puff once per day     No current facility-administered medications for this visit.     Allergies:   Codeine   Social History: Social History   Socioeconomic History  . Marital status: Married    Spouse name: Not on file  . Number of children: 2  . Years of education: Not on  file  . Highest education level: Not on file  Occupational History  . Occupation: DRIVER    Employer: [employer]  Social Needs  . Financial resource strain: Not on file  . Food insecurity    Worry: Not on file    Inability: Not on file  . Transportation needs    Medical: Not on file    Non-medical: Not on file  Tobacco Use  . Smoking status: Former Smoker    Packs/day: 1.00    Years: 14.00    Pack years: 14.00    Types: Cigarettes    Quit date: 05/15/1977    Years since quitting: 41.6  . Smokeless  tobacco: Former Systems developer    Types: [place] date: 01/13/2013  Substance and Sexual Activity  . Alcohol use: Yes    Alcohol/week: 1.0 standard drinks    Types: 1 Cans of beer per week  . Drug use: No  . Sexual activity: Not Currently  Lifestyle  . Physical activity    Days per week: Not on file    Minutes per session: Not on file  . Stress: Not on file  Relationships  . Social Herbalist on phone: Not on file    Gets together: Not on file    Attends religious service: Not on file    Active member of club or organization: Not on file    Attends meetings of clubs or organizations: Not on file    Relationship status: Not on file  . Intimate partner violence    Fear of current or ex partner: Not on file    Emotionally abused: Not on file    Physically abused: Not on file    Forced sexual activity: Not on file  Other Topics Concern  . 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    Family History: Family History  Problem Relation Age of Onset  . Heart attack Mother   . Stroke Mother   . Diabetes Mother   . Cancer - Ovarian Mother   . Leukemia Mother   . Heart attack Father   . Stroke Father   . Diabetes Father   . Cancer - Other Father   . Hypertension Brother      Review of Systems: All other systems reviewed and are otherwise negative except as noted above.   Physical Exam: VS:  BP 112/80   Pulse 87   Ht 6\' 1"  (1.854 m)   Wt 193 lb 6.4 oz (87.7 kg)   SpO2 97%   BMI 25.52 kg/m  , BMI Body mass index is 25.52 kg/m.  GEN- The patient is well appearing, alert and oriented x 3 today.   HEENT: normocephalic, atraumatic; sclera clear, conjunctiva pink; hearing intact; oropharynx clear; neck supple  Lungs- Clear to ausculation bilaterally, normal work of breathing.  No wheezes, rales, rhonchi Heart- Regular rate and rhythm, no murmurs, rubs or gallops  GI- soft, non-tender, non-distended, bowel sounds present   Extremities- no clubbing, cyanosis, or edema  MS- no significant deformity or atrophy Skin- warm and dry, no rash or lesion; PPM pocket well healed Psych- euthymic mood, full affect Neuro- strength and sensation are intact  PPM Interrogation- reviewed in detail today,  See PACEART report  EKG:  EKG is not ordered today.  Recent Labs: 11/24/2018: ALT 32; BUN 18; Creatinine, Ser 1.18; Hemoglobin 15.2; Platelets 196; Potassium 3.7; Sodium 139   Wt  Readings from Last 3 Encounters:  01/03/19 193 lb 6.4 oz (87.7 kg)  11/24/18 192 lb (87.1 kg)  08/01/18 196 lb (88.9 kg)     Other studies Reviewed: Additional studies/ records that were reviewed today include: Dr Jackalyn Lombard office notes  Assessment and Plan:  1.  Tachy/brady syndrome Normal PPM function See Pace Art report No changes today  2.  Paroxysmal atrial fibrillation Maintaining SR off AAD therapy CHADS2VASC is now 2 (age and HTN) - we discussed today.  After a shared decision making process, will stop ASA and start Xarelto 20mg  daily. Labs from 11/2018 reviewed. He would be interested in the REACT-AF trial.   3.  HTN Stable No change required today  4.  COPD Followed by pulmonary    Current medicines are reviewed at length with the patient today.   The patient does not have concerns regarding his medicines.  The following changes were made today:  none  Labs/ tests ordered today include: none No orders of the defined types were placed in this encounter.    Disposition:   Follow up with Dr Marco Morris in 6 months      Signed, Chanetta Marshall, NP 01/03/2019 12:03 PM  [facility] [address] [place]  [phone] (office) [phone] (fax)

## 2019-01-03 ENCOUNTER — Other Ambulatory Visit: Payer: Self-pay

## 2019-01-03 ENCOUNTER — Encounter: Payer: Medicare Other | Admitting: *Deleted

## 2019-01-03 ENCOUNTER — Encounter: Payer: Self-pay | Admitting: Nurse Practitioner

## 2019-01-03 ENCOUNTER — Ambulatory Visit (INDEPENDENT_AMBULATORY_CARE_PROVIDER_SITE_OTHER): Payer: Medicare Other | Admitting: Nurse Practitioner

## 2019-01-03 VITALS — BP 112/80 | HR 87 | Ht 73.0 in | Wt 193.4 lb

## 2019-01-03 DIAGNOSIS — Z006 Encounter for examination for normal comparison and control in clinical research program: Secondary | ICD-10-CM

## 2019-01-03 DIAGNOSIS — I495 Sick sinus syndrome: Secondary | ICD-10-CM | POA: Diagnosis not present

## 2019-01-03 DIAGNOSIS — J449 Chronic obstructive pulmonary disease, unspecified: Secondary | ICD-10-CM

## 2019-01-03 DIAGNOSIS — I10 Essential (primary) hypertension: Secondary | ICD-10-CM

## 2019-01-03 DIAGNOSIS — I480 Paroxysmal atrial fibrillation: Secondary | ICD-10-CM

## 2019-01-03 MED ORDER — RIVAROXABAN 20 MG PO TABS: 20.0000 mg | ORAL_TABLET | Freq: Every day | ORAL | 3 refills | Status: DC

## 2019-01-03 NOTE — Research (Signed)
Mr. Moretz was here for his 62 month LEADLESS II follow-up. No issues reported.

## 2019-01-03 NOTE — Patient Instructions (Addendum)
Medication Instructions: STOP Aspirin   START XARELTO 20 mg Daily  If you need a refill on your cardiac medications before your next appointment, please call your pharmacy.   Lab work: none If you have labs (blood work) drawn today and your tests are completely normal, you will receive your results only by: Marland Kitchen MyChart Message (if you have MyChart) OR . A paper copy in the mail If you have any lab test that is abnormal or we need to change your treatment, we will call you to review the results.  Testing/Procedures: none  Follow-Up: 6 month Dr Rayann Heman At [facility], you and your health needs are our priority.  As part of our continuing mission to provide you with exceptional heart care, we have created designated Provider Care Teams.  These Care Teams include your primary Cardiologist (physician) and Advanced Practice Providers (APPs -  Physician Assistants and Nurse Practitioners) who all work together to provide you with the care you need, when you need it. .   Any Other Special Instructions Will Be Listed Below (If Applicable).

## 2019-01-07 ENCOUNTER — Telehealth: Payer: Self-pay | Admitting: Nurse Practitioner

## 2019-01-07 NOTE — Telephone Encounter (Signed)
° ° °  Pt c/o medication issue:  1. Name of Medication: xarelto  2. How are you currently taking this medication (dosage and times per day)? As written  3. Are you having a reaction (difficulty breathing--STAT)? no  4. What is your medication issue? Patient wants to clarify instructions

## 2019-01-07 NOTE — Telephone Encounter (Signed)
I spoke to the patient and clarified his Xarelto medication.  He verbalized understanding.

## 2019-01-11 ENCOUNTER — Other Ambulatory Visit: Payer: Self-pay | Admitting: Acute Care

## 2019-01-22 ENCOUNTER — Encounter (HOSPITAL_COMMUNITY): Payer: Self-pay | Admitting: Physician Assistant

## 2019-01-22 ENCOUNTER — Telehealth: Payer: Self-pay

## 2019-01-22 ENCOUNTER — Encounter: Payer: Self-pay | Admitting: Acute Care

## 2019-01-22 ENCOUNTER — Other Ambulatory Visit: Payer: Self-pay

## 2019-01-22 ENCOUNTER — Ambulatory Visit (INDEPENDENT_AMBULATORY_CARE_PROVIDER_SITE_OTHER): Payer: Medicare Other | Admitting: Acute Care

## 2019-01-22 ENCOUNTER — Ambulatory Visit (HOSPITAL_COMMUNITY)
Admission: RE | Admit: 2019-01-22 | Discharge: 2019-01-22 | Disposition: A | Discharge status: Home / Self Care | Payer: Medicare Other | Source: Ambulatory Visit | Attending: Physician Assistant | Admitting: Physician Assistant

## 2019-01-22 VITALS — BP 128/78 | HR 82 | Ht 73.0 in | Wt 189.8 lb

## 2019-01-22 DIAGNOSIS — Z87891 Personal history of nicotine dependence: Secondary | ICD-10-CM | POA: Insufficient documentation

## 2019-01-22 DIAGNOSIS — I129 Hypertensive chronic kidney disease with stage 1 through stage 4 chronic kidney disease, or unspecified chronic kidney disease: Secondary | ICD-10-CM | POA: Diagnosis not present

## 2019-01-22 DIAGNOSIS — Z950 Presence of cardiac pacemaker: Secondary | ICD-10-CM | POA: Insufficient documentation

## 2019-01-22 DIAGNOSIS — E039 Hypothyroidism, unspecified: Secondary | ICD-10-CM | POA: Insufficient documentation

## 2019-01-22 DIAGNOSIS — Z79899 Other long term (current) drug therapy: Secondary | ICD-10-CM | POA: Insufficient documentation

## 2019-01-22 DIAGNOSIS — E7800 Pure hypercholesterolemia, unspecified: Secondary | ICD-10-CM | POA: Insufficient documentation

## 2019-01-22 DIAGNOSIS — J449 Chronic obstructive pulmonary disease, unspecified: Secondary | ICD-10-CM | POA: Diagnosis not present

## 2019-01-22 DIAGNOSIS — Z7901 Long term (current) use of anticoagulants: Secondary | ICD-10-CM | POA: Insufficient documentation

## 2019-01-22 DIAGNOSIS — N189 Chronic kidney disease, unspecified: Secondary | ICD-10-CM | POA: Insufficient documentation

## 2019-01-22 DIAGNOSIS — I480 Paroxysmal atrial fibrillation: Secondary | ICD-10-CM | POA: Diagnosis not present

## 2019-01-22 DIAGNOSIS — Z79890 Hormone replacement therapy (postmenopausal): Secondary | ICD-10-CM | POA: Insufficient documentation

## 2019-01-22 LAB — CBC
HCT: 41.2 % (ref 39.0–52.0)
Hemoglobin: 13.7 g/dL (ref 13.0–17.0)
MCH: 29.9 pg (ref 26.0–34.0)
MCHC: 33.3 g/dL (ref 30.0–36.0)
MCV: 90 fL (ref 80.0–100.0)
Platelets: 155 10*3/uL (ref 150–400)
RBC: 4.58 MIL/uL (ref 4.22–5.81)
RDW: 14.2 % (ref 11.5–15.5)
WBC: 6 10*3/uL (ref 4.0–10.5)
nRBC: 0 % (ref 0.0–0.2)

## 2019-01-22 LAB — BASIC METABOLIC PANEL
Anion gap: 10 (ref 5–15)
BUN: 14 mg/dL (ref 8–23)
CO2: 24 mmol/L (ref 22–32)
Calcium: 9.6 mg/dL (ref 8.9–10.3)
Chloride: 105 mmol/L (ref 98–111)
Creatinine, Ser: 1.12 mg/dL (ref 0.61–1.24)
GFR calc Af Amer: >60 mL/min (ref >60)
GFR calc non Af Amer: >60 mL/min (ref >60)
Glucose, Bld: 116 mg/dL — ABNORMAL HIGH (ref 70–99)
Potassium: 3.9 mmol/L (ref 3.5–5.1)
Sodium: 139 mmol/L (ref 135–145)

## 2019-01-22 NOTE — Patient Instructions (Addendum)
It is good to see you today. We will place a note in your chart specifying that you want to try the Advair HFA as the powdered makes you dizzy. I respect that you want to use the 90 day supply of the powdered that you already have.  We will order the Advair HFA when it is time to re-order your inhaler. Continue using Advair 2 puffs twice daily as you have been doing. Rinse mouth after use Continue using Spiriva 2 puffs once daily Rinse mouth after use Continue using your Pro Air inhaler as rescue for breakthrough wheezing or shortness of breath. If you notice increased use of rescue inhaler, call to be seen. 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 If you notice these symptoms, please call to be seen.   We will schedule PFT's in 6 months before follow up with Marco Morris. Follow up with Marco Morris in 6 months or before as needed. Please contact office for sooner follow up if symptoms do not improve or worsen or seek emergency care

## 2019-01-22 NOTE — Progress Notes (Signed)
Primary Care Physician: Jilda Panda, MD Primary Cardiologist: Dr Martinique Primary Electrophysiologist: Dr Rayann Heman Referring Physician: Dr Jeanmarie Plant is a 63 y.o. male with a history of HTN, CKD, hypothyroidism, tachy/brady syndrome s/p PPM, and paroxysmal atrial fibrillation who presents for follow up in the [facility]. Patient reports that he has done well with no heart racing episodes. He recently started Xarelto and denies any bleeding issues.   Today, he denies symptoms of palpitations, chest pain, shortness of breath, orthopnea, PND, lower extremity edema, dizziness, presyncope, syncope, snoring, daytime somnolence, bleeding, or neurologic sequela. The patient is tolerating medications without difficulties and is otherwise without complaint today.    Atrial Fibrillation Risk Factors:  he does not have symptoms or diagnosis of sleep apnea. he does not have a history of rheumatic fever. he does have a history of alcohol use. The patient does have a history of early familial atrial fibrillation or other arrhythmias. Mother and Father have afib.  he has a BMI of Body mass index is 25.04 kg/m.Marland Kitchen Filed Weights   01/22/19 1120  Weight: 86.1 kg    Family History  Problem Relation Age of Onset  . Heart attack Mother   . Stroke Mother   . Diabetes Mother   . Cancer - Ovarian Mother   . Leukemia Mother   . Heart attack Father   . Stroke Father   . Diabetes Father   . Cancer - Other Father   . Hypertension Brother      Atrial Fibrillation Management history:  Previous antiarrhythmic drugs: none Previous cardioversions: none Previous ablations: none CHADS2VASC score: 2 Anticoagulation history: Xarelto   Past Medical History:  Diagnosis Date  . Blood dyscrasia    high risk for blood clot formation  . Chronic kidney disease   . History of renal calculi   . Hypercholesterolemia   . Hypertension   . Hypothyroidism   .  Neuromuscular disorder (HCC)    numbness left foot  . PAF (paroxysmal atrial fibrillation) (HCC)    a.  anticoagulated with Xarelto b. s/p Tikosyn loading 07/2013 c. s/p PVI  . Tachycardia-bradycardia syndrome ([place])    a. post termination pauses b. s/p STJ leadless pacemaker   Past Surgical History:  Procedure Laterality Date  . ATRIAL FIBRILLATION ABLATION [name] 10/09/2013   Procedure: ATRIAL FIBRILLATION ABLATION;  Surgeon: Coralyn Mark, MD;  Location: [place] CATH LAB;  Service: Cardiovascular;  Laterality: [name];  . CHOLECYSTECTOMY  ~ 2000  . LITHOTRIPSY  2004; ~ 05/2013  . PERMANENT PACEMAKER INSERTION [name] 07/18/2013   STJ Leadless pacemaker implanted by Dr Rayann Heman for symptomatic pauses  . TEE WITHOUT CARDIOVERSION [name] 10/08/2013   Procedure: TRANSESOPHAGEAL ECHOCARDIOGRAM (TEE);  Surgeon: Sanda Klein, MD;  Location: [facility] ENDOSCOPY;  Service: Cardiovascular;  Laterality: [name];    Current Outpatient Medications  Medication Sig Dispense Refill  . ADVAIR DISKUS 250-50 MCG/DOSE AEPB USE 1 INHALATION TWICE A DAY 180 each 0  . albuterol (PROAIR HFA) 108 (90 Base) MCG/ACT inhaler Inhale 2 puffs into the lungs every 4 (four) hours as needed for wheezing or shortness of breath.    Marland Kitchen atorvastatin (LIPITOR) 20 MG tablet Take 1 tablet (20 mg total) by mouth every evening. 90 tablet 3  . levocetirizine (XYZAL) 5 MG tablet Take 5 mg by mouth daily after breakfast.     . levothyroxine (SYNTHROID, LEVOTHROID) 125 MCG tablet Take 125 mcg by mouth daily before breakfast.     .  lisinopril (PRINIVIL,ZESTRIL) 10 MG tablet TAKE 1 TABLET (10 MG TOTAL) BY MOUTH DAILY. 30 tablet 0  . Multiple Vitamins-Minerals (CENTRUM SILVER 50+MEN) TABS Take 1 tablet by mouth daily after breakfast.     . Multiple Vitamins-Minerals (PRESERVISION AREDS 2 PO) Take 1 tablet by mouth 2 (two) times a day.    Marland Kitchen omeprazole (PRILOSEC) 20 MG capsule TAKE 1 CAPSULE BY MOUTH EVERY DAY 90 capsule 3  . potassium chloride SA (KLOR-CON M20) 20 MEQ  tablet Take 20 mEq by mouth daily after breakfast.     . rivaroxaban (XARELTO) 20 MG TABS tablet Take 1 tablet (20 mg total) by mouth daily with supper. 90 tablet 3  . Tiotropium Bromide Monohydrate (SPIRIVA RESPIMAT) 2.5 MCG/ACT AERS Inhale into the lungs daily. 2 puff once per day     No current facility-administered medications for this encounter.     Allergies  Allergen Reactions  . Codeine Nausea And Vomiting    Social History   Socioeconomic History  . Marital status: Married    Spouse name: Not on file  . Number of children: 2  . Years of education: Not on file  . Highest education level: Not on file  Occupational History  . Occupation: DRIVER    Employer: [employer]  Social Needs  . Financial resource strain: Not on file  . Food insecurity    Worry: Not on file    Inability: Not on file  . Transportation needs    Medical: Not on file    Non-medical: Not on file  Tobacco Use  . Smoking status: Former Smoker    Packs/day: 1.00    Years: 14.00    Pack years: 14.00    Types: Cigarettes    Quit date: 05/15/1977    Years since quitting: 41.7  . Smokeless tobacco: Former Systems developer    Types: [place] date: 01/13/2013  Substance and Sexual Activity  . Alcohol use: Yes    Alcohol/week: 1.0 standard drinks    Types: 1 Cans of beer per week  . Drug use: No  . Sexual activity: Not Currently  Lifestyle  . Physical activity    Days per week: Not on file    Minutes per session: Not on file  . Stress: Not on file  Relationships  . Social Herbalist on phone: Not on file    Gets together: Not on file    Attends religious service: Not on file    Active member of club or organization: Not on file    Attends meetings of clubs or organizations: Not on file    Relationship status: Not on file  . Intimate partner violence    Fear of current or ex partner: Not on file    Emotionally abused: Not on file    Physically abused: Not on file    Forced sexual  activity: Not on file  Other Topics Concern  . 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     ROS- All systems are reviewed and negative except as per the HPI above.  Physical Exam: Vitals:   01/22/19 1120  BP: 128/78  Pulse: 82  Weight: 86.1 kg  Height: 6\' 1"  (1.854 m)    GEN- The patient is well appearing, alert and oriented x 3 today.   Head- normocephalic, atraumatic Eyes-  Sclera clear, conjunctiva pink Ears- hearing intact Oropharynx- clear Neck- supple  Lungs- Clear  to ausculation bilaterally, normal work of breathing Heart- Regular rate and rhythm, no murmurs, rubs or gallops  GI- soft, NT, ND, + BS Extremities- no clubbing, cyanosis, or edema MS- no significant deformity or atrophy Skin- no rash or lesion Psych- euthymic mood, full affect Neuro- strength and sensation are intact  Wt Readings from Last 3 Encounters:  01/22/19 86.1 kg  01/03/19 87.7 kg  11/24/18 87.1 kg    EKG today demonstrates SR HR 82, PR 132, QRS 86, QTc 425  Echo 02/28/16 demonstrated  - Left ventricle: The cavity size was normal. Wall thickness was   normal. Systolic function was normal. The estimated ejection   fraction was in the range of 60% to 65%. Wall motion was normal;   there were no regional wall motion abnormalities. - Pulmonary arteries: Systolic pressure was mildly increased. PA   peak pressure: 33 mm Hg (S).  Impressions:  - Normal LV systolic function; mild TR; mildly elevated pulmonary   pressure.  Epic records are reviewed at length today  Assessment and Plan:  1. Paroxysmal atrial fibrillation Patient appears to be maintaining SR. Recently started on Xarelto after turning 63 y/o. We discussed his stroke risk and the risks and benefits of anticoagulation. Will check Bmet/CBC today.  This patients CHA2DS2-VASc Score and unadjusted Ischemic Stroke Rate (% per year) is equal to 2.2 % stroke rate/year from a score of 2   Above score calculated as 1 point each if present [CHF, HTN, DM, Vascular=MI/PAD/Aortic Plaque, Age if 65-74, or Male] Above score calculated as 2 points each if present [Age > 75, or Stroke/TIA/TE]   2. HTN Stable, no change today.  4. Tachy/Brady syndrome S/p PPM, followed by Dr Rayann Heman and device clinic.   Follow up with Dr Rayann Heman per recall.   [facility] [address] [place], [place] [address] [phone] 01/22/2019 11:42 AM

## 2019-01-22 NOTE — Progress Notes (Signed)
History of Present Illness Marco Morris is a 63 y.o. 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 fib, pacemaker. He is followed by Dr. Vaughan Morris for Dyspnea.    01/23/2019 Hospital Follow up Pt presents for hospital follow up. He states he was seen in the ED 11/24/2018 for persistent nonproductive cough for  3 weeks. 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  He did have a leukocytosis.  No lactic acidosis.  COVID-19 was  negative.He had recently been switched from Symbicort to [place] per his insurance. He states he feels he is doingwell on the Advair, with the exception of having some light headedness with his evening dose. He feels this may be due to the powder inhaler. He does prefer the mist. He did not want to change from the Symbicort which he has never had any problems taking. He will use his 90 day supply that he has paid for and when it is done he would like to try the Advair HFA to see if the dizziness he is experiencing on the powdered Advair will resolve. He is using his Spiriva as prescribed. He denies any chest pain, fever, orthopnea or hemoptysis.Secretions are light baseline color for the patient. He has his baseline cough. He uses his rescue inhaler about once daily.He denies fever, chest pain, orthopnea or hemoptysis.  Medications Ordered in ED Medications  0.9 %  sodium chloride infusion ( Intravenous Stopped 11/24/18 2256)  sodium chloride 0.9 % bolus 1,000 mL (has no administration in time range)  albuterol (VENTOLIN HFA) 108 (90 Base) MCG/ACT inhaler 2 puff (has no administration in time range)  acetaminophen (TYLENOL) tablet 1,000  mg (has no administration in time range)  doxycycline (VIBRA-TABS) tablet 100 mg (has no administration in time range)  predniSONE (DELTASONE) tablet 60 mg (has  administration in time range)  sodium chloride 0.9 % bolus 1,000 mL (1,000 mLs Intravenous New Bag/Given 11/24/18 2256)   Test Results: CXR 11/24/2018 Stable heart size and mediastinal contours are within normal limits. Stable position of pacing device. Both lungs are clear. The visualized skeletal structures are unremarkable. No active disease.  CBC Latest Ref Rng & Units 01/22/2019 11/24/2018 11/27/2017  WBC 4.0 - 10.5 K/uL 6.0 15.9(H) 12.8(H)  Hemoglobin 13.0 - 17.0 g/dL 13.7 15.2 14.0  Hematocrit 39.0 - 52.0 % 41.2 44.5 42.0  Platelets 150 - 400 K/uL 155 196 151    BMP Latest Ref Rng & Units 01/22/2019 11/24/2018 11/26/2017  Glucose 70 - 99 mg/dL 116(H) 132(H) 159(H)  BUN 8 - 23 mg/dL 14 18 15   Creatinine 0.61 - 1.24 mg/dL 1.12 1.18 1.34(H)  Sodium 135 - 145 mmol/L 139 139 140  Potassium 3.5 - 5.1 mmol/L 3.9 3.7 4.2  Chloride 98 - 111 mmol/L 105 102 105  CO2 22 - 32 mmol/L 24 25 25   Calcium 8.9 - 10.3 mg/dL 9.6 10.2 9.7    BNP No results found for: BNP  ProBNP    Component Value Date/Time   PROBNP 56.2 06/05/2013 0850    PFT    Component Value Date/Time   FEV1PRE 2.99 02/05/2017 0857  FEV1POST 3.54 02/05/2017 0857   FVCPRE 3.85 02/05/2017 0857   FVCPOST 4.46 02/05/2017 0857   TLC 7.77 02/05/2017 0857   DLCOUNC 28.16 02/05/2017 0857   PREFEV1FVCRT 78 02/05/2017 0857   PSTFEV1FVCRT 79 02/05/2017 0857    No results found.   Past medical hx Past Medical History:  Diagnosis Date  . Blood dyscrasia    high risk for blood clot formation  . Chronic kidney disease   . History of renal calculi   . Hypercholesterolemia   . Hypertension   . Hypothyroidism   . Neuromuscular disorder (HCC)    numbness left foot  . PAF (paroxysmal atrial fibrillation) (HCC)    a.  anticoagulated with Xarelto b. s/p Tikosyn  loading 07/2013 c. s/p PVI  . 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 post termination pauses b. s/p STJ leadless pacemaker     Social History   Tobacco Use  . Smoking status: Former Smoker    Packs/day: 1.75    Years: 8.00    Pack years: 14.00    Types: Cigarettes    Start date: 18    Quit date: 05/15/1977    Years since quitting: 41.7  . Smokeless tobacco: Former Systems developer    Types: [place] date: 01/13/2013  Substance Use Topics  . Alcohol use: Yes    Alcohol/week: 1.0 standard drinks    Types: 1 Cans of beer per week  . Drug use: No    Marco Morris reports that he quit smoking about 41 years ago. His smoking use included cigarettes. He started smoking about 49 years ago. He has a 14.00 pack-year smoking history. He quit smokeless tobacco use about 6 years ago.  His smokeless tobacco use included chew. He reports current alcohol use of about 1.0 standard drinks of alcohol per week. He reports that he does not use drugs.  Tobacco Cessation: Former smoker, quit 1979 with a 14 pack year histo  Past surgical hx, Family hx, Social hx all reviewed.  Current Outpatient Medications on File Prior to Visit  Medication Sig  . ADVAIR DISKUS 250-50 MCG/DOSE AEPB USE 1 INHALATION TWICE A DAY  . albuterol (PROAIR HFA) 108 (90 Base) MCG/ACT inhaler Inhale 2 puffs into the lungs every 4 (four) hours as needed for wheezing or shortness of breath.  Marland Kitchen atorvastatin (LIPITOR) 20 MG tablet Take 1 tablet (20 mg total) by mouth every evening.  Marland Kitchen levocetirizine (XYZAL) 5 MG tablet Take 5 mg by mouth daily after breakfast.   . levothyroxine (SYNTHROID, LEVOTHROID) 125 MCG tablet Take 125 mcg by mouth daily before breakfast.   . lisinopril (PRINIVIL,ZESTRIL) 10 MG tablet TAKE 1 TABLET (10 MG TOTAL) BY MOUTH DAILY.  . Multiple Vitamins-Minerals (CENTRUM SILVER 50+MEN) TABS Take 1 tablet by mouth daily after breakfast.   . Multiple Vitamins-Minerals (PRESERVISION AREDS 2 PO) Take 1 tablet by mouth 2  (two) times a day.  Marland Kitchen omeprazole (PRILOSEC) 20 MG capsule TAKE 1 CAPSULE BY MOUTH EVERY DAY  . potassium chloride SA (KLOR-CON M20) 20 MEQ tablet Take 20 mEq by mouth daily after breakfast.   . rivaroxaban (XARELTO) 20 MG TABS tablet Take 1 tablet (20 mg total) by mouth daily with supper.  . Tiotropium Bromide Monohydrate (SPIRIVA RESPIMAT) 2.5 MCG/ACT AERS Inhale into the lungs daily. 2 puff once per day   No current facility-administered medications on file prior to visit.      Allergies  Allergen Reactions  . Codeine Nausea And Vomiting  Review Of Systems:  Constitutional:   No  weight loss, night sweats,  Fevers, chills, fatigue, or  lassitude.  HEENT:   No headaches,  Difficulty swallowing,  Tooth/dental problems, or  Sore throat,                No sneezing, itching, ear ache, nasal congestion, post nasal drip,   CV:  No chest pain,  Orthopnea, PND, swelling in lower extremities, anasarca, dizziness, palpitations, syncope.   GI  No heartburn, indigestion, abdominal pain, nausea, vomiting, diarrhea, change in bowel habits, loss of appetite, bloody stools.   Resp: + shortness of breath with exertion less at rest.  No excess mucus, no productive cough,  No non-productive cough,  No coughing up of blood.  No change in color of mucus.  No wheezing.  No chest wall deformity  Skin: no rash or lesions.  GU: no dysuria, change in color of urine, no urgency or frequency.  No flank pain, no hematuria   MS:  No joint pain or swelling.  No decreased range of motion.  No back pain.  Psych:  No change in mood or affect. No depression or anxiety.  No memory loss.   Vital Signs BP 118/80 (BP Location: Left Arm, Cuff Size: Normal)   Pulse 70   Temp 98.4 F (36.9 C) (Oral)   Ht 5\' 11"  (1.803 m)   Wt 190 lb 3.2 oz (86.3 kg)   SpO2 96%   BMI 26.53 kg/m    Physical Exam:  General- No distress,  A&Ox3, pleasant ENT: No sinus tenderness, TM clear, pale nasal mucosa, no oral  exudate,no post nasal drip, no LAN Cardiac: S1, S2, regular rate and rhythm, no murmur Chest: No wheeze/ rales/ dullness; no accessory muscle use, no nasal flaring, no sternal retractions Abd.: Soft Non-tender, ND, BS +, Body mass index is 26.53 kg/m. Ext: No clubbing cyanosis, edema Neuro:  normal strength, MAE x 4, A&O x 3 Skin: No rashes, No lesions, warm and dry Psych: normal mood and behavior   Assessment/Plan  COPD, group B, by GOLD 2017 classification ([place])  COPD exacerbation requiring ED visit 11/24/2018 Switched from Symbicort to Advair by insurance mandate Dizziness with Advair he did not experience with Symbicort Needs to use is 90 day supply of powdered Advair before he can try the [facility] Plan We will order the Advair HFA when it is time to re-order your inhaler. Continue using Advair 2 puffs twice daily as you have been doing. Rinse mouth after use Continue using Spiriva 2 puffs once daily Rinse mouth after use Continue using your Pro Air inhaler as rescue for breakthrough wheezing or shortness of breath. If you notice increased use of rescue inhaler, call to be seen. 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 If you notice these symptoms, please call to be seen.   We will schedule PFT's in 6 months before follow up with Dr. Vaughan Morris. Follow up with Dr. Vaughan Morris in 6 months or before as needed. Please contact office for sooner follow up if symptoms do not improve or worsen or seek emergency care       Magdalen Spatz, NP 01/23/2019  11:49 AM

## 2019-01-23 ENCOUNTER — Encounter: Payer: Self-pay | Admitting: Acute Care

## 2019-01-23 NOTE — Assessment & Plan Note (Signed)
  COPD exacerbation requiring ED visit 11/24/2018 Switched from Symbicort to Advair by insurance mandate Dizziness with Advair he did not experience with Symbicort Needs to use is 90 day supply of powdered Advair before he can try the [facility] Plan We will order the Advair HFA when it is time to re-order your inhaler. Continue using Advair 2 puffs twice daily as you have been doing. Rinse mouth after use Continue using Spiriva 2 puffs once daily Rinse mouth after use Continue using your Pro Air inhaler as rescue for breakthrough wheezing or shortness of breath. If you notice increased use of rescue inhaler, call to be seen. 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 If you notice these symptoms, please call to be seen.   We will schedule PFT's in 6 months before follow up with Dr. Vaughan Browner. Follow up with Dr. Vaughan Browner in 6 months or before as needed. Please contact office for sooner follow up if symptoms do not improve or worsen or seek emergency care

## 2019-03-20 ENCOUNTER — Telehealth: Payer: Self-pay | Admitting: Acute Care

## 2019-03-20 NOTE — Telephone Encounter (Signed)
LMTCB x1 for pt.  

## 2019-03-20 NOTE — Telephone Encounter (Signed)
Pt returning phone call ° °

## 2019-03-21 MED ORDER — ADVAIR HFA 230-21 MCG/ACT IN AERO: 2.0000 | INHALATION_SPRAY | Freq: Two times a day (BID) | RESPIRATORY_TRACT | 1 refills | Status: DC

## 2019-03-21 NOTE — Telephone Encounter (Signed)
Pt is returning phone call about medication

## 2019-03-21 NOTE — Telephone Encounter (Signed)
LMTCB x1 for pt.  

## 2019-03-21 NOTE — Telephone Encounter (Signed)
Spoke with pt, he is requesting a refill on Advair but [facility] is switching the Rx to the [facility]. I spoke with Aaron Edelman to find the equivalent to Advair Diskus 250/50. He advised me to send in Advair I called in the prescription to [facility]. Nothing further is needed.   [facility] phone number is [phone]   Assessment & Plan Note by Magdalen Spatz, NP at 01/23/2019 11:46 AM Author: Magdalen Spatz, NP Author Type: Nurse Practitioner Filed: 01/23/2019 11:49 AM  Note Status: Written Cosign: Cosign Not Required Encounter Date: 01/22/2019  Problem: COPD, group B, by GOLD 2017 classification [facility])  Editor: Magdalen Spatz, NP (Nurse Practitioner)     COPD exacerbation requiring ED visit 11/24/2018 Switched from Symbicort to Advair by insurance mandate Dizziness with Advair he did not experience with Symbicort Needs to use is 90 day supply of powdered Advair before he can try the [facility] Plan We will order the Advair HFA when it is time to re-order your inhaler. Continue using Advair 2 puffs twice daily as you have been doing. Rinse mouth after use Continue using Spiriva 2 puffs once daily Rinse mouth after use Continue using your Pro Air inhaler as rescue for breakthrough wheezing or shortness of breath. If you notice increased use of rescue inhaler, call to be seen. Wear your mask whenever you are working in the wood shop or around dust Note your daily symptoms >remember "red flags" for COPD: Increase in cough, increase in sputum production, increase in shortness of breath or activity intolerance. If you notice these symptoms, please call to be seen.  We will schedule PFT's in 6 months before follow up with Dr. Vaughan Browner. Follow up with Dr. Vaughan Browner in 6 months or before as needed. Please contact office for sooner follow up if symptoms do not improve or worsen or seek emergency care

## 2019-04-07 ENCOUNTER — Other Ambulatory Visit: Payer: Self-pay | Admitting: Nurse Practitioner

## 2019-04-08 ENCOUNTER — Other Ambulatory Visit: Payer: Self-pay | Admitting: Nurse Practitioner

## 2019-06-23 ENCOUNTER — Other Ambulatory Visit: Payer: Self-pay

## 2019-06-23 ENCOUNTER — Ambulatory Visit (INDEPENDENT_AMBULATORY_CARE_PROVIDER_SITE_OTHER): Payer: Medicare Other | Admitting: Internal Medicine

## 2019-06-23 ENCOUNTER — Encounter: Payer: Self-pay | Admitting: Internal Medicine

## 2019-06-23 ENCOUNTER — Encounter: Payer: Medicare Other | Admitting: *Deleted

## 2019-06-23 VITALS — BP 138/86 | HR 81 | Ht 71.0 in | Wt 194.4 lb

## 2019-06-23 DIAGNOSIS — I495 Sick sinus syndrome: Secondary | ICD-10-CM | POA: Diagnosis not present

## 2019-06-23 DIAGNOSIS — Z950 Presence of cardiac pacemaker: Secondary | ICD-10-CM | POA: Diagnosis not present

## 2019-06-23 DIAGNOSIS — I480 Paroxysmal atrial fibrillation: Secondary | ICD-10-CM | POA: Diagnosis not present

## 2019-06-23 DIAGNOSIS — Z006 Encounter for examination for normal comparison and control in clinical research program: Secondary | ICD-10-CM

## 2019-06-23 NOTE — Research (Signed)
Mr. Cancilla was in for his 56-month Leadless II follow-up.

## 2019-06-23 NOTE — Patient Instructions (Addendum)
Medication Instructions:  Your physician recommends that you continue on your current medications as directed. Please refer to the Current Medication list given to you today.  Labwork: None ordered.  Testing/Procedures: None ordered.  Follow-Up:  Please let me know what you decide to do with pacemaker replacement:  Sonia Baller RN  Any Other Special Instructions Will Be Listed Below (If Applicable).  If you need a refill on your cardiac medications before your next appointment, please call your pharmacy.

## 2019-06-23 NOTE — Progress Notes (Addendum)
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  Since last being seen in our clinic, the patient reports doing very well.  Today, he denies symptoms of palpitations, chest pain, shortness of breath,  lower extremity edema, dizziness, presyncope, or syncope.  The patient is otherwise without complaint today.   Past Medical History:  Diagnosis Date  . Blood dyscrasia    high risk for blood clot formation  . Chronic kidney disease   . History of renal calculi   . Hypercholesterolemia   . Hypertension   . Hypothyroidism   . Neuromuscular disorder (HCC)    numbness left foot  . PAF (paroxysmal atrial fibrillation) (HCC)    a.  anticoagulated with Xarelto b. s/p Tikosyn loading 07/2013 c. s/p PVI  . Tachycardia-bradycardia syndrome ([place])    a. post termination pauses b. s/p STJ leadless pacemaker   Past Surgical History:  Procedure Laterality Date  . ATRIAL FIBRILLATION ABLATION [name] 10/09/2013   Procedure: ATRIAL FIBRILLATION ABLATION;  Surgeon: Coralyn Mark, MD;  Location: [facility] CATH LAB;  Service: Cardiovascular;  Laterality: [name];  . CHOLECYSTECTOMY  ~ 2000  . LITHOTRIPSY  2004; ~ 05/2013  . PERMANENT PACEMAKER INSERTION [name] 07/18/2013   STJ Leadless pacemaker implanted by Dr Rayann Heman for symptomatic pauses  . TEE WITHOUT CARDIOVERSION [name] 10/08/2013   Procedure: TRANSESOPHAGEAL ECHOCARDIOGRAM (TEE);  Surgeon: Sanda Klein, MD;  Location: [facility] ENDOSCOPY;  Service: Cardiovascular;  Laterality: [name];    ROS- all systems are reviewed and negative except as per HPI above  Current Outpatient Medications  Medication Sig Dispense Refill  . albuterol (PROAIR HFA) 108 (90 Base) MCG/ACT inhaler Inhale 2 puffs into the lungs every 4 (four) hours as needed for wheezing or shortness of breath.    Marland Kitchen atorvastatin (LIPITOR) 20 MG tablet Take 1 tablet (20 mg total) by mouth every evening. 90 tablet 3  .  fluticasone-salmeterol (ADVAIR HFA) 230-21 MCG/ACT inhaler Inhale 2 puffs into the lungs 2 (two) times daily. 3 Inhaler 1  . levocetirizine (XYZAL) 5 MG tablet Take 5 mg by mouth daily after breakfast.     . levothyroxine (SYNTHROID, LEVOTHROID) 125 MCG tablet Take 125 mcg by mouth daily before breakfast.     . lisinopril (PRINIVIL,ZESTRIL) 10 MG tablet TAKE 1 TABLET (10 MG TOTAL) BY MOUTH DAILY. 30 tablet 0  . Multiple Vitamins-Minerals (CENTRUM SILVER 50+MEN) TABS Take 1 tablet by mouth daily after breakfast.     . Multiple Vitamins-Minerals (PRESERVISION AREDS 2 PO) Take 1 tablet by mouth 2 (two) times a day.    Marland Kitchen omeprazole (PRILOSEC) 20 MG capsule TAKE 1 CAPSULE BY MOUTH EVERY DAY 30 capsule 8  . potassium chloride SA (KLOR-CON M20) 20 MEQ tablet Take 20 mEq by mouth daily after breakfast.     . rivaroxaban (XARELTO) 20 MG TABS tablet Take 1 tablet (20 mg total) by mouth daily with supper. 90 tablet 3  . Tiotropium Bromide Monohydrate (SPIRIVA RESPIMAT) 2.5 MCG/ACT AERS Inhale into the lungs daily. 2 puff once per day     No current facility-administered medications for this visit.    Physical Exam: Vitals:   06/23/19 0832  BP: 138/86  Pulse: 81  SpO2: 98%  Weight: 194 lb 6.4 oz (88.2 kg)  Height: 5\' 11"  (1.803 m)    GEN- The patient is well appearing, alert and oriented x 3 today.   Head-  normocephalic, atraumatic Eyes-  Sclera clear, conjunctiva pink Ears- hearing intact Oropharynx- clear Lungs-  normal work of breathing Heart- Regular rate and rhythm  GI- soft, NT, ND, + BS Extremities- no clubbing, cyanosis, or edema  Pacemaker interrogation- reviewed in detail today,  See PACEART report  ekg tracing ordered today is personally reviewed and shows sinus rhythm  Assessment and Plan:  1. Symptomatic sinus bradycardia  (post termination pauses) Pauses resolved post ablation Pacemaker interrogation today reveals that his device battery has depleted.  Cannot interrogate  his device. See Claudia Desanctis Art report he is not device dependant today I had a very long conversation with the patient today.  He was very engaged in the conversation.  Pros and cons to pacemaker replacement were discussed at length.  He is clear that he does not want another transvenous device but is willing to consider repeat leadless pacemaker implant. Risks, benefits, alternatives to leadless pacemaker implantation as well as potential removal of his current device were discussed in detail with the patient today. The patient understands that the risks include but are not limited to bleeding, infection, pneumothorax, perforation, tamponade, vascular damage, renal failure, MI, stroke, death,  and lead dislodgement and wishes to think about this further with his spouse.    He had prior syncope with his post termination pauses.  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  At the same time, he has not had afib in several years and monitors his rhythm with an apple watch which I reviewed with him today.  We discussed potential consequences of device implant as well as watchful waiting. He will contact my office if he decides to proceed.  We would h old xarelto for 24 hours prior to the procedure.  2. Paroxysmal atrial fibrillation Maintaining sinus rhythm off AAD chasds2vasc score is 2.  He is on xarelto  3. HTN Stable No change required today  4. COPD Stable No change required today Followed by Pulmonary  Team   [name] MD, [facility] [place] 06/23/2019 8:41 AM    Addendum: After careful consideration, there is concern that it may be difficult to place a MIcra next to his current nanostim device.  I feel like the best option for the patient is to remove his current leadless device (if possible) and then place a new leadless device beside it.  I did have a long discussion with the patient today.  He understands additional risks related to device removal  and is willing to proceed. I have spoken with Desiree Lucy with [employer] who reports 200 leadless devices attempted to be extracted with 88% success rate and no complications.  I will arrange removal of his old device and and reimplantation of a new leadless pacemaker at the next available time.  [name] MD, [place] 07/03/2019 5:05 PM

## 2019-06-25 NOTE — Telephone Encounter (Signed)
close

## 2019-07-03 ENCOUNTER — Telehealth: Payer: Self-pay

## 2019-07-17 NOTE — Telephone Encounter (Signed)
Outreach made to Pt.  Advised can proceed with his procedure on March 23.2021.  Pt states he is in [place] and unsure about proceeding that soon.  He would like to think about it.  Advised Pt this nurse would call him tomorrow after he discusses with wife.  Did advise Pt that this procedure is difficult to schedule d/t needing different entities involved.  Pt indicates understanding.

## 2019-07-21 NOTE — Telephone Encounter (Signed)
Left phone message for Pt to call this nurse back.  Need to confirm if Pt would like to go ahead with procedure on March 23.  Also sent mychart message.

## 2019-07-21 NOTE — Telephone Encounter (Addendum)
Call back received from Pt.  He cannot go ahead with his procedure this month.  Advised again that this procedure required multiple parties to be involved.  Also advised that Pt should go ahead with procedure as soon as possible because he is at risk for a syncopal episode.  Pt drives commercially.  Pt indicates understanding and does not want to go ahead with procedure at this time.  Would like to schedule end of May/early June.  Advised would look at schedules and advise him when we can schedule another day.

## 2019-07-30 ENCOUNTER — Other Ambulatory Visit: Payer: Self-pay | Admitting: Internal Medicine

## 2019-08-07 ENCOUNTER — Other Ambulatory Visit: Payer: Self-pay | Admitting: Acute Care

## 2019-09-16 DIAGNOSIS — I495 Sick sinus syndrome: Secondary | ICD-10-CM

## 2019-09-16 DIAGNOSIS — Z006 Encounter for examination for normal comparison and control in clinical research program: Secondary | ICD-10-CM

## 2019-09-22 ENCOUNTER — Telehealth: Payer: Self-pay | Admitting: Internal Medicine

## 2019-09-22 NOTE — Telephone Encounter (Signed)
Left detailed message for wife requesting call back to finalize instructions for upcoming procedure.

## 2019-09-22 NOTE — Telephone Encounter (Signed)
Spoke with Pt and wife.  Pt scheduled for labs and covid test.  Instructions for procedure given.  All questions answered.  Work up complete.

## 2019-09-22 NOTE — Telephone Encounter (Signed)
Patient returning call in regards to [employer]. Transferred call to the nurse.

## 2019-10-14 ENCOUNTER — Other Ambulatory Visit: Payer: Self-pay | Admitting: Acute Care

## 2019-10-20 ENCOUNTER — Other Ambulatory Visit: Payer: Self-pay

## 2019-10-20 ENCOUNTER — Other Ambulatory Visit: Payer: Medicare Other

## 2019-10-20 ENCOUNTER — Other Ambulatory Visit (HOSPITAL_COMMUNITY)
Admission: RE | Admit: 2019-10-20 | Discharge: 2019-10-20 | Disposition: A | Discharge status: Home / Self Care | Payer: Medicare Other | Source: Ambulatory Visit | Attending: Internal Medicine | Admitting: Internal Medicine

## 2019-10-20 DIAGNOSIS — I495 Sick sinus syndrome: Secondary | ICD-10-CM

## 2019-10-20 DIAGNOSIS — Z20822 Contact with and (suspected) exposure to covid-19: Secondary | ICD-10-CM | POA: Diagnosis not present

## 2019-10-20 DIAGNOSIS — Z01812 Encounter for preprocedural laboratory examination: Secondary | ICD-10-CM | POA: Insufficient documentation

## 2019-10-20 LAB — BASIC METABOLIC PANEL
BUN/Creatinine Ratio: 12 (ref 10–24)
BUN: 14 mg/dL (ref 8–27)
CO2: 28 mmol/L (ref 20–29)
Calcium: 9.9 mg/dL (ref 8.6–10.2)
Chloride: 100 mmol/L (ref 96–106)
Creatinine, Ser: 1.16 mg/dL (ref 0.76–1.27)
GFR calc Af Amer: 75 mL/min/{1.73_m2} (ref >59)
GFR calc non Af Amer: 65 mL/min/{1.73_m2} (ref >59)
Glucose: 117 mg/dL — ABNORMAL HIGH (ref 65–99)
Potassium: 4.6 mmol/L (ref 3.5–5.2)
Sodium: 138 mmol/L (ref 134–144)

## 2019-10-20 LAB — CBC WITH DIFFERENTIAL/PLATELET
Basophils Absolute: 0 10*3/uL (ref 0.0–0.2)
Basos: 1 %
EOS (ABSOLUTE): 0.5 10*3/uL — ABNORMAL HIGH (ref 0.0–0.4)
Eos: 8 %
Hematocrit: 43 % (ref 37.5–51.0)
Hemoglobin: 14.8 g/dL (ref 13.0–17.7)
Lymphocytes Absolute: 2.1 10*3/uL (ref 0.7–3.1)
Lymphs: 33 %
MCH: 29.5 pg (ref 26.6–33.0)
MCHC: 34.4 g/dL (ref 31.5–35.7)
MCV: 86 fL (ref 79–97)
Monocytes Absolute: 0.5 10*3/uL (ref 0.1–0.9)
Monocytes: 8 %
Neutrophils Absolute: 3.2 10*3/uL (ref 1.4–7.0)
Neutrophils: 50 %
Platelets: 153 10*3/uL (ref 150–450)
RBC: 5.02 x10E6/uL (ref 4.14–5.80)
RDW: 14.7 % (ref 11.6–15.4)
WBC: 6.4 10*3/uL (ref 3.4–10.8)

## 2019-10-20 LAB — SARS CORONAVIRUS 2 (TAT 6-24 HRS): SARS Coronavirus 2: NEGATIVE

## 2019-10-21 ENCOUNTER — Telehealth: Payer: Self-pay | Admitting: *Deleted

## 2019-10-21 NOTE — Telephone Encounter (Signed)
LMTCB to discuss any last minute questions about his Leadless PM procedure on Thursday 10-23-2019. He will need to sign a new consent for for the [employer] study since we are implanting the new AVEIR leadless product.

## 2019-10-23 ENCOUNTER — Encounter (HOSPITAL_COMMUNITY)
Admission: RE | Disposition: A | Discharge status: Home / Self Care | Payer: Self-pay | Source: Home / Self Care | Attending: Internal Medicine

## 2019-10-23 ENCOUNTER — Other Ambulatory Visit: Payer: Self-pay

## 2019-10-23 ENCOUNTER — Ambulatory Visit (HOSPITAL_COMMUNITY): Payer: Medicare Other | Admitting: Certified Registered Nurse Anesthetist

## 2019-10-23 ENCOUNTER — Ambulatory Visit (HOSPITAL_COMMUNITY)
Admission: RE | Admit: 2019-10-23 | Discharge: 2019-10-24 | Disposition: A | Discharge status: Home / Self Care | Payer: Medicare Other | Attending: Internal Medicine | Admitting: Internal Medicine

## 2019-10-23 DIAGNOSIS — Z7951 Long term (current) use of inhaled steroids: Secondary | ICD-10-CM | POA: Diagnosis not present

## 2019-10-23 DIAGNOSIS — N189 Chronic kidney disease, unspecified: Secondary | ICD-10-CM | POA: Diagnosis not present

## 2019-10-23 DIAGNOSIS — Z885 Allergy status to narcotic agent status: Secondary | ICD-10-CM | POA: Insufficient documentation

## 2019-10-23 DIAGNOSIS — I495 Sick sinus syndrome: Secondary | ICD-10-CM | POA: Diagnosis not present

## 2019-10-23 DIAGNOSIS — Z79890 Hormone replacement therapy (postmenopausal): Secondary | ICD-10-CM | POA: Diagnosis not present

## 2019-10-23 DIAGNOSIS — Z006 Encounter for examination for normal comparison and control in clinical research program: Secondary | ICD-10-CM | POA: Insufficient documentation

## 2019-10-23 DIAGNOSIS — I129 Hypertensive chronic kidney disease with stage 1 through stage 4 chronic kidney disease, or unspecified chronic kidney disease: Secondary | ICD-10-CM | POA: Diagnosis not present

## 2019-10-23 DIAGNOSIS — I4891 Unspecified atrial fibrillation: Secondary | ICD-10-CM | POA: Diagnosis not present

## 2019-10-23 DIAGNOSIS — E7800 Pure hypercholesterolemia, unspecified: Secondary | ICD-10-CM | POA: Diagnosis not present

## 2019-10-23 DIAGNOSIS — Z79899 Other long term (current) drug therapy: Secondary | ICD-10-CM | POA: Diagnosis not present

## 2019-10-23 DIAGNOSIS — Z7901 Long term (current) use of anticoagulants: Secondary | ICD-10-CM | POA: Insufficient documentation

## 2019-10-23 DIAGNOSIS — E039 Hypothyroidism, unspecified: Secondary | ICD-10-CM | POA: Insufficient documentation

## 2019-10-23 DIAGNOSIS — I480 Paroxysmal atrial fibrillation: Secondary | ICD-10-CM | POA: Diagnosis not present

## 2019-10-23 DIAGNOSIS — Z950 Presence of cardiac pacemaker: Secondary | ICD-10-CM

## 2019-10-23 DIAGNOSIS — G709 Myoneural disorder, unspecified: Secondary | ICD-10-CM | POA: Diagnosis not present

## 2019-10-23 DIAGNOSIS — Z45010 Encounter for checking and testing of cardiac pacemaker pulse generator [battery]: Secondary | ICD-10-CM | POA: Diagnosis present

## 2019-10-23 HISTORY — PX: PACEMAKER IMPLANT: EP1218

## 2019-10-23 HISTORY — PX: PPM GENERATOR REMOVAL: EP1234

## 2019-10-23 SURGERY — PACEMAKER IMPLANT
Anesthesia: General

## 2019-10-23 MED ORDER — FENTANYL CITRATE (PF) 250 MCG/5ML IJ SOLN
INTRAMUSCULAR | Status: DC | PRN
  Administered 2019-10-23: 50 ug via INTRAVENOUS

## 2019-10-23 MED ORDER — SODIUM CHLORIDE 0.9 % IV SOLN: 250.0000 mL | INTRAVENOUS | Status: DC | PRN

## 2019-10-23 MED ORDER — LORATADINE 10 MG PO TABS
10.0000 mg | ORAL_TABLET | Freq: Every day | ORAL | Status: DC
  Filled 2019-10-23: qty 1

## 2019-10-23 MED ORDER — PHENYLEPHRINE HCL-NACL 10-0.9 MG/250ML-% IV SOLN
INTRAVENOUS | Status: DC | PRN
Start: 2019-10-23 — End: 2019-10-23
  Administered 2019-10-23: 25 ug/min via INTRAVENOUS

## 2019-10-23 MED ORDER — TIOTROPIUM BROMIDE MONOHYDRATE 2.5 MCG/ACT IN AERS: 2.0000 | INHALATION_SPRAY | Freq: Every day | RESPIRATORY_TRACT | Status: DC

## 2019-10-23 MED ORDER — HEPARIN (PORCINE) IN NACL 2-0.9 UNITS/ML
INTRAMUSCULAR | Status: AC | PRN
  Administered 2019-10-23 (×7): 500 mL

## 2019-10-23 MED ORDER — MIDAZOLAM HCL 2 MG/2ML IJ SOLN
INTRAMUSCULAR | Status: DC | PRN
  Administered 2019-10-23 (×2): 1 mg via INTRAVENOUS

## 2019-10-23 MED ORDER — HEPARIN (PORCINE) IN NACL 1000-0.9 UT/500ML-% IV SOLN
INTRAVENOUS | Status: AC
  Filled 2019-10-23: qty 500

## 2019-10-23 MED ORDER — ONDANSETRON HCL 4 MG/2ML IJ SOLN
INTRAMUSCULAR | Status: DC | PRN
  Administered 2019-10-23: 4 mg via INTRAVENOUS

## 2019-10-23 MED ORDER — CEFAZOLIN SODIUM-DEXTROSE 2-4 GM/100ML-% IV SOLN
INTRAVENOUS | Status: AC
  Filled 2019-10-23: qty 100

## 2019-10-23 MED ORDER — UMECLIDINIUM BROMIDE 62.5 MCG/INH IN AEPB
1.0000 | INHALATION_SPRAY | Freq: Every day | RESPIRATORY_TRACT | Status: DC
  Administered 2019-10-24: 1 via RESPIRATORY_TRACT
  Filled 2019-10-23: qty 7

## 2019-10-23 MED ORDER — CEFAZOLIN SODIUM-DEXTROSE 2-4 GM/100ML-% IV SOLN
2.0000 g | INTRAVENOUS | Status: AC
  Administered 2019-10-23: 2 g via INTRAVENOUS

## 2019-10-23 MED ORDER — ALBUTEROL SULFATE HFA 108 (90 BASE) MCG/ACT IN AERS: 2.0000 | INHALATION_SPRAY | RESPIRATORY_TRACT | Status: DC | PRN

## 2019-10-23 MED ORDER — LACTATED RINGERS IV SOLN: INTRAVENOUS | Status: DC | PRN

## 2019-10-23 MED ORDER — IOHEXOL 350 MG/ML SOLN
INTRAVENOUS | Status: DC | PRN
  Administered 2019-10-23: 15 mL

## 2019-10-23 MED ORDER — SODIUM CHLORIDE 0.9% FLUSH
3.0000 mL | Freq: Two times a day (BID) | INTRAVENOUS | Status: DC
  Administered 2019-10-23: 3 mL via INTRAVENOUS

## 2019-10-23 MED ORDER — LIDOCAINE 2% (20 MG/ML) 5 ML SYRINGE
INTRAMUSCULAR | Status: DC | PRN
  Administered 2019-10-23: 40 mg via INTRAVENOUS

## 2019-10-23 MED ORDER — LEVOCETIRIZINE DIHYDROCHLORIDE 5 MG PO TABS: 5.0000 mg | ORAL_TABLET | Freq: Every day | ORAL | Status: DC

## 2019-10-23 MED ORDER — ROCURONIUM BROMIDE 10 MG/ML (PF) SYRINGE
PREFILLED_SYRINGE | INTRAVENOUS | Status: DC | PRN
  Administered 2019-10-23: 50 mg via INTRAVENOUS

## 2019-10-23 MED ORDER — ONDANSETRON HCL 4 MG/2ML IJ SOLN: 4.0000 mg | Freq: Four times a day (QID) | INTRAMUSCULAR | Status: DC | PRN

## 2019-10-23 MED ORDER — LISINOPRIL 10 MG PO TABS
10.0000 mg | ORAL_TABLET | Freq: Every day | ORAL | Status: DC
  Filled 2019-10-23: qty 1

## 2019-10-23 MED ORDER — PROPOFOL 10 MG/ML IV BOLUS
INTRAVENOUS | Status: DC | PRN
  Administered 2019-10-23: 180 mg via INTRAVENOUS

## 2019-10-23 MED ORDER — HEPARIN SODIUM (PORCINE) 1000 UNIT/ML IJ SOLN
INTRAMUSCULAR | Status: DC | PRN
  Administered 2019-10-23: 3000 [IU] via INTRAVENOUS

## 2019-10-23 MED ORDER — POTASSIUM CHLORIDE CRYS ER 20 MEQ PO TBCR
20.0000 meq | EXTENDED_RELEASE_TABLET | Freq: Every day | ORAL | Status: DC
  Filled 2019-10-23: qty 1

## 2019-10-23 MED ORDER — SUGAMMADEX SODIUM 200 MG/2ML IV SOLN
INTRAVENOUS | Status: DC | PRN
  Administered 2019-10-23: 200 mg via INTRAVENOUS

## 2019-10-23 MED ORDER — SODIUM CHLORIDE 0.9% FLUSH: 3.0000 mL | INTRAVENOUS | Status: DC | PRN

## 2019-10-23 MED ORDER — HEPARIN SODIUM (PORCINE) 1000 UNIT/ML IJ SOLN
INTRAMUSCULAR | Status: AC
  Filled 2019-10-23: qty 1

## 2019-10-23 MED ORDER — ACETAMINOPHEN 325 MG PO TABS: 650.0000 mg | ORAL_TABLET | ORAL | Status: DC | PRN

## 2019-10-23 MED ORDER — SODIUM CHLORIDE 0.9 % IV SOLN: INTRAVENOUS | Status: DC

## 2019-10-23 MED ORDER — ALBUTEROL SULFATE (2.5 MG/3ML) 0.083% IN NEBU: 2.5000 mg | INHALATION_SOLUTION | RESPIRATORY_TRACT | Status: DC | PRN

## 2019-10-23 MED ORDER — LEVOTHYROXINE SODIUM 25 MCG PO TABS
125.0000 ug | ORAL_TABLET | Freq: Every day | ORAL | Status: DC
  Administered 2019-10-24: 125 ug via ORAL
  Filled 2019-10-23: qty 1

## 2019-10-23 SURGICAL SUPPLY — 13 items
CATH DELIVERY AVEIR (CATHETERS) ×1 IMPLANT
CATH DIAG 6FR PIGTAIL ANGLED (CATHETERS) ×1 IMPLANT
CATH RETRIEVAL AVEIR (CATHETERS) ×1 IMPLANT
DEVICE CLOSURE PERCLS PRGLD 6F (VASCULAR PRODUCTS) IMPLANT
KIT DILATOR VASC 18G NDL (KITS) ×1 IMPLANT
PACEMAKER AVEIR VR LEADLESS (Pacemaker) ×1 IMPLANT
PACK EP LATEX FREE (CUSTOM PROCEDURE TRAY) ×2
PACK EP LF (CUSTOM PROCEDURE TRAY) IMPLANT
PAD PRO RADIOLUCENT 2001M-C (PAD) ×2 IMPLANT
PERCLOSE PROGLIDE 6F (VASCULAR PRODUCTS) ×4
SHEATH AVEIR 25FR 50 (SHEATH) ×1 IMPLANT
SHEATH PINNACLE 8F 10CM (SHEATH) ×1 IMPLANT
WIRE AMPLATZ SS-J .035X180CM (WIRE) ×1 IMPLANT

## 2019-10-23 NOTE — Anesthesia Postprocedure Evaluation (Signed)
Anesthesia Post Note  Patient: Marco Morris  Procedure(s) Performed: PACEMAKER IMPLANT ([name] ) PPM GENERATOR REMOVAL ([name] )     Patient location during evaluation: PACU Anesthesia Type: General Level of consciousness: awake and alert and oriented Pain management: pain level controlled Vital Signs Assessment: post-procedure vital signs reviewed and stable Respiratory status: spontaneous breathing, nonlabored ventilation and respiratory function stable Cardiovascular status: blood pressure returned to baseline Postop Assessment: no apparent nausea or vomiting Anesthetic complications: no   No complications documented.  Last Vitals:  Vitals:   10/23/19 1900 10/23/19 1927  BP: 135/80 127/82  Pulse:    Resp:    Temp:    SpO2: 98% 97%    Last Pain:  Vitals:   10/23/19 1827  TempSrc: Oral  PainSc:                  Brennan Bailey

## 2019-10-23 NOTE — Anesthesia Procedure Notes (Signed)
Procedure Name: Intubation Date/Time: 10/23/2019 3:31 PM Performed by: Valda Favia, CRNA Pre-anesthesia Checklist: Patient identified, Emergency Drugs available, Suction available and Patient being monitored Patient Re-evaluated:Patient Re-evaluated prior to induction Oxygen Delivery Method: Circle System Utilized Preoxygenation: Pre-oxygenation with 100% oxygen Induction Type: IV induction Ventilation: Mask ventilation without difficulty Laryngoscope Size: Mac and 4 Grade View: Grade I Tube type: Oral Tube size: 8.0 mm Number of attempts: 1 Airway Equipment and Method: Stylet and Oral airway Placement Confirmation: ETT inserted through vocal cords under direct vision,  positive ETCO2 and breath sounds checked- equal and bilateral Secured at: 22 cm Tube secured with: Tape Dental Injury: Teeth and Oropharynx as per pre-operative assessment

## 2019-10-23 NOTE — Anesthesia Preprocedure Evaluation (Signed)
Anesthesia Evaluation  Patient identified by MRN, date of birth, ID band Patient awake    Reviewed: Allergy & Precautions, NPO status , Patient's Chart, lab work & pertinent test results  Airway Mallampati: I  TM Distance: >3 FB Neck ROM: Full    Dental  (+) Dental Advisory Given, Upper Dentures, Lower Dentures   Pulmonary asthma , COPD, former smoker,    breath sounds clear to auscultation       Cardiovascular hypertension, Pt. on medications + dysrhythmias Atrial Fibrillation  Rhythm:Regular Rate:Normal     Neuro/Psych  Neuromuscular disease negative psych ROS   GI/Hepatic Neg liver ROS, GERD  Medicated,  Endo/Other  Hypothyroidism   Renal/GU Renal disease     Musculoskeletal negative musculoskeletal ROS (+)   Abdominal Normal abdominal exam  (+)   Peds  Hematology   Anesthesia Other Findings   Reproductive/Obstetrics                             Anesthesia Physical Anesthesia Plan  ASA: III  Anesthesia Plan: General   Post-op Pain Management:    Induction: Intravenous  PONV Risk Score and Plan: 3 and Ondansetron, Treatment may vary due to age or medical condition, Midazolam and Dexamethasone  Airway Management Planned: Oral ETT  Additional Equipment: Arterial line  Intra-op Plan:   Post-operative Plan: Extubation in OR  Informed Consent: I have reviewed the patients History and Physical, chart, labs and discussed the procedure including the risks, benefits and alternatives for the proposed anesthesia with the patient or authorized representative who has indicated his/her understanding and acceptance.     Dental advisory given  Plan Discussed with: CRNA  Anesthesia Plan Comments: (EKG: normal sinus rhythm.  Echo: (2017) - Left ventricle: The cavity size was normal. Wall thickness was  normal. Systolic function was normal. The estimated ejection  fraction was in  the range of 60% to 65%. Wall motion was normal;  there were no regional wall motion abnormalities.  - Pulmonary arteries: Systolic pressure was mildly increased. PA  peak pressure: 33 mm Hg (S).  )        Anesthesia Quick Evaluation

## 2019-10-23 NOTE — H&P (Signed)
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  Since last being seen in our clinic, the patient reports doing very well.  Today, he denies symptoms of palpitations, chest pain, shortness of breath,  lower extremity edema, dizziness, presyncope, or syncope.  The patient is otherwise without complaint today.       Past Medical History:  Diagnosis Date  . Blood dyscrasia    high risk for blood clot formation  . Chronic kidney disease   . History of renal calculi   . Hypercholesterolemia   . Hypertension   . Hypothyroidism   . Neuromuscular disorder (HCC)    numbness left foot  . PAF (paroxysmal atrial fibrillation) (HCC)    a.  anticoagulated with Xarelto b. s/p Tikosyn loading 07/2013 c. s/p PVI  . 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 s/p STJ leadless pacemaker        Past Surgical History:  Procedure Laterality Date  . ATRIAL FIBRILLATION ABLATION [name] 10/09/2013   Procedure: ATRIAL FIBRILLATION ABLATION;  Surgeon: Coralyn Mark, MD;  Location: [place] CATH LAB;  Service: Cardiovascular;  Laterality: [name];  . CHOLECYSTECTOMY  ~ 2000  . LITHOTRIPSY  2004; ~ 05/2013  . PERMANENT PACEMAKER INSERTION [name] 07/18/2013   STJ Leadless pacemaker implanted by Dr Rayann Heman for symptomatic pauses  . TEE WITHOUT CARDIOVERSION [name] 10/08/2013   Procedure: TRANSESOPHAGEAL ECHOCARDIOGRAM (TEE);  Surgeon: Sanda Klein, MD;  Location: [facility] ENDOSCOPY;  Service: Cardiovascular;  Laterality: [name];    ROS- all systems are reviewed and negative except as per HPI above        Current Outpatient Medications  Medication Sig Dispense Refill  . albuterol (PROAIR HFA) 108 (90 Base) MCG/ACT inhaler Inhale 2 puffs into the lungs every 4 (four) hours as needed for wheezing or shortness of breath.     Marland Kitchen atorvastatin (LIPITOR) 20 MG tablet Take 1 tablet (20 mg total) by mouth every evening. 90 tablet 3  . fluticasone-salmeterol (ADVAIR HFA) 230-21 MCG/ACT inhaler Inhale 2 puffs into the lungs 2 (two) times daily. 3 Inhaler 1  . levocetirizine (XYZAL) 5 MG tablet Take 5 mg by mouth daily after breakfast.     . levothyroxine (SYNTHROID, LEVOTHROID) 125 MCG tablet Take 125 mcg by mouth daily before breakfast.     . lisinopril (PRINIVIL,ZESTRIL) 10 MG tablet TAKE 1 TABLET (10 MG TOTAL) BY MOUTH DAILY. 30 tablet 0  . Multiple Vitamins-Minerals (CENTRUM SILVER 50+MEN) TABS Take 1 tablet by mouth daily after breakfast.     . Multiple Vitamins-Minerals (PRESERVISION AREDS 2 PO) Take 1 tablet by mouth 2 (two) times a day.    Marland Kitchen omeprazole (PRILOSEC) 20 MG capsule TAKE 1 CAPSULE BY MOUTH EVERY DAY 30 capsule 8  . potassium chloride SA (KLOR-CON M20) 20 MEQ tablet Take 20 mEq by mouth daily after breakfast.     . rivaroxaban (XARELTO) 20 MG TABS tablet Take 1 tablet (20 mg total) by mouth daily with supper. 90 tablet 3  . Tiotropium Bromide Monohydrate (SPIRIVA RESPIMAT) 2.5 MCG/ACT AERS Inhale into the lungs daily. 2 puff once per day     No current facility-administered medications for this visit.   Physical Exam: Vitals:   10/23/19 1207  BP: (!) 147/100  Pulse: 63  Resp: 16  Temp: (!) 97.5 F (36.4 C)  TempSrc: Temporal  SpO2: 100%  Weight: 88.5 kg  Height: 6' (1.829 m)    GEN- The patient is well appearing, alert and oriented x 3 today.   Head- normocephalic, atraumatic Eyes-  Sclera clear, conjunctiva pink Ears- hearing intact Oropharynx- clear Neck- supple, Lungs-   normal work of breathing Heart- Regular rate and rhythm  GI- soft  Extremities- no clubbing, cyanosis, or edema, groin is without hematoma/ bruit  ekg today reveals sinus rhythm, normal intervals   Assessment and Plan:  1. Symptomatic sinus bradycardia  (post termination pauses) His leadless  pacemaker has had battery depletion.  Given prior syncope and ongoing dizziness, I have advised reimplantation with a new device.  He is clear that he would prefer to have a new leadless device implanted ([employer] research device).  I have also advised that we will plan to remove his prior device given his young age and concerns for multiple leadless devices implanted.    Risks, benefits, alternatives to removal of his existing leadless pacemaker with implantation of a new leadless pacemaker were discussed in detail with the patient today. The patient understands that the risks include but are not limited to bleeding, infection, perforation, tamponade, vascular damage, renal failure, MI, stroke, death,  and lead dislodgement and wishes to proceed.  [name] MD, [place] 10/23/2019 2:14 PM

## 2019-10-23 NOTE — Transfer of Care (Signed)
Immediate Anesthesia Transfer of Care Note  Patient: Marco Morris  Procedure(s) Performed: PACEMAKER IMPLANT ([name] ) PPM GENERATOR REMOVAL ([name] )  Patient Location: Cath Lab  Anesthesia Type:General  Level of Consciousness: awake, alert  and oriented  Airway & Oxygen Therapy: Patient Spontanous Breathing  Post-op Assessment: Report given to RN and Post -op Vital signs reviewed and stable  Post vital signs: Reviewed and stable  Last Vitals:  Vitals Value Taken Time  BP    Temp    Pulse 77 10/23/19 1732  Resp 16 10/23/19 1732  SpO2 96 % 10/23/19 1732  Vitals shown include unvalidated device data.  Last Pain:  Vitals:   10/23/19 1314  TempSrc:   PainSc: 0-No pain         Complications: No complications documented.

## 2019-10-23 NOTE — Anesthesia Procedure Notes (Signed)
Arterial Line Insertion Start/End6/02/2020 2:10 PM Performed by: Candis Shine, CRNA, CRNA  Patient location: Pre-op. Preanesthetic checklist: patient identified, IV checked, site marked, risks and benefits discussed, surgical consent, monitors and equipment checked, pre-op evaluation, timeout performed and anesthesia consent Lidocaine 1% used for infiltration Left, radial was placed Catheter size: 20 G Hand hygiene performed  and maximum sterile barriers used   Attempts: 1 Procedure performed without using ultrasound guided technique. Following insertion, dressing applied and Biopatch. Post procedure assessment: normal and unchanged  Patient tolerated the procedure well with no immediate complications.

## 2019-10-24 ENCOUNTER — Ambulatory Visit (HOSPITAL_COMMUNITY): Payer: Medicare Other

## 2019-10-24 ENCOUNTER — Encounter (HOSPITAL_COMMUNITY): Payer: Self-pay | Admitting: Internal Medicine

## 2019-10-24 DIAGNOSIS — I495 Sick sinus syndrome: Secondary | ICD-10-CM | POA: Diagnosis not present

## 2019-10-24 DIAGNOSIS — I129 Hypertensive chronic kidney disease with stage 1 through stage 4 chronic kidney disease, or unspecified chronic kidney disease: Secondary | ICD-10-CM | POA: Diagnosis not present

## 2019-10-24 DIAGNOSIS — Z45010 Encounter for checking and testing of cardiac pacemaker pulse generator [battery]: Secondary | ICD-10-CM | POA: Diagnosis not present

## 2019-10-24 DIAGNOSIS — I480 Paroxysmal atrial fibrillation: Secondary | ICD-10-CM

## 2019-10-24 LAB — BASIC METABOLIC PANEL
Anion gap: 6 (ref 5–15)
BUN: 12 mg/dL (ref 8–23)
CO2: 24 mmol/L (ref 22–32)
Calcium: 8.9 mg/dL (ref 8.9–10.3)
Chloride: 107 mmol/L (ref 98–111)
Creatinine, Ser: 1.06 mg/dL (ref 0.61–1.24)
GFR calc Af Amer: >60 mL/min (ref >60)
GFR calc non Af Amer: >60 mL/min (ref >60)
Glucose, Bld: 105 mg/dL — ABNORMAL HIGH (ref 70–99)
Potassium: 3.9 mmol/L (ref 3.5–5.1)
Sodium: 137 mmol/L (ref 135–145)

## 2019-10-24 MED FILL — Heparin Sod (Porcine)-NaCl IV Soln 1000 Unit/500ML-0.9%: INTRAVENOUS | Qty: 3500 | Status: AC

## 2019-10-24 NOTE — Discharge Instructions (Signed)
No driving for 4 days. No lifting over 5 lbs for 1 week. No sexual activity for 1 week. You may return to work in 1 week. Keep procedure site clean & dry. If you notice increased pain, swelling, bleeding or pus, call/return!  You may shower, but no soaking baths/hot tubs/pools for 1 week.  ° ° °

## 2019-10-24 NOTE — Discharge Summary (Signed)
ELECTROPHYSIOLOGY PROCEDURE DISCHARGE SUMMARY    Patient ID: Marco Morris,  MRN: [id number], DOB/AGE: 09-28-1955 64 y.o.  Admit date: 10/23/2019 Discharge date: 10/24/2019  Primary Care Physician: Jilda Panda, MD Electrophysiologist: Allred  Primary Discharge Diagnosis:  Symptomatic bradycardia with previously implanted leadless pacemaker with early battery depletion status post removal of previously implanted leadless pacemaker and re-implantation this admission  Secondary Discharge Diagnosis:  1.  Paroxysmal atrial fibrillation 2.  HTN 3.  Hypothyroidism 4.  CKD  Allergies  Allergen Reactions   Codeine Nausea And Vomiting     Procedures This Admission:  1.  Extraction of previously implanted STJ leadless pacemaker and re-implantation of STJ leadless pacemaker on 10/23/19 by Dr Rayann Heman. See op note for full details. 2.  CXR on 10/25/19 demonstrated no pneumothorax status post device implantation.   Brief HPI: Marco Morris is a 64 y.o. male with tachy brady syndrome and previously implanted STJ leadless pacemaker. His device was found to have early battery depletion.   Risks, benefits, and alternatives to PPM explant and re-implantation were reviewed with the patient who wished to proceed.   Hospital Course:  The patient was admitted and underwent implantation of a STJ leadless pacemaker with details as outlined above.  He  was monitored on telemetry overnight which demonstrated SR.  Groin was without hematoma or ecchymosis.  The device was interrogated and found to be functioning normally.  CXR was obtained and demonstrated no pneumothorax status post device implantation.  Wound care, arm mobility, and restrictions were reviewed with the patient.  The patient was examined and considered stable for discharge to home.    Physical Exam: Vitals:   10/23/19 2100 10/23/19 2357 10/24/19 0510 10/24/19 0753  BP: 120/74 120/71 131/80   Pulse: 70 66 62   Resp: 18 16 15      Temp: 98.4 F (36.9 C) 98.1 F (36.7 C) 98.3 F (36.8 C)   TempSrc: Oral Oral Oral   SpO2: 96% 99% 98% 99%  Weight:   85.4 kg   Height:        GEN- The patient is well appearing, alert and oriented x 3 today.   HEENT: normocephalic, atraumatic; sclera clear, conjunctiva pink; hearing intact; oropharynx clear; neck supple  Lungs-normal work of breathing.   Heart- Regular rate and rhythm  GI- soft, non-tender, non-distended, bowel sounds present, no hepatosplenomegaly Extremities- no clubbing, cyanosis, or edema  Skin- warm and dry, no rash or lesion, left chest without hematoma/ecchymosis Psych- euthymic mood, full affect Neuro- strength and sensation are intact   Labs:   Lab Results  Component Value Date   WBC 6.4 10/20/2019   HGB 14.8 10/20/2019   HCT 43.0 10/20/2019   MCV 86 10/20/2019   PLT 153 10/20/2019    Recent Labs  Lab 10/20/19 0954  NA 138  K 4.6  CL 100  CO2 28  BUN 14  CREATININE 1.16  CALCIUM 9.9  GLUCOSE 117*    Discharge Medications:  Allergies as of 10/24/2019      Reactions   Codeine Nausea And Vomiting      Medication List    TAKE these medications   Advair HFA 230-21 MCG/ACT inhaler Generic drug: fluticasone-salmeterol USE 2 INHALATIONS TWICE A DAY   atorvastatin 20 MG tablet Commonly known as: LIPITOR TAKE 1 TABLET EVERY EVENING   Centrum Silver 50+Men Tabs Take 1 tablet by mouth daily after breakfast.   PRESERVISION AREDS 2 PO Take 1 tablet by mouth 2 (two)  times a day.   Klor-Con M20 20 MEQ tablet Generic drug: potassium chloride SA Take 20 mEq by mouth daily after breakfast.   levocetirizine 5 MG tablet Commonly known as: XYZAL Take 5 mg by mouth daily after breakfast.   levothyroxine 125 MCG tablet Commonly known as: SYNTHROID Take 125 mcg by mouth daily before breakfast.   lisinopril 10 MG tablet Commonly known as: ZESTRIL TAKE 1 TABLET (10 MG TOTAL) BY MOUTH DAILY. What changed:   how much to take  how to  take this  when to take this   omeprazole 20 MG capsule Commonly known as: PRILOSEC TAKE 1 CAPSULE BY MOUTH EVERY DAY What changed: how much to take   ProAir HFA 108 (90 Base) MCG/ACT inhaler Generic drug: albuterol Inhale 2 puffs into the lungs every 4 (four) hours as needed for wheezing or shortness of breath.   rivaroxaban 20 MG Tabs tablet Commonly known as: Xarelto Take 1 tablet (20 mg total) by mouth daily with supper.   Spiriva Respimat 2.5 MCG/ACT Aers Generic drug: Tiotropium Bromide Monohydrate Inhale 2 puffs into the lungs daily. 2 puff once per day       Disposition:    Follow-up Information    [facility] Follow up on 11/11/2019.   Specialty: Cardiology Why: at 12 noon Contact information: [address] [place]       [name], MD Follow up on 01/26/2020.   Specialty: Cardiology Why: at 2:15PM  Contact information: [place] [address] [phone]               Duration of Discharge Encounter: Greater than 30 minutes including physician time.  Signed, Chanetta Marshall, NP 10/24/2019 8:17 AM

## 2019-10-24 NOTE — Research (Signed)
[name] Informed Consent   Subject Name: Marco Morris  Subject met inclusion and exclusion criteria.  The informed consent form, study requirements and expectations were reviewed with the subject and questions and concerns were addressed prior to the signing of the consent form.  The subject verbalized understanding of the trail requirements.  The subject agreed to participate in the [place] trial and signed the informed consent.  The informed consent was obtained prior to performance of any protocol-specific procedures for the subject.  A copy of the signed informed consent was given to the subject and a copy was placed in the subject's medical record.  [name]. 10-23-2019, 12:10 PM

## 2019-10-24 NOTE — Research (Deleted)
[place] Informed Consent   Subject Name: Marco Morris  Subject met inclusion and exclusion criteria.  The informed consent form, study requirements and expectations were reviewed with the subject and questions and concerns were addressed prior to the signing of the consent form.  The subject verbalized understanding of the trail requirements.  The subject agreed to participate in the LEADLESS II-AVEIR trial and signed the informed consent.  The informed consent was obtained prior to performance of any protocol-specific procedures for the subject.  A copy of the signed informed consent was given to the subject and a copy was placed in the subject's medical record.  [name]. 10-23-2019, 12:10 AM

## 2019-10-27 ENCOUNTER — Telehealth: Payer: Self-pay | Admitting: Internal Medicine

## 2019-10-27 ENCOUNTER — Encounter (HOSPITAL_COMMUNITY): Payer: Self-pay | Admitting: Internal Medicine

## 2019-10-27 NOTE — Telephone Encounter (Signed)
Patient requesting a call back from Morrison. He states there is no message he wants me to get to her except he just needs something filled out and would like to discuss with Sonia Baller.

## 2019-10-27 NOTE — Telephone Encounter (Signed)
Pt has FMLA paperwork he needs completed before he can return to work.  States "as long as it's ready by Friday I am ok"  Pt will drop off paper work today.

## 2019-10-30 NOTE — Telephone Encounter (Signed)
Paper work completed.  Call placed to Pt to advise paperwork available for pick up in lobby.  Pt indicates understanding.

## 2019-11-04 ENCOUNTER — Ambulatory Visit: Payer: TRICARE For Life (TFL)

## 2019-11-11 ENCOUNTER — Ambulatory Visit (INDEPENDENT_AMBULATORY_CARE_PROVIDER_SITE_OTHER): Payer: Medicare Other | Admitting: Emergency Medicine

## 2019-11-11 ENCOUNTER — Other Ambulatory Visit: Payer: Self-pay

## 2019-11-11 ENCOUNTER — Encounter: Payer: Medicare Other | Admitting: *Deleted

## 2019-11-11 DIAGNOSIS — I495 Sick sinus syndrome: Secondary | ICD-10-CM | POA: Diagnosis not present

## 2019-11-11 DIAGNOSIS — Z950 Presence of cardiac pacemaker: Secondary | ICD-10-CM | POA: Diagnosis not present

## 2019-11-11 DIAGNOSIS — Z006 Encounter for examination for normal comparison and control in clinical research program: Secondary | ICD-10-CM

## 2019-11-11 LAB — CUP PACEART INCLINIC DEVICE CHECK
Brady Statistic RV Percent Paced: 0 %
Date Time Interrogation Session: 20210629132319
Implantable Pulse Generator Implant Date: 20210610
Lead Channel Pacing Threshold Amplitude: 0.5 V
Lead Channel Pacing Threshold Pulse Width: 0.4 ms
Lead Channel Sensing Intrinsic Amplitude: 14 mV
Pulse Gen Serial Number: 1304509

## 2019-11-11 NOTE — Progress Notes (Signed)
Wound check in clinic. Groin site well healed, no evidence of hematoma or ecchymosis. Device check performed by industry and research. Normal device function. RV output remains at 3.5V @ 0.12ms until 3 month f/u. No changes. See scanned report for details. ROV with Dr. Rayann Heman on 01/26/20.

## 2019-11-11 NOTE — Research (Signed)
Date of assessment:    __06-29-2021___                 Site ID / Name : [id number]  [facility] Patient ID / Site patient ID: _[id number]___  I.  PHYSICIAN NAME:  Last Name:  Allred           First Name:   Jeneen Rinks              Middle Intial:   II. VISIT TYPE:  []  2 wk  []  6 wk  []   3 mo  []  6 mo  []  12 mo  []  18 mo []  24 mo []  30 mo  []   36 mo                   []  42 mo  []  48 mo  []   54 mo  []   60 mo  []   66 mo  []   72 mo [x]  78 mo  []  84 mo                            []  90 mo  []  96 mo []  102 mo        []  Unscheduled visit; IF unscheduled, check one;    []  In Clinic   []  Remote        []  Other type visit; Specify: ____________________       1. Cardiac Drug Therapy:     []  None              Check all that apply, If a drug is a combination drug, check both categories.                Drug Category                         Drug Names (Examples)       []  Antiarrhythmics (Class I)      Disopyramide / Norpace  Flecainide / Tambocor  Mexiletine  Procainamide / Pronestyl, Procan SR  Propafenone / Rythmol  Quinidine      []  Antiarrhythmics (Class III)   Amiodarone / Pacerone  Dofetilide / Tikosyn  Dronedarone / Multaq  Ibutilide / Covert  Sotalol / Betapace     [x]  Anticoagulants  Ticagrelor / Brilinta  Apixaban / Eliquis  Dabigatran / Pradaxa  Rivaroxaban / Xarelto  XX  Edoxaban / Savaysa  Fondaparinux / Arixtra  Enoxaparin / Lovenox  Coumadin / Warfarin  Heparin      []  Anti-platelets  Acetylsalicylic acid / Aspirin  Clopidogrel / Plavix   Prasugrel / Effient  Ticlopidine / Ticlid  Cilostazol / Pletal   Dipyridamole / Persantine  Aggrostat    [x]  ACE inhibitors  Benazepril / Lotensin  Captopril / Capoten  Enalapril / Vasotec  Lisinopril / Prinivil, Zestril  XX  Quinapril / Accupril  Ramipril / Altace  Trandolapril / Mavik    []  ARBs (Angiotensin II recep.blocker)  Candesartan / Atacand  Eprosartan / Teveten  Irbesartan /  Avapro  Losartan / Cozaar  Olmesartan / Benicar   Telmisartan / Micardis  Valsartan / Diovan    []   Beta Blockers  Acebutolol / Sectral  Atenolol / Tenormin  Bisoprolol / Zebeta  Carvedilol / Coreg  Esmolol / Brevibloc  Labetalol / Normodyne  Metoprolol / Lopressor, Toprol  Nadolol / Corgard  Nebivolol / Bystolic  Propranolol / Inderal  Timolol / Blocadren                2. Has the subject experienced an Adverse Event since last assessment?                      []  Yes,   If yes, please complete an Adverse Event Form                     [x]  No                      Assessments:   AVEIR Leadless Pacemaker (LP) Assessment and Programming (All visits)   [x]  Yes   []  No   If No, please specify: _________________   And submit a deviation form   24- Hour Holter Monitor performed?                                                                   Not applicable for Phase II    Graded Exercise Test (CAEP) performed?   []  Yes [x]  No []  NA    EQ-5D Patient Survey performed?   []  Yes [x]  No []  NA    COMMENTS:   This visit was also his 2 week postop wound check appointment from Nanostim explant /AVEIR implant procedure on 10-23-2019 by Dr. Rayann Heman. Patient has done well since surgery. He has started walking a mile each day, has returned to driving and is swimming. His groin is c/d/i and he reports having no pain or fevers.                                    "CONSENT"   YES     NO   Continuing further Investigational Product and study visits for follow-up? [x]  []   Continuing consent from future biomedical research [x]  []                                      "EVENTS"    YES     NO  AE   (IF YES SEE SOURCE) []  [x]   SAE  (IF YES SEE SOURCE) []  [x]   ENDPOINT   (IF YES SEE SOURCE) []  [x]   HOSPITALIZATIONS OR URGENT CARE VISITS    10-23-2019 Explant/ Implant - patient stayed overnight for observation. [x]  []   PROTOCOL DEVIATIONS []  [x]    []  []

## 2019-11-18 ENCOUNTER — Other Ambulatory Visit: Payer: Self-pay | Admitting: Nurse Practitioner

## 2019-11-18 NOTE — Telephone Encounter (Signed)
Prescription refill request for Xarelto received.   Last office visit: Allred, 06/23/2019 Weight: 85.4 kg Age: 64 y.o. Scr: 1.06, 10/24/2019 CrCl: 82 ml/min   Prescription refill sent.

## 2019-11-21 ENCOUNTER — Other Ambulatory Visit: Payer: Self-pay | Admitting: Internal Medicine

## 2020-01-02 ENCOUNTER — Other Ambulatory Visit: Payer: Self-pay | Admitting: Nurse Practitioner

## 2020-01-12 ENCOUNTER — Other Ambulatory Visit: Payer: Self-pay | Admitting: *Deleted

## 2020-01-12 DIAGNOSIS — J449 Chronic obstructive pulmonary disease, unspecified: Secondary | ICD-10-CM

## 2020-01-13 ENCOUNTER — Ambulatory Visit (INDEPENDENT_AMBULATORY_CARE_PROVIDER_SITE_OTHER): Payer: Medicare Other | Admitting: Pulmonary Disease

## 2020-01-13 ENCOUNTER — Other Ambulatory Visit: Payer: Self-pay

## 2020-01-13 ENCOUNTER — Encounter: Payer: Self-pay | Admitting: *Deleted

## 2020-01-13 VITALS — BP 140/80 | HR 80 | Temp 97.3°F | Ht 72.0 in | Wt 201.0 lb

## 2020-01-13 DIAGNOSIS — J449 Chronic obstructive pulmonary disease, unspecified: Secondary | ICD-10-CM

## 2020-01-13 DIAGNOSIS — J4540 Moderate persistent asthma, uncomplicated: Secondary | ICD-10-CM

## 2020-01-13 LAB — PULMONARY FUNCTION TEST
DL/VA % pred: 103 %
DL/VA: 4.22 ml/min/mmHg/L
DLCO cor % pred: 98 %
DLCO cor: 27.85 ml/min/mmHg
DLCO unc % pred: 98 %
DLCO unc: 27.85 ml/min/mmHg
FEF 25-75 Post: 5.01 L/sec
FEF 25-75 Pre: 3.08 L/sec
FEF2575-%Change-Post: 62 %
FEF2575-%Pred-Post: 174 %
FEF2575-%Pred-Pre: 107 %
FEV1-%Change-Post: 14 %
FEV1-%Pred-Post: 99 %
FEV1-%Pred-Pre: 87 %
FEV1-Post: 3.66 L
FEV1-Pre: 3.18 L
FEV1FVC-%Change-Post: 2 %
FEV1FVC-%Pred-Pre: 106 %
FEV6-%Change-Post: 11 %
FEV6-%Pred-Post: 96 %
FEV6-%Pred-Pre: 86 %
FEV6-Post: 4.49 L
FEV6-Pre: 4.02 L
FEV6FVC-%Change-Post: 0 %
FEV6FVC-%Pred-Post: 104 %
FEV6FVC-%Pred-Pre: 105 %
FVC-%Change-Post: 12 %
FVC-%Pred-Post: 91 %
FVC-%Pred-Pre: 81 %
FVC-Post: 4.51 L
FVC-Pre: 4.02 L
Post FEV1/FVC ratio: 81 %
Post FEV6/FVC ratio: 100 %
Pre FEV1/FVC ratio: 79 %
Pre FEV6/FVC Ratio: 100 %
RV % pred: 111 %
RV: 2.77 L
TLC % pred: 95 %
TLC: 7.1 L

## 2020-01-13 LAB — CBC WITH DIFFERENTIAL/PLATELET
Basophils Absolute: 0 10*3/uL (ref 0.0–0.1)
Basophils Relative: 0.6 % (ref 0.0–3.0)
Eosinophils Absolute: 0.5 10*3/uL (ref 0.0–0.7)
Eosinophils Relative: 8.3 % — ABNORMAL HIGH (ref 0.0–5.0)
HCT: 43.1 % (ref 39.0–52.0)
Hemoglobin: 14.5 g/dL (ref 13.0–17.0)
Lymphocytes Relative: 30.4 % (ref 12.0–46.0)
Lymphs Abs: 2 10*3/uL (ref 0.7–4.0)
MCHC: 33.6 g/dL (ref 30.0–36.0)
MCV: 88.4 fl (ref 78.0–100.0)
Monocytes Absolute: 0.6 10*3/uL (ref 0.1–1.0)
Monocytes Relative: 9.7 % (ref 3.0–12.0)
Neutro Abs: 3.3 10*3/uL (ref 1.4–7.7)
Neutrophils Relative %: 51 % (ref 43.0–77.0)
Platelets: 138 10*3/uL — ABNORMAL LOW (ref 150.0–400.0)
RBC: 4.88 Mil/uL (ref 4.22–5.81)
RDW: 14.8 % (ref 11.5–15.5)
WBC: 6.5 10*3/uL (ref 4.0–10.5)

## 2020-01-13 MED ORDER — BUDESONIDE-FORMOTEROL FUMARATE 160-4.5 MCG/ACT IN AERO
2.0000 | INHALATION_SPRAY | Freq: Two times a day (BID) | RESPIRATORY_TRACT | 6 refills | Status: DC
Start: 2020-01-13 — End: 2020-05-31

## 2020-01-13 NOTE — Progress Notes (Signed)
Marco Morris    [id number]    04-Dec-1955  Primary Care Physician:Moreira, Carloyn Manner, MD  Referring Physician: Jilda Panda, MD [id number] [place],  [place] [address]  Chief complaint: Follow-up for COPD, asthma  HPI: 64 year old with history of atrial fibrillation, hyperlipidemia, hypothyroidism, hypertension. He has complaints of dyspnea with activity and rest for the past several years which is getting worse. He has non-productive cough with no sputum production. Denies any wheeze, fevers, chills. He has history of atrial fibrillation status post ablation in 2015 and has been stable since then. He has history of GERD. Symptoms are well controlled on Zantac. There are no seasonal allergies, postnasal drip. He is physically active and does regular exercise with running and swimming.  Pets:None Occupation: Administrator Exposures: None. No mold at home Smoking history:15-pack-year smoking history. Quit in 1979 Travel History: No recent travel except for work  Interim history: Continues on Advair, Spiriva States that his breathing got worse after he was changed from Advair [facility] to [facility].  Using his albuterol inhaler several times a day. He had tried Trelegy in the past but did not feel it worked well Denies any wheezing, nocturnal awakenings  In spite of the symptoms he continues to have an active lifestyle running a couple of miles every other day, swimming and exercising. Recently had a pacemaker placed  Outpatient Encounter Medications as of 01/13/2020  Medication Sig  . ADVAIR HFA 230-21 MCG/ACT inhaler USE 2 INHALATIONS TWICE A DAY  . albuterol (PROAIR HFA) 108 (90 Base) MCG/ACT inhaler Inhale 2 puffs into the lungs every 4 (four) hours as needed for wheezing or shortness of breath.  Marland Kitchen atorvastatin (LIPITOR) 20 MG tablet TAKE 1 TABLET EVERY EVENING (Patient taking differently: Take 20 mg by mouth every evening. )  . levocetirizine (XYZAL) 5 MG tablet Take 5 mg by  mouth daily after breakfast.   . levothyroxine (SYNTHROID, LEVOTHROID) 125 MCG tablet Take 125 mcg by mouth daily before breakfast.   . lisinopril (PRINIVIL,ZESTRIL) 10 MG tablet TAKE 1 TABLET (10 MG TOTAL) BY MOUTH DAILY. (Patient taking differently: Take 10 mg by mouth daily. TAKE 1 TABLET (10 MG TOTAL) BY MOUTH DAILY.)  . Multiple Vitamins-Minerals (CENTRUM SILVER 50+MEN) TABS Take 1 tablet by mouth daily after breakfast.   . Multiple Vitamins-Minerals (PRESERVISION AREDS 2 PO) Take 1 tablet by mouth 2 (two) times a day.  Marland Kitchen omeprazole (PRILOSEC) 20 MG capsule TAKE 1 CAPSULE BY MOUTH EVERY DAY  . potassium chloride SA (KLOR-CON M20) 20 MEQ tablet Take 20 mEq by mouth daily after breakfast.   . Tiotropium Bromide Monohydrate (SPIRIVA RESPIMAT) 2.5 MCG/ACT AERS Inhale 2 puffs into the lungs daily. 2 puff once per day   . XARELTO 20 MG TABS tablet TAKE 1 TABLET DAILY WITH SUPPER   No facility-administered encounter medications on file as of 01/13/2020.   Physical Exam: Blood pressure 140/80, pulse 80, temperature (!) 97.3 F (36.3 C), temperature source Temporal, height 6' (1.829 m), weight 201 lb (91.2 kg), SpO2 99 %. Gen:      No acute distress HEENT:  EOMI, sclera anicteric Neck:     No masses; no thyromegaly Lungs:    Clear to auscultation bilaterally; normal respiratory effort CV:         Regular rate and rhythm; no murmurs Abd:      + bowel sounds; soft, non-tender; no palpable masses, no distension Ext:    No edema; adequate peripheral perfusion Skin:  Warm and dry; no rash Neuro: alert and oriented x 3 Psych: normal mood and affect  Data Reviewed: Imaging: CT chest 04/26/01-no lung mass, infiltrate, adenopathy. Early emphysematous changes. CT abdomen pelvis 03/03/13-lung images are clear. Chest x-ray 10/22/2019-no active cardiopulmonary disease. I reviewed all images personally  PFTs  02/05/17  FVC 4.46 (89%], FEV1 3.54 (94%), F/F 79, TLC 104%, DLCO 80% Minimal obstruction  and diffusion defect with significant bronchodilator response. Mild air trapping.  01/13/2020 FVC 4.51 [91%], FEV1 2.66 [9 9%], F/F 81, TLC 7.10 [95%], DLCO 27.85 [98%] No obstruction, bronchodilator response.  FENO 02/28/17- 23  Labs: CBC 10/22/2019-WBC 6.4, eos 8%, absolute eosinophil count 512 Alpha-1 antitrypsin 02/28/2017-136, PI MM   Assessment:  COPD GOLD B (CAT score 18, no exacerbations) Asthma Has COPD, asthma overlap syndrome but I suspect COPD is no significant as he has mild emphysematous changes and minimal smoking history PFTs today reviewed with improvement in obstruction but he still has bronchodilator response  Change Advair HFA to Symbicort Continue Spiriva, Xyzal Recheck CBC with differential, IgE  If he continues to be symptomatic then consider biologic therapy.  Plan/Recommendations: Change Advair to Symbicort, continue Spiriva, Xyzal CBC with differential, IgE   Marshell Garfinkel MD [facility] Pager [phone] 01/13/2020, 10:25 AM  CC: Jilda Panda, MD

## 2020-01-13 NOTE — Addendum Note (Signed)
Addended by: Vanessa Barbara on: 01/13/2020 11:02 AM   Modules accepted: Orders

## 2020-01-13 NOTE — Patient Instructions (Signed)
Continue the Spiriva We will change Advair HFA to Symbicort 160 twice daily Continue Xyzal  Check CBC differential, IgE Follow-up in 2 to 3 months.

## 2020-01-13 NOTE — Progress Notes (Signed)
Full PFT performed today. °

## 2020-01-13 NOTE — Addendum Note (Signed)
Addended by: Suzzanne Cloud E on: 01/13/2020 11:06 AM   Modules accepted: Orders

## 2020-01-14 LAB — IGE: IgE (Immunoglobulin E), Serum: 4 kU/L (ref <=114)

## 2020-01-22 ENCOUNTER — Telehealth: Payer: Self-pay | Admitting: Pulmonary Disease

## 2020-01-22 NOTE — Telephone Encounter (Signed)
Pt returning call - [phone]

## 2020-01-22 NOTE — Telephone Encounter (Signed)
Assessment:  COPD GOLD B (CAT score 18, no exacerbations) Asthma Has COPD, asthma overlap syndrome but I suspect COPD is no significant as he has mild emphysematous changes and minimal smoking history PFTs today reviewed with improvement in obstruction but he still has bronchodilator response  Change Advair HFA to Symbicort Continue Spiriva, Xyzal Recheck CBC with differential, IgE  If he continues to be symptomatic then consider biologic therapy.  Plan/Recommendations: Change Advair to Symbicort, continue Spiriva, Xyzal CBC with differential, IgE   Symbicort was sent to [facility] after pt's OV with  Dr. Vaughan Browner 8/31.  Attempted to call pt but unable to reach. Left message for him to return call.

## 2020-01-22 NOTE — Telephone Encounter (Signed)
Called and spoke with pt letting him know which inhaler had been sent to [facility] for him after last OV and he verbalized understanding. Stated to him to stop using Advair and to continue to use the Spiriva with the Symbicort and he verbalized understanding.  Also scheduled pt for a f/u with Dr. Vaughan Browner. Nothing further needed.

## 2020-01-26 ENCOUNTER — Encounter: Payer: Medicare Other | Admitting: *Deleted

## 2020-01-26 ENCOUNTER — Encounter: Payer: Self-pay | Admitting: Internal Medicine

## 2020-01-26 ENCOUNTER — Ambulatory Visit (INDEPENDENT_AMBULATORY_CARE_PROVIDER_SITE_OTHER): Payer: Medicare Other | Admitting: Internal Medicine

## 2020-01-26 ENCOUNTER — Other Ambulatory Visit: Payer: Self-pay

## 2020-01-26 VITALS — BP 122/78 | HR 76 | Ht 72.0 in | Wt 198.6 lb

## 2020-01-26 DIAGNOSIS — I480 Paroxysmal atrial fibrillation: Secondary | ICD-10-CM

## 2020-01-26 DIAGNOSIS — I495 Sick sinus syndrome: Secondary | ICD-10-CM | POA: Diagnosis not present

## 2020-01-26 DIAGNOSIS — I10 Essential (primary) hypertension: Secondary | ICD-10-CM | POA: Diagnosis not present

## 2020-01-26 DIAGNOSIS — D6869 Other thrombophilia: Secondary | ICD-10-CM | POA: Diagnosis not present

## 2020-01-26 DIAGNOSIS — Z006 Encounter for examination for normal comparison and control in clinical research program: Secondary | ICD-10-CM

## 2020-01-26 NOTE — Progress Notes (Signed)
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  Since last being seen in our clinic, the patient reports doing very well.  Today, he denies symptoms of palpitations, chest pain, shortness of breath,  lower extremity edema, dizziness, presyncope, or syncope.  The patient is otherwise without complaint today.   Past Medical History:  Diagnosis Date  . Blood dyscrasia    high risk for blood clot formation  . Chronic kidney disease   . History of renal calculi   . Hypercholesterolemia   . Hypertension   . Hypothyroidism   . Neuromuscular disorder (HCC)    numbness left foot  . Pacemaker battery depletion   . PAF (paroxysmal atrial fibrillation) (HCC)    a.  anticoagulated with Xarelto b. s/p Tikosyn loading 07/2013 c. s/p PVI  . Symptomatic bradycardia   . Tachycardia-bradycardia syndrome ([place])    a. post termination pauses b. s/p STJ leadless pacemaker   Past Surgical History:  Procedure Laterality Date  . ATRIAL FIBRILLATION ABLATION [name] 10/09/2013   Procedure: ATRIAL FIBRILLATION ABLATION;  Surgeon: Coralyn Mark, MD;  Location: [facility] CATH LAB;  Service: Cardiovascular;  Laterality: [name];  . CHOLECYSTECTOMY  ~ 2000  . LITHOTRIPSY  2004; ~ 05/2013  . PACEMAKER IMPLANT [name] 10/23/2019   Procedure: PACEMAKER IMPLANT;  Surgeon: [name], MD;  Location: [facility] CV LAB;  Service: Cardiovascular;  Laterality: [name];  . PERMANENT PACEMAKER INSERTION [name] 07/18/2013   STJ Leadless pacemaker implanted by Dr Rayann Heman for symptomatic pauses  . PPM GENERATOR REMOVAL [name] 10/23/2019   Procedure: PPM GENERATOR REMOVAL;  Surgeon: [name], MD;  Location: [facility] CV LAB;  Service: Cardiovascular;  Laterality: [name];  . TEE WITHOUT CARDIOVERSION [name] 10/08/2013   Procedure: TRANSESOPHAGEAL ECHOCARDIOGRAM (TEE);  Surgeon: Sanda Klein, MD;  Location: [facility] ENDOSCOPY;  Service: Cardiovascular;   Laterality: [name];    ROS- all systems are reviewed and negative except as per HPI above  Current Outpatient Medications  Medication Sig Dispense Refill  . albuterol (PROAIR HFA) 108 (90 Base) MCG/ACT inhaler Inhale 2 puffs into the lungs every 4 (four) hours as needed for wheezing or shortness of breath.    Marland Kitchen atorvastatin (LIPITOR) 20 MG tablet TAKE 1 TABLET EVERY EVENING 90 tablet 3  . budesonide-formoterol (SYMBICORT) 160-4.5 MCG/ACT inhaler Inhale 2 puffs into the lungs 2 (two) times daily. 1 each 6  . levocetirizine (XYZAL) 5 MG tablet Take 5 mg by mouth daily after breakfast.     . levothyroxine (SYNTHROID, LEVOTHROID) 125 MCG tablet Take 125 mcg by mouth daily before breakfast.     . lisinopril (PRINIVIL,ZESTRIL) 10 MG tablet TAKE 1 TABLET (10 MG TOTAL) BY MOUTH DAILY. 30 tablet 0  . Multiple Vitamins-Minerals (CENTRUM SILVER 50+MEN) TABS Take 1 tablet by mouth daily after breakfast.     . Multiple Vitamins-Minerals (PRESERVISION AREDS 2 PO) Take 1 tablet by mouth 2 (two) times a day.    Marland Kitchen omeprazole (PRILOSEC) 20 MG capsule TAKE 1 CAPSULE BY MOUTH EVERY DAY 30 capsule 8  . potassium chloride SA (KLOR-CON M20) 20 MEQ tablet Take 20 mEq by mouth daily after breakfast.     . Tiotropium Bromide Monohydrate (SPIRIVA RESPIMAT) 2.5 MCG/ACT AERS Inhale 2 puffs into the lungs daily. 2 puff once per day     . XARELTO 20 MG TABS tablet TAKE 1 TABLET DAILY WITH SUPPER 90 tablet 1  No current facility-administered medications for this visit.    Physical Exam: Vitals:   01/26/20 1420  BP: 122/78  Pulse: 76  SpO2: 93%  Weight: 198 lb 9.6 oz (90.1 kg)  Height: 6' (1.829 m)    GEN- The patient is well appearing, alert and oriented x 3 today.   Head- normocephalic, atraumatic Eyes-  Sclera clear, conjunctiva pink Ears- hearing intact Oropharynx- clear Lungs- Clear to ausculation bilaterally, normal work of breathing Heart- Regular rate and rhythm, no murmurs, rubs or gallops, PMI not  laterally displaced GI- soft, NT, ND, + BS Extremities- no clubbing, cyanosis, or edema  Pacemaker interrogation- reviewed in detail today,  See PACEART report  ekg tracing ordered today is personally reviewed and shows sinus  Assessment and Plan:  1. Symptomatic sinus bradycardia   He has done well s/p prior nanostim device removal and implantation of a new Abbot leadless pacemaker Normal pacemaker function See Pace Art report No changes today he is not device dependant today  2. Paroxysmal atrial fibrillation chads2vasc score is 2.  He is on xarelto afib is well controlled post ablation off AAD therapy  3. HTN Stable No change required today  4. COPD Stable No change required today  Return to see EP PA every 6 months  Risks, benefits and potential toxicities for medications prescribed and/or refilled reviewed with patient today.   [name] MD, [facility] 01/26/2020 2:32 PM

## 2020-01-26 NOTE — Patient Instructions (Addendum)
Medication Instructions:  Your physician recommends that you continue on your current medications as directed. Please refer to the Current Medication list given to you today.  *If you need a refill on your cardiac medications before your next appointment, please call your pharmacy*  Lab Work: None ordered.  If you have labs (blood work) drawn today and your tests are completely normal, you will receive your results only by: . MyChart Message (if you have MyChart) OR . A paper copy in the mail If you have any lab test that is abnormal or we need to change your treatment, we will call you to review the results.  Testing/Procedures: None ordered.  Follow-Up: At [facility], you and your health needs are our priority.  As part of our continuing mission to provide you with exceptional heart care, we have created designated Provider Care Teams.  These Care Teams include your primary Cardiologist (physician) and Advanced Practice Providers (APPs -  Physician Assistants and Nurse Practitioners) who all work together to provide you with the care you need, when you need it.  We recommend signing up for the patient portal called "MyChart".  Sign up information is provided on this After Visit Summary.  MyChart is used to connect with patients for Virtual Visits (Telemedicine).  Patients are able to view lab/test results, encounter notes, upcoming appointments, etc.  Non-urgent messages can be sent to your provider as well.   To learn more about what you can do with MyChart, go to [email or web address].    Your next appointment:   Your physician wants you to follow-up in: 6 months with Renee Ursuy.  You will receive a reminder letter in the mail two months in advance. If you don't receive a letter, please call our office to schedule the follow-up appointment.    Other Instructions:  

## 2020-01-27 NOTE — Research (Addendum)
"CONSENT"   YES     NO   Continuing further Investigational Product and study visits for follow-up? [x]  []   Continuing consent from future biomedical research [x]  []                                      "EVENTS"    YES     NO  AE   (IF YES SEE SOURCE) []  [x]   SAE  (IF YES SEE SOURCE) []  [x]   ENDPOINT   (IF YES SEE SOURCE) []  [x]   HOSPITALIZATIONS OR URGENT CARE VISITS []  [x]   PROTOCOL DEVIATIONS []  [x]    []  []     Date of assessment:    01-26-2020            Site ID / Name : [id number]  [facility] Patient ID / Site patient ID: __[id number]_______  I.  PHYSICIAN NAME:  Last Name:  Allred     First Name:  Jeneen Rinks         Middle Intial:   II. VISIT TYPE:  []  2 wk  []  6 wk  []   3 mo  []  6 mo  []  12 mo  []  18 mo []  24 mo []  30 mo  []   36 mo                   []  42 mo  []  48 mo  []   54 mo  []   60 mo  []   66 mo  []   72 mo []  78 mo  []  84 mo                            []  90 mo  []  96 mo []  102 mo                         [x]  Unscheduled visit; IF unscheduled, check one;    [x]  In Clinic   []  Remote              3 month post Explant/ Impant with AVEIR         []  Other type visit; Specify: ____________________       1. Cardiac Drug Therapy:     []  None              Check all that apply, If a drug is a combination drug, check both categories.                Drug Category                         Drug Names (Examples)       []  Antiarrhythmics (Class I)      Disopyramide / Norpace  Flecainide / Tambocor  Mexiletine  Procainamide / Pronestyl, Procan SR  Propafenone / Rythmol  Quinidine      []  Antiarrhythmics (Class III)   Amiodarone / Pacerone  Dofetilide / Tikosyn  Dronedarone / Multaq  Ibutilide / Covert  Sotalol / Betapace     [x]  Anticoagulants  Ticagrelor / Brilinta  Apixaban / Eliquis  Dabigatran / Pradaxa  Rivaroxaban / Xarelto  Edoxaban / Savaysa  Fondaparinux / Arixtra  Enoxaparin / Lovenox  Coumadin / Warfarin  Heparin      []  Anti-platelets   Acetylsalicylic acid / Aspirin  Clopidogrel /  Plavix   Prasugrel / Effient  Ticlopidine / Ticlid  Cilostazol / Pletal   Dipyridamole / Persantine  Aggrostat    [x]  ACE inhibitors  Benazepril / Lotensin  Captopril / Capoten  Enalapril / Vasotec  Lisinopril / Prinivil, Zestril  Quinapril / Accupril  Ramipril / Altace  Trandolapril / Mavik    []  ARBs (Angiotensin II recep.blocker)  Candesartan / Atacand  Eprosartan / Teveten  Irbesartan /  Avapro  Losartan / Cozaar  Olmesartan / Benicar  Telmisartan / Micardis  Valsartan / Diovan    []   Beta Blockers  Acebutolol / Sectral  Atenolol / Tenormin  Bisoprolol / Zebeta  Carvedilol / Coreg  Esmolol / Brevibloc  Labetalol / Normodyne  Metoprolol / Lopressor, Toprol  Nadolol / Corgard  Nebivolol / Bystolic  Propranolol / Inderal  Timolol / Blocadren                2. Has the subject experienced an Adverse Event since last assessment?                      []  Yes,   If yes, please complete an Adverse Event Form                     [x]  No                      Assessments:   Leadless Pacemaker (LP) Assessment and Programming (All visits)       AVEIR   [x]  Yes   []  No   If No, please specify: _________________   And submit a deviation form   24- Hour Holter Monitor performed?                                                                   Not applicable for Phase II    Graded Exercise Test (CAEP) performed?   []  Yes [x]  No []  NA    EQ-5D Patient Survey performed?   []  Yes [x]  No []  NA    COMMENTS:  Seen in conjunction with Dr. Jackalyn Lombard office visit; Patient is doing well and has no complaints. He is still working as a Media planner. Patient had explant and implant of Aveir Leadless PM on 10-23-2019. This was an unscheduled study visit for his 3 month postop visit. We will resume his regular 6 month schedule in February 2022.   CRD-701 LEADLESS II STUDY (IDE)  Date of Assessment:    09-13-2021_                 Site  ID / Name : [id number]  [facility]  ID [id number] [facility] Subject ID: __[id number]_____  I.  Romie Jumper LEADLESS PACEMAKER (LP) ASSESSMENT AND PROGRAMMING      VISIT TYPE:  []  Implant []  Pre-discharge                   []  2 wk  []  6 wk  []   3 mo  []  6 mo  []  12 mo  []  18 mo []  24 mo []  30 mo  []   36 mo                   []   42 mo  []  48 mo  []   54 mo  []   60 mo  []   66 mo  []   72 mo []  78 mo  []  84 mo                            []  90 mo  []  96 mo []  102 mo                   [x]  Unscheduled visit; IF unscheduled, check one;    [x]  In Clinic   []  Remote                             3 Month post Explant of NANOSTIM / Implant of AVEIR                   []  Other type visit; Specify: ____________________     Pacemaker Parameters               Initial Value    (before measurement)                   Final Value      (after measurements)                                                   2.  Mode       Check one:                        [x]  V V I   []  V O O        []  OFF   []  V V I R     []  V V I R Passive                []  Not recorded      [x]  V V I   []  V O O        []  OFF   []  V V I R     []  V V I R Passive                []  Not recorded          3. Magnet response       [x]  ON               []  Not recorded   []  OFF      [x]  ON            []  Not recorded   []  OFF        4. Sensor Gain                       []  Not recorded           [x]  [name]                               []  Not recorded           [x]  [name]       5. Basic Rate         40  bpm     []  Not recorded                40                    bpm            []  Not recorded     6. Max sensor rate                          bpm     []  Not recorded   [x]  [name]                          bpm      []  Not recorded   [x]  [name]     7. Pulse amplitude           3.5            V           []  Not recorded                          3.5            V         []  Not recorded     8. Pulse duration /width           0.4           ms      []  Not recorded               0.4           ms     []  Not recorded     9.  R sensitivity            2.0          mV    []  Not recorded                 2.0           mV    []  Not recorded       10. Hysteresis Rate Delta                         bpm          []  Not recorded  [x]  [name]   []  OFF                           bpm           []  Not recorded  [x]  [name]     []  OFF       11. Search interval                             min     [x]  Not recorded     []  [name]                             min     [x]  Not recorded     []  [name]        12  Cycle Count  cycles     []  Not recorded     [x]  [name]                         cycles     []  Not recorded     []  [name]      13. Rate Responsive         V. Ref.       []  Low       []  Medium   []  High       [x]  OFF           []  Not recorded        []  Low       []  Medium     []  High       []  OFF           []  Not recorded         14. Base Refractory             250                  ms                       []   Not recorded             250              ms         []   Not Recorded           15. Shortest V. Ref.                                ms                       [x]   Not recorded                           ms                       [x]   Not recorded       Diagnostics/ Episodes/ Test results                    Initial Value    (before measurement/ programming changes))              16.  RV LP Reset - Lifetime                       0                                             / []  Not recorded    17.  Pace (VP)- Lifetime                                             0         %             /  []  Not recorded    18.  Battery Current                       1.4             uA          /  []  Not recorded       AVEIR LEADLESS PACEMAKER (LP Assessment and Programming (Continued)       Test Values                     Value    19.  Capture threshold                                (pulse duration at 0.4 ms)                0.5           V    []  Not recorded    []  Unable to obtain in a capture threshold *       20. R-Wave amplitude            13.0             mV    []  Not recorded    []  Unable to obtain R-wave amplitude- Check one:                                                                                                                                      []  PM Dependent     []   AVN/AVj Ablation    []   Complete Heart Block    []  Slow V. Rate    []    Other *      21. Battery Voltage           3.0              V    []  Not recorded    []  [name]    []  Unavailable                 550        ?          []  Not recorded     23. Remaining capacity to RRT                          98        %     []  Not recorded    []  [name]    []  Unavailable       22. Impedence    24. Longevity                                   23.7        [  x] Years          []  Not recorded    []  [name]    []  Unavailable   25.  During the assessment(s),did the AVEIR LP have episodes of loss of capture and / or loss    of sensing that would likely cause symptomatic effects?       []  YES   [x]  NO     Current Outpatient Medications:  .  albuterol (PROAIR HFA) 108 (90 Base) MCG/ACT inhaler, Inhale 2 puffs into the lungs every 4 (four) hours as needed for wheezing or shortness of breath., Disp: , Rfl:  .  atorvastatin (LIPITOR) 20 MG tablet, TAKE 1 TABLET EVERY EVENING, Disp: 90 tablet, Rfl: 3 .  levocetirizine (XYZAL) 5 MG tablet, Take 5 mg by mouth daily after breakfast. , Disp: , Rfl:  .  levothyroxine (SYNTHROID, LEVOTHROID) 125 MCG tablet, Take 125 mcg by mouth daily before breakfast. , Disp: , Rfl:  .  lisinopril (PRINIVIL,ZESTRIL) 10 MG tablet, TAKE 1 TABLET (10 MG TOTAL) BY MOUTH DAILY., Disp: 30 tablet, Rfl: 0 .  Multiple Vitamins-Minerals (CENTRUM SILVER 50+MEN) TABS, Take 1 tablet by mouth daily after breakfast. , Disp: , Rfl:  .  Multiple Vitamins-Minerals (PRESERVISION AREDS 2 PO), Take 1 tablet by mouth 2 (two) times a day., Disp: , Rfl:   .  omeprazole (PRILOSEC) 20 MG capsule, TAKE 1 CAPSULE BY MOUTH EVERY DAY, Disp: 30 capsule, Rfl: 8 .  potassium chloride SA (KLOR-CON M20) 20 MEQ tablet, Take 20 mEq by mouth daily after breakfast. , Disp: , Rfl:  .  Tiotropium Bromide Monohydrate (SPIRIVA RESPIMAT) 2.5 MCG/ACT AERS, Inhale 2 puffs into the lungs daily. 2 puff once per day , Disp: , Rfl:  .  budesonide-formoterol (SYMBICORT) 160-4.5 MCG/ACT inhaler, USE 2 INHALATIONS TWICE A DAY, Disp: 30.6 g, Rfl: 3 .  EPINEPHrine 0.3 mg/0.3 mL IJ SOAJ injection, Inject 0.3 mg into the muscle as needed for anaphylaxis., Disp: 1 each, Rfl: 1 .  Mepolizumab (NUCALA) 100 MG/ML SOAJ, Inject 100 mg into the skin every 28 (twenty-eight) days., Disp: 1 mL, Rfl: 11 .  XARELTO 20 MG TABS tablet, TAKE 1 TABLET DAILY WITH SUPPER, Disp: 90 tablet, Rfl: 3

## 2020-01-28 NOTE — Addendum Note (Signed)
Addended by: Rose Phi on: 01/28/2020 05:01 PM   Modules accepted: Orders

## 2020-02-10 LAB — HM DIABETES EYE EXAM

## 2020-03-10 ENCOUNTER — Other Ambulatory Visit: Payer: Self-pay

## 2020-03-10 ENCOUNTER — Encounter: Payer: Self-pay | Admitting: Pulmonary Disease

## 2020-03-10 ENCOUNTER — Ambulatory Visit (INDEPENDENT_AMBULATORY_CARE_PROVIDER_SITE_OTHER): Payer: Medicare Other | Admitting: Pulmonary Disease

## 2020-03-10 VITALS — BP 136/82 | HR 77 | Temp 97.3°F | Ht 73.0 in | Wt 199.2 lb

## 2020-03-10 DIAGNOSIS — J4550 Severe persistent asthma, uncomplicated: Secondary | ICD-10-CM | POA: Diagnosis not present

## 2020-03-10 NOTE — Progress Notes (Signed)
Marco Morris    [id number]    [birth year]  Primary Care Physician:Moreira, Carloyn Manner, MD  Referring Physician: Jilda Panda, MD [id number] [place],   [id number]  Chief complaint: Follow-up for COPD, asthma  HPI: 64 year old with history of atrial fibrillation, hyperlipidemia, hypothyroidism, hypertension. He has complaints of dyspnea with activity and rest for the past several years which is getting worse. He has non-productive cough with no sputum production. Denies any wheeze, fevers, chills. He has history of atrial fibrillation status post ablation in 2015 and has been stable since then. He has history of GERD. Symptoms are well controlled on Zantac. There are no seasonal allergies, postnasal drip. He is physically active and does regular exercise with running and swimming. In spite of the symptoms he continues to have an active lifestyle running a couple of miles every other day, swimming and exercising. Recently had a pacemaker placed  Pets:None Occupation: Truck driver Exposures: None. No mold at home Smoking history:15-pack-year smoking history. Quit in 1979 Travel History: No recent travel except for work  Interim history: At last visit Advair changed to Symbicort Continues on Spiriva and Xyzal  States that his breathing is not doing well with daily symptoms of dyspnea.  Denies any wheezing He is using his albuterol rescue inhaler several times a day.   Outpatient Encounter Medications as of 03/10/2020  Medication Sig  . albuterol (PROAIR HFA) 108 (90 Base) MCG/ACT inhaler Inhale 2 puffs into the lungs every 4 (four) hours as needed for wheezing or shortness of breath.  Marland Kitchen atorvastatin (LIPITOR) 20 MG tablet TAKE 1 TABLET EVERY EVENING  . budesonide-formoterol (SYMBICORT) 160-4.5 MCG/ACT inhaler Inhale 2 puffs into the lungs 2 (two) times daily.  Marland Kitchen levocetirizine (XYZAL) 5 MG tablet Take 5 mg by mouth daily after breakfast.   . levothyroxine (SYNTHROID,  LEVOTHROID) 125 MCG tablet Take 125 mcg by mouth daily before breakfast.   . lisinopril (PRINIVIL,ZESTRIL) 10 MG tablet TAKE 1 TABLET (10 MG TOTAL) BY MOUTH DAILY.  . Multiple Vitamins-Minerals (CENTRUM SILVER 50+MEN) TABS Take 1 tablet by mouth daily after breakfast.   . Multiple Vitamins-Minerals (PRESERVISION AREDS 2 PO) Take 1 tablet by mouth 2 (two) times a day.  Marland Kitchen omeprazole (PRILOSEC) 20 MG capsule TAKE 1 CAPSULE BY MOUTH EVERY DAY  . potassium chloride SA (KLOR-CON M20) 20 MEQ tablet Take 20 mEq by mouth daily after breakfast.   . Tiotropium Bromide Monohydrate (SPIRIVA RESPIMAT) 2.5 MCG/ACT AERS Inhale 2 puffs into the lungs daily. 2 puff once per day   . XARELTO 20 MG TABS tablet TAKE 1 TABLET DAILY WITH SUPPER   No facility-administered encounter medications on file as of 03/10/2020.   Physical Exam: Blood pressure 136/82, pulse 77, temperature (!) 97.3 F (36.3 C), temperature source Skin, height 6\' 1"  (1.854 m), weight 199 lb 3.2 oz (90.4 kg), SpO2 98 %. Gen:      No acute distress HEENT:  EOMI, sclera anicteric Neck:     No masses; no thyromegaly Lungs:    Clear to auscultation bilaterally; normal respiratory effort CV:         Regular rate and rhythm; no murmurs Abd:      + bowel sounds; soft, non-tender; no palpable masses, no distension Ext:    No edema; adequate peripheral perfusion Skin:      Warm and dry; no rash Neuro: alert and oriented x 3 Psych: normal mood and affect  Data Reviewed: Imaging: CT chest 04/26/01-no  lung mass, infiltrate, adenopathy. Early emphysematous changes. CT abdomen pelvis 03/03/13-lung images are clear. Chest x-ray 10/22/2019-no active cardiopulmonary disease. I reviewed all images personally  PFTs  02/05/17  FVC 4.46 (89%], FEV1 3.54 (94%), F/F 79, TLC 104%, DLCO 80% Minimal obstruction and diffusion defect with significant bronchodilator response. Mild air trapping.  01/13/2020 FVC 4.51 [91%], FEV1 2.66 [9 9%], F/F 81, TLC 7.10 [95%],  DLCO 27.85 [98%] No obstruction, bronchodilator response.  FENO 02/28/17- 23  Act score 03/10/2020-16  Labs: CBC 10/22/2019-WBC 6.4, eos 8%, absolute eosinophil count 512 Alpha-1 antitrypsin 02/28/2017-136, PI MM  Assessment:  COPD GOLD B (CAT score 18, no exacerbations) Asthma Has COPD, asthma overlap syndrome but I suspect COPD is not significant as he has mild emphysematous changes and minimal smoking history PFTs with improvement in obstruction but he still has bronchodilator response   He continues on maximal inhaled therapy with Symbicort, Spiriva Continues on Xyzal As he still continues to be symptomatic we will assess him for injection therapy with Nucala  Plan/Recommendations: Continue Symbicort, Francetta Found Start paperwork for Jerrye Beavers MD [facility] 03/10/2020, 9:47 AM  CC: Jilda Panda, MD

## 2020-03-10 NOTE — Patient Instructions (Signed)
I am sorry not feeling well with your breathing We will start paperwork for injection therapy called Nucala. We will see if you are eligible for home injections Continue the Symbicort and Spiriva for now  Follow-up in 1 to 2 months.

## 2020-03-15 ENCOUNTER — Telehealth: Payer: Self-pay | Admitting: Pulmonary Disease

## 2020-03-16 NOTE — Telephone Encounter (Signed)
Submitted a Prior Authorization request to [employer] for [employer] via Cover My Meds. Will update once we receive a response.   (KeyNira Conn) - [id number]

## 2020-03-16 NOTE — Telephone Encounter (Signed)
Pharmacy team has not received any Nucala new start paperwork for the patient. Called patient and advised we can leave a new form at the front desk for him to stop by to complete.  Will initiate Nucala Benefits investigation.

## 2020-03-17 NOTE — Telephone Encounter (Signed)
Received notification from [employer] regarding a prior authorization for [place] PEN. Authorization has been APPROVED from 02/15/20 to 03/16/21.   Authorization # [id number]  Ran test claim, patient's copay is $33.00 for 1 month supply. Patient must fill through [employer] mail order.  Called patient and advised.  Please send in prescription and coordinate new start visit. Patient will train to self- inject.  Thanks! Beatriz Chancellor, CPhT

## 2020-03-18 MED ORDER — NUCALA 100 MG/ML ~~LOC~~ SOAJ: 100.0000 mg | SUBCUTANEOUS | 11 refills | Status: DC

## 2020-03-18 NOTE — Telephone Encounter (Signed)
Nucala pen prescription sent to [employer].  Will follow up once shipment date is set.

## 2020-03-22 NOTE — Telephone Encounter (Signed)
[place]. Call went to a VM, unable to leave message to follow up on Nucala prescription. Will try again at another time.

## 2020-03-25 MED ORDER — NUCALA 100 MG/ML ~~LOC~~ SOAJ: 100.0000 mg | SUBCUTANEOUS | 11 refills | Status: DC

## 2020-03-25 NOTE — Telephone Encounter (Signed)
Nucala prescription sent to [facility], per insurance request.

## 2020-03-29 NOTE — Telephone Encounter (Signed)
Called [facility] to follow up with Nucala shipment. Nucala is currently being processed and will be available to ship to Patient home address. Will continue to follow up.

## 2020-03-31 NOTE — Telephone Encounter (Signed)
Called [facility] to follow up on Nucala shipment. Per automated recording Nucala is being processed. I was able to speak with representative.  Patient Nucala pen will be delivered 03/31/20 with [employer]. [employer] tracking # [id number] Will follow up with Patient later this week to schedule injection start.

## 2020-04-02 NOTE — Telephone Encounter (Signed)
Lattie Haw please advise if anything further is needed regarding this.  Thanks!

## 2020-04-02 NOTE — Telephone Encounter (Signed)
ATC Patient to follow up on Nucala delivery and schedule first injection appointment. LM to call back.

## 2020-04-05 MED ORDER — EPINEPHRINE 0.3 MG/0.3ML IJ SOAJ
0.3000 mg | Freq: Once | INTRAMUSCULAR | 5 refills | Status: AC
Start: 2020-04-05 — End: 2020-04-05

## 2020-04-05 NOTE — Telephone Encounter (Signed)
Patient scheduled 04/14/20 at 1430 for Nucala injection.  Office policy reviewed, Epipen prescription sent to requested [employer]. Patient is aware to call if he has any Epipen cost issues.

## 2020-04-05 NOTE — Telephone Encounter (Signed)
Pt returning call about Nucala appt.  [phone]

## 2020-04-06 ENCOUNTER — Telehealth: Payer: Self-pay | Admitting: Pulmonary Disease

## 2020-04-06 NOTE — Telephone Encounter (Signed)
lmtcb for pt.  

## 2020-04-07 NOTE — Telephone Encounter (Signed)
Called and spoke with Patient. Patient stated insurance did not cover his Epipen and he could not afford his epipen from pharmacy. Patient is concerned he will not be able to start Nucala.  Advised Patient I would contact Pharmacy Team to see if they can help with Epipen assistance.  Patient is scheduled 04/14/20 for Nucala new start.   Will  Route message to Pharmacy team to assist with Patient assistance with Epi pen cost.

## 2020-04-09 MED ORDER — EPINEPHRINE 0.3 MG/0.3ML IJ SOAJ: 0.3000 mg | INTRAMUSCULAR | 1 refills | Status: AC | PRN

## 2020-04-09 MED ORDER — EPINEPHRINE 0.3 MG/0.3ML IJ SOAJ: 0.3000 mg | INTRAMUSCULAR | 1 refills | Status: DC | PRN

## 2020-04-09 NOTE — Telephone Encounter (Signed)
Is there no other assistance? If Patient does not have Epipen, he can't start injections per office policy.

## 2020-04-09 NOTE — Telephone Encounter (Addendum)
Patient has [employer], and unfortunately the cheapest option is going to be through [facility]. I've resent prescription to [facility]. He doesn't qualify for the copay card b/c of [employer] coverage.  Knox Saliva, PharmD, MPH Clinical Pharmacist (Rheumatology and Pulmonology)

## 2020-04-09 NOTE — Telephone Encounter (Signed)
Called [facility] and they were able to run the epinephrine auto-injector through his [employer].   Copay is $33 for brand but pharmacy is running through for generic so may be cheaper.  Knox Saliva, PharmD, MPH Clinical Pharmacist (Rheumatology and Pulmonology)

## 2020-04-13 ENCOUNTER — Other Ambulatory Visit: Payer: Self-pay

## 2020-04-13 ENCOUNTER — Ambulatory Visit (INDEPENDENT_AMBULATORY_CARE_PROVIDER_SITE_OTHER): Payer: Medicare Other | Admitting: Pulmonary Disease

## 2020-04-13 ENCOUNTER — Encounter: Payer: Self-pay | Admitting: Pulmonary Disease

## 2020-04-13 VITALS — BP 138/78 | HR 79 | Temp 97.0°F | Ht 72.0 in | Wt 201.2 lb

## 2020-04-13 DIAGNOSIS — J4550 Severe persistent asthma, uncomplicated: Secondary | ICD-10-CM

## 2020-04-13 DIAGNOSIS — J449 Chronic obstructive pulmonary disease, unspecified: Secondary | ICD-10-CM

## 2020-04-13 NOTE — Patient Instructions (Signed)
Continue the inhalers as prescribed Glad you are finally starting the injection therapy tomorrow I will see back in clinic in 4 months to assess response.

## 2020-04-13 NOTE — Progress Notes (Signed)
Marco Morris    [id number]    1955-06-22  Primary Care Physician:Moreira, Carloyn Manner, MD  Referring Physician: Jilda Panda, MD [address] [place],  [place] [address]  Chief complaint: Follow-up for COPD, asthma  HPI: 64 year old with history of atrial fibrillation, hyperlipidemia, hypothyroidism, hypertension. He has complaints of dyspnea with activity and rest for the past several years which is getting worse. He has non-productive cough with no sputum production. Denies any wheeze, fevers, chills. He has history of atrial fibrillation status post ablation in 2015 and has been stable since then. He has history of GERD. Symptoms are well controlled on Zantac. There are no seasonal allergies, postnasal drip. He is physically active and does regular exercise with running and swimming. In spite of the symptoms he continues to have an active lifestyle running a couple of miles every other day, swimming and exercising. Recently had a pacemaker placed  Pets:None Occupation: Truck driver Exposures: None. No mold at home Smoking history:15-pack-year smoking history. Quit in 1979 Travel History: No recent travel except for work  Interim history: Continues on inhalers.  Continues to have daily symptoms of dyspnea.  Needs to use his albuterol inhaler several times a day Nucala is been approved and is due to start tomorrow.  States that his breathing is not doing well with daily symptoms of dyspnea.  Denies any wheezing He is using his albuterol rescue inhaler several times a day.  Outpatient Encounter Medications as of 04/13/2020  Medication Sig  . albuterol (PROAIR HFA) 108 (90 Base) MCG/ACT inhaler Inhale 2 puffs into the lungs every 4 (four) hours as needed for wheezing or shortness of breath.  Marland Kitchen atorvastatin (LIPITOR) 20 MG tablet TAKE 1 TABLET EVERY EVENING  . budesonide-formoterol (SYMBICORT) 160-4.5 MCG/ACT inhaler Inhale 2 puffs into the lungs 2 (two) times daily.  Marland Kitchen  EPINEPHrine 0.3 mg/0.3 mL IJ SOAJ injection Inject 0.3 mg into the muscle as needed for anaphylaxis.  Marland Kitchen levocetirizine (XYZAL) 5 MG tablet Take 5 mg by mouth daily after breakfast.   . levothyroxine (SYNTHROID, LEVOTHROID) 125 MCG tablet Take 125 mcg by mouth daily before breakfast.   . lisinopril (PRINIVIL,ZESTRIL) 10 MG tablet TAKE 1 TABLET (10 MG TOTAL) BY MOUTH DAILY.  . Mepolizumab (NUCALA) 100 MG/ML SOAJ Inject 100 mg into the skin every 28 (twenty-eight) days.  . Multiple Vitamins-Minerals (CENTRUM SILVER 50+MEN) TABS Take 1 tablet by mouth daily after breakfast.   . Multiple Vitamins-Minerals (PRESERVISION AREDS 2 PO) Take 1 tablet by mouth 2 (two) times a day.  Marland Kitchen omeprazole (PRILOSEC) 20 MG capsule TAKE 1 CAPSULE BY MOUTH EVERY DAY  . potassium chloride SA (KLOR-CON M20) 20 MEQ tablet Take 20 mEq by mouth daily after breakfast.   . Tiotropium Bromide Monohydrate (SPIRIVA RESPIMAT) 2.5 MCG/ACT AERS Inhale 2 puffs into the lungs daily. 2 puff once per day   . XARELTO 20 MG TABS tablet TAKE 1 TABLET DAILY WITH SUPPER   No facility-administered encounter medications on file as of 04/13/2020.   Physical Exam: Blood pressure 138/78, pulse 79, temperature (!) 97 F (36.1 C), temperature source Other (Comment), height 6' (1.829 m), weight 201 lb 3.2 oz (91.3 kg), SpO2 99 %. Gen:      No acute distress HEENT:  EOMI, sclera anicteric Neck:     No masses; no thyromegaly Lungs:    Clear to auscultation bilaterally; normal respiratory effort CV:         Regular rate and rhythm; no  murmurs Abd:      + bowel sounds; soft, non-tender; no palpable masses, no distension Ext:    No edema; adequate peripheral perfusion Skin:      Warm and dry; no rash Neuro: alert and oriented x 3 Psych: normal mood and affect  Data Reviewed: Imaging: CT chest 04/26/01-no lung mass, infiltrate, adenopathy. Early emphysematous changes. CT abdomen pelvis 03/03/13-lung images are clear. Chest x-ray 10/22/2019-no  active cardiopulmonary disease. I reviewed all images personally  PFTs  02/05/17  FVC 4.46 (89%], FEV1 3.54 (94%), F/F 79, TLC 104%, DLCO 80% Minimal obstruction and diffusion defect with significant bronchodilator response. Mild air trapping.  01/13/2020 FVC 4.51 [91%], FEV1 2.66 [9 9%], F/F 81, TLC 7.10 [95%], DLCO 27.85 [98%] No obstruction, bronchodilator response.  FENO 02/28/17- 23  Act score  03/10/2020-16 04/13/2020-16  Labs: CBC 10/22/2019-WBC 6.4, eos 8%, absolute eosinophil count 512 Alpha-1 antitrypsin 02/28/2017-136, PI MM  Assessment:  COPD GOLD B (CAT score 18, no exacerbations) Asthma Has COPD, asthma overlap syndrome but I suspect COPD is not significant as he has mild emphysematous changes and minimal smoking history PFTs with improvement in obstruction but he still has bronchodilator response   He continues on maximal inhaled therapy with Symbicort, Spiriva with persistent symptoms Continues on Xyzal  Nucala approved and is due to start tomorrow Reassess response in 3 to 4 months  Plan/Recommendations: Continue Symbicort, Spiriva, Xyzal Start Nucala tomorrow Follow-up in clinic in 3 to 4 months  Marshell Garfinkel MD [facility] 04/13/2020, 9:36 AM  CC: Jilda Panda, MD

## 2020-04-14 ENCOUNTER — Ambulatory Visit (INDEPENDENT_AMBULATORY_CARE_PROVIDER_SITE_OTHER): Payer: Medicare Other

## 2020-04-14 DIAGNOSIS — J4550 Severe persistent asthma, uncomplicated: Secondary | ICD-10-CM | POA: Diagnosis not present

## 2020-04-14 MED ORDER — MEPOLIZUMAB 100 MG/ML ~~LOC~~ SOSY
100.0000 mg | PREFILLED_SYRINGE | Freq: Once | SUBCUTANEOUS | Status: AC
  Administered 2020-04-14: 100 mg via SUBCUTANEOUS

## 2020-04-14 NOTE — Progress Notes (Signed)
Patient presented to the office today for first-time Nucala injection.  Primary Pulmonologist: Marshell Garfinkel MD Medication name: Nucala Strength: 100mg  Site(s): left lower abdomen  Epi pen/Auvi-Q visible during appointment: Yes  Time of injection: 1435  Patient evaluated every 15-20 minutes per protocol x2 hours.  1st check: 1450 Evaluation:Patient sitting quietly. Denies problems.  No redness or swelling at injection site.  2nd check: 1505  Evaluation:Patient sitting quietly reading. Denies problems.  3rd check: 1520  Evaluation:Patient sitting quietly. Denies problems.  4th check: [place]  Reading. Denies any problems.  No redness or swelling at injection site.  5th check: 1550  Evaluation:Patient sitting quietly.  Denies problems.  6th check: 1605  Evaluation:Patient sitting quietly.  Patient denies problems.  7th check: 1620  Evaluation: Patient sitting quietly.  Patient denies problems.  8th check: 1635  Evaluation:Patient denies problems.  No redness or swelling at injection site.  Patient aware of when and how to use Epipen.  Patient demonstrated use of epipen.  Patient aware of injection sites, and when to administer next injection.

## 2020-04-26 NOTE — Telephone Encounter (Signed)
Marco Morris, please advise if this message can be closed. Thanks.

## 2020-05-10 NOTE — Telephone Encounter (Signed)
Patient received Epipen and Nucala injection was 04/14/20. Nothing further at this time.

## 2020-05-17 ENCOUNTER — Other Ambulatory Visit: Payer: Self-pay | Admitting: Nurse Practitioner

## 2020-05-17 DIAGNOSIS — I480 Paroxysmal atrial fibrillation: Secondary | ICD-10-CM

## 2020-05-17 NOTE — Telephone Encounter (Signed)
Prescription refill request for Xarelto received.  Indication: a fib Last office visit:  01/26/20 Weight: 201# Age: 65  Scr: 1.06 CrCl: 88 mL/min

## 2020-05-29 ENCOUNTER — Other Ambulatory Visit: Payer: Self-pay | Admitting: Pulmonary Disease

## 2020-07-02 ENCOUNTER — Ambulatory Visit (INDEPENDENT_AMBULATORY_CARE_PROVIDER_SITE_OTHER): Payer: Medicare Other | Admitting: Internal Medicine

## 2020-07-02 ENCOUNTER — Encounter: Payer: Self-pay | Admitting: Internal Medicine

## 2020-07-02 ENCOUNTER — Other Ambulatory Visit: Payer: Self-pay

## 2020-07-02 ENCOUNTER — Encounter: Payer: Self-pay | Admitting: Radiology

## 2020-07-02 ENCOUNTER — Ambulatory Visit (INDEPENDENT_AMBULATORY_CARE_PROVIDER_SITE_OTHER): Payer: Medicare Other

## 2020-07-02 ENCOUNTER — Encounter: Payer: Medicare Other | Admitting: *Deleted

## 2020-07-02 VITALS — BP 138/86 | HR 72 | Ht 72.0 in | Wt 196.4 lb

## 2020-07-02 DIAGNOSIS — D6869 Other thrombophilia: Secondary | ICD-10-CM

## 2020-07-02 DIAGNOSIS — I480 Paroxysmal atrial fibrillation: Secondary | ICD-10-CM

## 2020-07-02 DIAGNOSIS — Z006 Encounter for examination for normal comparison and control in clinical research program: Secondary | ICD-10-CM

## 2020-07-02 DIAGNOSIS — I495 Sick sinus syndrome: Secondary | ICD-10-CM

## 2020-07-02 DIAGNOSIS — I10 Essential (primary) hypertension: Secondary | ICD-10-CM | POA: Diagnosis not present

## 2020-07-02 NOTE — Progress Notes (Signed)
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  Since last being seen in our clinic, the patient reports doing very well.  He has begun having palpitations every few days, typically lasting about 20 minutes.  He is concerned that this could be afib. SOB due to chronic lung disease is stable.  Today, he denies symptoms of chest pain, lower extremity edema, dizziness, presyncope, or syncope.  The patient is otherwise without complaint today.   Past Medical History:  Diagnosis Date  . Blood dyscrasia    high risk for blood clot formation  . Chronic kidney disease   . History of renal calculi   . Hypercholesterolemia   . Hypertension   . Hypothyroidism   . Neuromuscular disorder (HCC)    numbness left foot  . Pacemaker battery depletion   . PAF (paroxysmal atrial fibrillation) (HCC)    a.  anticoagulated with Xarelto b. s/p Tikosyn loading 07/2013 c. s/p PVI  . Symptomatic bradycardia   . 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 s/p STJ leadless pacemaker   Past Surgical History:  Procedure Laterality Date  . ATRIAL FIBRILLATION ABLATION [name] 10/09/2013   Procedure: ATRIAL FIBRILLATION ABLATION;  Surgeon: Coralyn Mark, MD;  Location: [facility] CATH LAB;  Service: Cardiovascular;  Laterality: [name];  . CHOLECYSTECTOMY  ~ 2000  . LITHOTRIPSY  2004; ~ 05/2013  . PACEMAKER IMPLANT [name] 10/23/2019   Procedure: PACEMAKER IMPLANT;  Surgeon: [name], MD;  Location: [facility] CV LAB;  Service: Cardiovascular;  Laterality: [name];  . PERMANENT PACEMAKER INSERTION [name] 07/18/2013   STJ Leadless pacemaker implanted by Dr Rayann Heman for symptomatic pauses  . PPM GENERATOR REMOVAL [name] 10/23/2019   Procedure: PPM GENERATOR REMOVAL;  Surgeon: [name], MD;  Location: [facility] CV LAB;  Service: Cardiovascular;  Laterality: [name];  . TEE WITHOUT CARDIOVERSION [name] 10/08/2013    Procedure: TRANSESOPHAGEAL ECHOCARDIOGRAM (TEE);  Surgeon: Sanda Klein, MD;  Location: [facility] ENDOSCOPY;  Service: Cardiovascular;  Laterality: [name];    ROS- all systems are reviewed and negative except as per HPI above  Current Outpatient Medications  Medication Sig Dispense Refill  . albuterol (VENTOLIN HFA) 108 (90 Base) MCG/ACT inhaler Inhale 2 puffs into the lungs every 4 (four) hours as needed for wheezing or shortness of breath.    Marland Kitchen atorvastatin (LIPITOR) 20 MG tablet TAKE 1 TABLET EVERY EVENING 90 tablet 3  . budesonide-formoterol (SYMBICORT) 160-4.5 MCG/ACT inhaler USE 2 INHALATIONS TWICE A DAY 30.6 g 3  . EPINEPHrine 0.3 mg/0.3 mL IJ SOAJ injection Inject 0.3 mg into the muscle as needed for anaphylaxis. 1 each 1  . levocetirizine (XYZAL) 5 MG tablet Take 5 mg by mouth daily after breakfast.     . levothyroxine (SYNTHROID, LEVOTHROID) 125 MCG tablet Take 125 mcg by mouth daily before breakfast.    . lisinopril (PRINIVIL,ZESTRIL) 10 MG tablet TAKE 1 TABLET (10 MG TOTAL) BY MOUTH DAILY. 30 tablet 0  . Mepolizumab (NUCALA) 100 MG/ML SOAJ Inject 100 mg into the skin every 28 (twenty-eight) days. 1 mL 11  . Multiple Vitamins-Minerals (CENTRUM SILVER 50+MEN) TABS Take 1 tablet by mouth daily after breakfast.     . Multiple Vitamins-Minerals (PRESERVISION AREDS 2 PO) Take 1 tablet by mouth 2 (two) times a day.    Marland Kitchen omeprazole (PRILOSEC) 20 MG capsule TAKE 1 CAPSULE BY MOUTH EVERY DAY 30 capsule  8  . potassium chloride SA (KLOR-CON) 20 MEQ tablet Take 20 mEq by mouth daily after breakfast.     . Tiotropium Bromide Monohydrate (SPIRIVA RESPIMAT) 2.5 MCG/ACT AERS Inhale 2 puffs into the lungs daily. 2 puff once per day    . XARELTO 20 MG TABS tablet TAKE 1 TABLET DAILY WITH SUPPER 90 tablet 3   No current facility-administered medications for this visit.    Physical Exam: Vitals:   07/02/20 0929  BP: 138/86  Pulse: 72  SpO2: 95%  Weight: 196 lb 6.4 oz (89.1 kg)  Height: 6' (1.829 m)     GEN- The patient is well appearing, alert and oriented x 3 today.   Head- normocephalic, atraumatic Eyes-  Sclera clear, conjunctiva pink Ears- hearing intact Oropharynx- clear Lungs- Clear to ausculation bilaterally, normal work of breathing Heart- Regular rate and rhythm, no murmurs, rubs or gallops, PMI not laterally displaced GI- soft, NT, ND, + BS Extremities- no clubbing, cyanosis, or edema  Pacemaker interrogation- reviewed in detail today,  See PACEART report  ekg tracing ordered today is personally reviewed and shows sinus  Assessment and Plan:  1. Symptomatic sinus bradycardia  Normal pacemaker function See Pace Art report No changes today he is not device dependant today  2. Paroxysmal atrial fibrillation Well controlled post ablation [id number] score is 2.  He is on xarelto Place 14 day zio given palpitations which are new  3. HTN Stable No change required today  4. COPD Chronic SOB is stable  Risks, benefits and potential toxicities for medications prescribed and/or refilled reviewed with patient today.   Return to see EP PA every 6 months  [name] MD, [facility] 07/02/2020 9:34 AM

## 2020-07-02 NOTE — Patient Instructions (Signed)
Medication Instructions:  Your physician recommends that you continue on your current medications as directed. Please refer to the Current Medication list given to you today.  Labwork: None ordered.  Testing/Procedures: Your physician has recommended that you wear a heart monitor. Heart monitors are medical devices that record the heart's electrical activity. Doctors most often use these monitors to diagnose arrhythmias. Arrhythmias are problems with the speed or rhythm of the heartbeat. The monitor is a small, portable device. You can wear one while you do your normal daily activities. This is usually used to diagnose what is causing palpitations/syncope (passing out).  Follow-Up: Your physician wants you to follow-up in: 6 months with    Tommye Standard, PA-C   You will receive a reminder letter in the mail two months in advance. If you don't receive a letter, please call our office to schedule the follow-up appointment.  Any Other Special Instructions Will Be Listed Below (If Applicable).  If you need a refill on your cardiac medications before your next appointment, please call your pharmacy.   ZIO XT- Long Term Monitor Instructions   Your physician has requested you wear your ZIO patch monitor__14__days.   This is a single patch monitor.  [employer] supplies one patch monitor per enrollment.  Additional stickers are not available.   Please do not apply patch if you will be having a Nuclear Stress Test, Echocardiogram, Cardiac CT, MRI, or Chest Xray during the time frame you would be wearing the monitor. The patch cannot be worn during these tests.  You cannot remove and re-apply the ZIO XT patch monitor.   Your ZIO patch monitor will be sent USPS Priority mail from [facility] directly to your home address. The monitor may also be mailed to a PO BOX if home delivery is not available.   It may take 3-5 days to receive your monitor after you have been enrolled.   Once you have  received you monitor, please review enclosed instructions.  Your monitor has already been registered assigning a specific monitor serial # to you.   Applying the monitor   Shave hair from upper left chest.   Hold abrader disc by orange tab.  Rub abrader in 40 strokes over left upper chest as indicated in your monitor instructions.   Clean area with 4 enclosed alcohol pads .  Use all pads to assure are is cleaned thoroughly.  Let dry.   Apply patch as indicated in monitor instructions.  Patch will be place under collarbone on left side of chest with arrow pointing upward.   Rub patch adhesive wings for 2 minutes.Remove white label marked "1".  Remove white label marked "2".  Rub patch adhesive wings for 2 additional minutes.   While looking in a mirror, press and release button in center of patch.  A small green light will flash 3-4 times .  This will be your only indicator the monitor has been turned on.     Do not shower for the first 24 hours.  You may shower after the first 24 hours.   Press button if you feel a symptom. You will hear a small click.  Record Date, Time and Symptom in the Patient Log Book.   When you are ready to remove patch, follow instructions on last 2 pages of Patient Log Book.  Stick patch monitor onto last page of Patient Log Book.   Place Patient Log Book in [place] box.  Use locking tab on box and tape box  closed securely.  The [employer] has [employer] on it.  Please place in mailbox as soon as possible.  Your physician should have your test results approximately 7 days after the monitor has been mailed back to [facility].   Call [facility] at [phone] if you have questions regarding your ZIO XT patch monitor.  Call them immediately if you see an orange light blinking on your monitor.   If your monitor falls off in less than 4 days contact our Monitor department at [phone].  If your monitor becomes loose or falls off  after 4 days call [employer] at [phone] for suggestions on securing your monitor.

## 2020-07-02 NOTE — Progress Notes (Signed)
Enrolled patient for a 14 day Zio XT Monitor to be mailed to patients home  

## 2020-07-02 NOTE — Research (Addendum)
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  The subject verbalized understanding of the trail requirements.  The subject agreed to participate in the [place] PM trial and signed the informed consent.  The informed consent was obtained prior to performance of any protocol-specific procedures for the subject.  A copy of the signed informed consent was given to the subject and a copy was placed in the subject's medical record.  Marco Morris is seen in conjunction with Dr. Jackalyn Lombard appointment today. He is doing well. He states that he has had a "couple of flurries" in his mid chest in the past few months. Dr. Rayann Heman has ordered a 14 day ZIO monitor to investigate this possible A. Fib; however, his EKG today was fine. He reports that he has had no AEs, SAEs, Hospitalizations or Urgent care visits since his last visit. His medications have been reconciled. I verified that he has my contact information in case he has any questions or concerns prior to his next visit in 6 months.   Date of assessment:    _02-18-2022                 Site ID / Name : [id number]  [facility] Patient ID / Site patient ID: __[id number]__  I.  PHYSICIAN NAME:  Last Name:                        First Name:                 Middle Intial:   II. VISIT TYPE:  []  2 wk  []  6 wk  []   3 mo  []  6 mo  []  12 mo  []  18 mo []  24 mo []  30 mo  []   36 mo                   []  42 mo  []  48 mo  []   54 mo  []   60 mo  []   66 mo  []   72 mo []  78 mo  [x]  84 mo                            []  90 mo  []  96 mo []  102 mo        []  Unscheduled visit; IF unscheduled, check one;    []  In Clinic   []  Remote        []  Other type visit; Specify: ____________________       1. Cardiac Drug Therapy:     []  None              Check all that apply, If a drug is a combination drug,  check both categories.                Drug Category                         Drug Names (Examples)       []  Antiarrhythmics (Class I)      Disopyramide / Norpace  Flecainide / Tambocor  Mexiletine  Procainamide / Pronestyl, Procan SR  Propafenone / Rythmol  Quinidine      []  Antiarrhythmics (Class III)   Amiodarone /  Pacerone  Dofetilide / Tikosyn  Dronedarone / Multaq  Ibutilide / Covert  Sotalol / Betapace     [x]  Anticoagulants  Ticagrelor / Brilinta  Apixaban / Eliquis  Dabigatran / Pradaxa  Rivaroxaban / Xarelto  Edoxaban / Savaysa  Fondaparinux / Arixtra  Enoxaparin / Lovenox  Coumadin / Warfarin  Heparin      []  Anti-platelets  Acetylsalicylic acid / Aspirin  Clopidogrel / Plavix   Prasugrel / Effient  Ticlopidine / Ticlid  Cilostazol / Pletal   Dipyridamole / Persantine  Aggrostat    [x]  ACE inhibitors  Benazepril / Lotensin  Captopril / Capoten  Enalapril / Vasotec  Lisinopril / Prinivil, Zestril  Quinapril / Accupril  Ramipril / Altace  Trandolapril / Mavik    []  ARBs (Angiotensin II recep.blocker)  Candesartan / Atacand  Eprosartan / Teveten  Irbesartan /  Avapro  Losartan / Cozaar  Olmesartan / Benicar  Telmisartan / Micardis  Valsartan / Diovan    []   Beta Blockers  Acebutolol / Sectral  Atenolol / Tenormin  Bisoprolol / Zebeta  Carvedilol / Coreg  Esmolol / Brevibloc  Labetalol / Normodyne  Metoprolol / Lopressor, Toprol  Nadolol / Corgard  Nebivolol / Bystolic  Propranolol / Inderal  Timolol / Blocadren                2. Has the subject experienced an Adverse Event since last assessment?                      []  Yes,   If yes, please complete an Adverse Event Form                     [x]  No                      Assessments:   AVEIR Leadless Pacemaker (LP) Assessment and Programming (All visits)   [x]  Yes   []  No   If No, please specify: _________________   And submit a deviation form   24- Hour Holter Monitor performed?                                                                    Not applicable for Phase II    Graded Exercise Test (CAEP) performed?   []  Yes []  No [x]  NA    EQ-5D Patient Survey performed?   []  Yes []  No [x]  NA       CRD-701 LEADLESS II STUDY (IDE)  Date of Assessment:    _02-18-2022                 Site ID / Name : [id number]  [facility] Site  ID _[id number]______ [facility] Subject ID: ___[id number]______  I.  Marco Morris LEADLESS PACEMAKER (LP) ASSESSMENT AND PROGRAMMING      VISIT TYPE:  []  Implant []  Pre-discharge                   []  2 wk  []  6 wk  []   3 mo  []  6 mo  []  12 mo  []  18 mo []  24 mo []  30 mo  []   36 mo                   []   42 mo  []  48 mo  []   54 mo  []   60 mo  []   66 mo  []   72 mo []  78 mo  [x]  84 mo                            []  90 mo  []  96 mo []  102 mo                   []  Unscheduled visit; IF unscheduled, check one;    []  In Clinic   []  Remote                  []  Other type visit; Specify: ____________________     Pacemaker Parameters               Initial Value    (before measurement)                   Final Value      (after measurements)                                                   2.  Mode       Check one:                        [x]  V V I   []  V O O        []  OFF   []  V V I R     []  V V I R Passive                []  Not recorded      [x]  V V I   []  V O O        []  OFF   []  V V I R     []  V V I R Passive                []  Not recorded          3. Magnet response       [x]  ON               []  Not recorded   []  OFF      [x]  ON            []  Not recorded   []  OFF        4. Sensor Gain                       []  Not recorded           [x]  [name]                               []  Not recorded           [x]  [name]       5. Basic Rate          40         bpm     []  Not recorded                      40  bpm            []  Not recorded     6. Max sensor rate                          bpm     []  Not recorded   [x]  [name]                          bpm      []  Not  recorded   [x]  [name]     7. Pulse amplitude            2.0                 V           []  Not recorded                             2.0                V         []  Not recorded     8. Pulse duration /width             0.4             ms     []  Not recorded                    0.4             ms     []  Not recorded     9.  R sensitivity            2.0               mV    []  Not recorded                 2.0               mV    []  Not recorded       10. Hysteresis Rate Delta                         bpm          []  Not recorded  []  [name]   [x]  OFF                           bpm           []  Not recorded  []  [name]     [x]  OFF       11. Search interval                             min     []  Not recorded     [x]  [name]                             min     []  Not recorded     [x]  [name]        12  Cycle Count                       cycles     []  Not  recorded     [x]  [name]                         cycles     []  Not recorded     [x]  [name]      13. Rate Responsive         V. Ref.       []  Low       []  Medium   []  High       [x]  OFF           []  Not recorded        []  Low       []  Medium     []  High       [x]  OFF           []  Not recorded         14. Base Refractory               250                ms                       []   Not recorded               250            ms         []   Not Recorded           15. Shortest V. Ref.                                ms                       []   Not recorded     XX  [name]                           ms                       []   Not recorded      XX  [name]       Diagnostics/ Episodes/ Test results                    Initial Value    (before measurement/ programming changes))              16.  RV LP Reset - Lifetime                                               0                     / []  Not recorded    17.  Pace (VP)- Lifetime                                                  0    %             /  []   Not recorded    18.  Battery Current                                   < 1.0  uA          /  []  Not recorded       AVEIR LEADLESS PACEMAKER (LP Assessment and Programming (Continued)       Test Values                     Value    19.  Capture threshold                                (pulse duration at 0.4 ms)                 0.5       V    []  Not recorded    []  Unable to obtain in a capture threshold *       20. R-Wave amplitude                14.5       mV    []  Not recorded    []  Unable to obtain R-wave amplitude- Check one:                                                                                                                                      []  PM Dependent     []   AVN/AVj Ablation    []   Complete Heart Block    []  Slow V. Rate    []    Other *      21. Battery Voltage            3.1           V    []  Not recorded    []  [name]    []  Unavailable      22. Impedence               580          ?          []  Not recorded     23. Remaining capacity to RRT                 96             %     []  Not recorded    []  [name]    []  Unavailable       24. Longevity                                    23.3     [  x] Years          []  Not recorded    []  [name]    []  Unavailable   25.  During the assessment(s),did the Nanostim LP have episodes of loss of capture and / or loss    of sensing that would likely cause symptomatic effects?       []  YES   [x]  NO       Current Outpatient Medications:    albuterol (VENTOLIN HFA) 108 (90 Base) MCG/ACT inhaler, Inhale 2 puffs into the lungs every 4 (four) hours as needed for wheezing or shortness of breath., Disp: , Rfl:    atorvastatin (LIPITOR) 20 MG tablet, TAKE 1 TABLET EVERY EVENING, Disp: 90 tablet, Rfl: 3   budesonide-formoterol (SYMBICORT) 160-4.5 MCG/ACT inhaler, USE 2 INHALATIONS TWICE A DAY, Disp: 30.6 g, Rfl: 3   EPINEPHrine 0.3 mg/0.3 mL IJ SOAJ injection, Inject 0.3 mg into the muscle as needed for anaphylaxis., Disp: 1 each, Rfl: 1   levocetirizine (XYZAL) 5 MG tablet, Take 5 mg by  mouth daily after breakfast. , Disp: , Rfl:    levothyroxine (SYNTHROID, LEVOTHROID) 125 MCG tablet, Take 125 mcg by mouth daily before breakfast., Disp: , Rfl:    lisinopril (PRINIVIL,ZESTRIL) 10 MG tablet, TAKE 1 TABLET (10 MG TOTAL) BY MOUTH DAILY., Disp: 30 tablet, Rfl: 0   Mepolizumab (NUCALA) 100 MG/ML SOAJ, Inject 100 mg into the skin every 28 (twenty-eight) days., Disp: 1 mL, Rfl: 11   Multiple Vitamins-Minerals (CENTRUM SILVER 50+MEN) TABS, Take 1 tablet by mouth daily after breakfast. , Disp: , Rfl:    Multiple Vitamins-Minerals (PRESERVISION AREDS 2 PO), Take 1 tablet by mouth 2 (two) times a day., Disp: , Rfl:    omeprazole (PRILOSEC) 20 MG capsule, TAKE 1 CAPSULE BY MOUTH EVERY DAY, Disp: 30 capsule, Rfl: 8   potassium chloride SA (KLOR-CON) 20 MEQ tablet, Take 20 mEq by mouth daily after breakfast. , Disp: , Rfl:    Tiotropium Bromide Monohydrate (SPIRIVA RESPIMAT) 2.5 MCG/ACT AERS, Inhale 2 puffs into the lungs daily. 2 puff once per day, Disp: , Rfl:    XARELTO 20 MG TABS tablet, TAKE 1 TABLET DAILY WITH SUPPER, Disp: 90 tablet, Rfl: 3

## 2020-07-06 ENCOUNTER — Other Ambulatory Visit: Payer: Self-pay | Admitting: Internal Medicine

## 2020-07-26 DIAGNOSIS — I10 Essential (primary) hypertension: Secondary | ICD-10-CM | POA: Diagnosis not present

## 2020-07-26 DIAGNOSIS — D6869 Other thrombophilia: Secondary | ICD-10-CM

## 2020-07-26 DIAGNOSIS — I480 Paroxysmal atrial fibrillation: Secondary | ICD-10-CM | POA: Diagnosis not present

## 2020-07-26 DIAGNOSIS — I495 Sick sinus syndrome: Secondary | ICD-10-CM

## 2020-08-02 ENCOUNTER — Other Ambulatory Visit: Payer: Self-pay

## 2020-08-02 ENCOUNTER — Ambulatory Visit (INDEPENDENT_AMBULATORY_CARE_PROVIDER_SITE_OTHER): Payer: Medicare Other | Admitting: Pulmonary Disease

## 2020-08-02 ENCOUNTER — Encounter: Payer: Self-pay | Admitting: Pulmonary Disease

## 2020-08-02 VITALS — BP 140/72 | HR 80 | Temp 98.0°F | Ht 73.0 in | Wt 194.0 lb

## 2020-08-02 DIAGNOSIS — J449 Chronic obstructive pulmonary disease, unspecified: Secondary | ICD-10-CM

## 2020-08-02 DIAGNOSIS — J4550 Severe persistent asthma, uncomplicated: Secondary | ICD-10-CM

## 2020-08-02 NOTE — Progress Notes (Signed)
Marco Morris    [id number]    June 30, 1955  Primary Care Physician:Moreira, Marco Manner, MD  Referring Physician: Jilda Panda, MD [address] [facility] [place],  [name] [address]  Chief complaint: Follow-up for COPD, asthma Started nucala December 6045  HPI: 65 year old with history of atrial fibrillation, hyperlipidemia, hypothyroidism, hypertension. He has complaints of dyspnea with activity and rest for the past several years which is getting worse. He has non-productive cough with no sputum production. Denies any wheeze, fevers, chills. He has history of atrial fibrillation status post ablation in 2015 and has been stable since then. He has history of GERD. Symptoms are well controlled on Zantac. There are no seasonal allergies, postnasal drip. He is physically active and does regular exercise with strength training, running and swimming 3 times a week In spite of the symptoms he continues to have an active lifestyle running a couple of miles every other day, swimming and exercising. Recently had a pacemaker placed  Pets:None Occupation: Truck driver Exposures: None. No mold at home Smoking history:15-pack-year smoking history. Quit in 1979 Travel History: No recent travel except for work  Interim history: Continues on inhalers.  Started on Nucala in December 2021 He notes some improvement in breathing with the new biologic Continues on Symbicort, Spiriva and Xyzal  Outpatient Encounter Medications as of 08/02/2020  Medication Sig  . albuterol (VENTOLIN HFA) 108 (90 Base) MCG/ACT inhaler Inhale 2 puffs into the lungs every 4 (four) hours as needed for wheezing or shortness of breath.  Marland Kitchen atorvastatin (LIPITOR) 20 MG tablet TAKE 1 TABLET EVERY EVENING  . budesonide-formoterol (SYMBICORT) 160-4.5 MCG/ACT inhaler USE 2 INHALATIONS TWICE A DAY  . EPINEPHrine 0.3 mg/0.3 mL IJ SOAJ injection Inject 0.3 mg into the muscle as needed for anaphylaxis.  Marland Kitchen levocetirizine (XYZAL) 5 MG tablet  Take 5 mg by mouth daily after breakfast.   . levothyroxine (SYNTHROID, LEVOTHROID) 125 MCG tablet Take 125 mcg by mouth daily before breakfast.  . lisinopril (PRINIVIL,ZESTRIL) 10 MG tablet TAKE 1 TABLET (10 MG TOTAL) BY MOUTH DAILY.  . Mepolizumab (NUCALA) 100 MG/ML SOAJ Inject 100 mg into the skin every 28 (twenty-eight) days.  . Multiple Vitamins-Minerals (CENTRUM SILVER 50+MEN) TABS Take 1 tablet by mouth daily after breakfast.   . Multiple Vitamins-Minerals (PRESERVISION AREDS 2 PO) Take 1 tablet by mouth 2 (two) times a day.  Marland Kitchen omeprazole (PRILOSEC) 20 MG capsule TAKE 1 CAPSULE BY MOUTH EVERY DAY  . potassium chloride SA (KLOR-CON) 20 MEQ tablet Take 20 mEq by mouth daily after breakfast.   . Tiotropium Bromide Monohydrate (SPIRIVA RESPIMAT) 2.5 MCG/ACT AERS Inhale 2 puffs into the lungs daily. 2 puff once per day  . XARELTO 20 MG TABS tablet TAKE 1 TABLET DAILY WITH SUPPER   No facility-administered encounter medications on file as of 08/02/2020.   Physical Exam: Blood pressure 140/72, pulse 80, temperature 98 F (36.7 C), temperature source Temporal, height 6\' 1"  (1.854 m), weight 194 lb (88 kg), SpO2 99 %. Gen:      No acute distress HEENT:  EOMI, sclera anicteric Neck:     No masses; no thyromegaly Lungs:    Clear to auscultation bilaterally; normal respiratory effort CV:         Regular rate and rhythm; no murmurs Abd:      + bowel sounds; soft, non-tender; no palpable masses, no distension Ext:    No edema; adequate peripheral perfusion Skin:      Warm and dry;  no rash Neuro: alert and oriented x 3 Psych: normal mood and affect  Data Reviewed: Imaging: CT chest 04/26/01-no lung mass, infiltrate, adenopathy. Early emphysematous changes. CT abdomen pelvis 03/03/13-lung images are clear. Chest x-ray 10/22/2019-no active cardiopulmonary disease. I reviewed all images personally  PFTs  02/05/17  FVC 4.46 (89%], FEV1 3.54 (94%), F/F 79, TLC 104%, DLCO 80% Minimal obstruction  and diffusion defect with significant bronchodilator response. Mild air trapping.  01/13/2020 FVC 4.51 [91%], FEV1 2.66 [9 9%], F/F 81, TLC 7.10 [95%], DLCO 27.85 [98%] No obstruction, bronchodilator response.  FENO 02/28/17- 23  Act score  03/10/2020-16 04/13/2020-16 08/03/2018 2-17  Labs: CBC 10/22/2019-WBC 6.4, eos 8%, absolute eosinophil count 512 Alpha-1 antitrypsin 02/28/2017-136, PI MM  Assessment:  COPD GOLD B (CAT score 18, no exacerbations) Asthma Has COPD, asthma overlap syndrome but I suspect COPD is not significant as he has mild emphysematous changes and minimal smoking history PFTs with improvement in obstruction but he still has bronchodilator response   He continues on maximal inhaled therapy with Symbicort, Spiriva with persistent symptoms Continues on Xyzal  Started on Nucala.  He does have some improvement in symptoms but not a dramatic change.  We will give it some more time and reassess in 6 months.  Can consider switching to Dupixent  Plan/Recommendations: Continue Symbicort, Spiriva, Xyzal Continue nucala  Follow-up in 6 months  Marco Garfinkel MD [facility] 08/02/2020, 11:17 AM  CC: Marco Panda, MD

## 2020-08-02 NOTE — Patient Instructions (Signed)
I am glad you are staying healthy with exercise and symptoms improved with the new medication Plan continue current therapy without change Follow-up in 6 months.

## 2020-08-06 ENCOUNTER — Other Ambulatory Visit: Payer: Self-pay | Admitting: *Deleted

## 2020-08-06 MED ORDER — ALBUTEROL SULFATE HFA 108 (90 BASE) MCG/ACT IN AERS: 2.0000 | INHALATION_SPRAY | RESPIRATORY_TRACT | 2 refills | Status: DC | PRN

## 2020-08-18 ENCOUNTER — Telehealth: Payer: Self-pay | Admitting: Pulmonary Disease

## 2020-08-18 NOTE — Telephone Encounter (Signed)
Attempted to call pt's wife Opal Sidles but line went straight to VM. Left message for her to return call.

## 2020-08-18 NOTE — Telephone Encounter (Signed)
Spoke with the pt's spouse, Opal Sidles, [place] per DPR  She states pt is having nasal congestion, cough- non prod, watery eyes, runny nose x 3 days  He is not having increased SOB or wheezing  No f/c/s, aches  He has been vaccinated against covid including booster  Has not taken a covid test and he does not think he needs to b/c he never leaves the house  She states he is still taking is xyzal daily, nucala as directed, and symbicort  She bought him some coricidin otc, but wants to make sure Dr Vaughan Browner is okay with him taking this and also see if he has any further recommendations  Please advise, thanks!  Allergies  Allergen Reactions  . Codeine Nausea And Vomiting

## 2020-08-18 NOTE — Telephone Encounter (Signed)
Agree with the treatment he is taking Can try Flonase or over-the-counter steroid nasal spray to help with nasal congestion. If he develops dyspnea or wheezing we can call in prednisone

## 2020-08-18 NOTE — Telephone Encounter (Signed)
I attempted to call the pts wife back and this call went straight to VM.  X 2

## 2020-08-19 NOTE — Telephone Encounter (Signed)
Called and spoke with Marco Morris to let her know of recs from Dr. Vaughan Browner. Advised her to call back if he starts experiencing any dyspnea or wheezing. She expressed understanding. Nothing further needed at this time.

## 2020-09-22 ENCOUNTER — Telehealth: Payer: Self-pay | Admitting: Pulmonary Disease

## 2020-09-22 ENCOUNTER — Other Ambulatory Visit (HOSPITAL_COMMUNITY): Payer: Self-pay

## 2020-09-22 DIAGNOSIS — J4550 Severe persistent asthma, uncomplicated: Secondary | ICD-10-CM

## 2020-09-22 MED ORDER — NUCALA 100 MG/ML ~~LOC~~ SOAJ
100.0000 mg | SUBCUTANEOUS | 0 refills | Status: DC
  Filled 2020-09-22 – 2020-09-23 (×2): qty 1, 28d supply, fill #0

## 2020-09-22 NOTE — Telephone Encounter (Signed)
Rx sent to [facility] for Nucala for a 1 pen only with note indicating this

## 2020-09-22 NOTE — Telephone Encounter (Signed)
Called [facility] home delivery to inquire about Nucala, Geradine Girt is on back order. [employer] put override in place to allow another pharmacy to be able to fill. Override is in place for 1 month.  Phone# [phone]  Sent email to [facility] to see if they are able to get med.

## 2020-09-23 ENCOUNTER — Telehealth: Payer: Self-pay | Admitting: Pulmonary Disease

## 2020-09-23 ENCOUNTER — Other Ambulatory Visit (HOSPITAL_COMMUNITY): Payer: Self-pay

## 2020-09-23 NOTE — Telephone Encounter (Signed)
Left message for patient to get payment for his $38.00 copay and to see when his next injection is due to confirm shipping/pickup from [facility].

## 2020-09-23 NOTE — Telephone Encounter (Signed)
Spoke to patient and scheduled Nucala shipment form [facility] to deliver 5/17. Next injection is due 10/09/20

## 2020-09-23 NOTE — Telephone Encounter (Signed)
Spoke to patient, will update in previous encounter

## 2020-09-24 ENCOUNTER — Other Ambulatory Visit (HOSPITAL_COMMUNITY): Payer: Self-pay

## 2020-09-28 ENCOUNTER — Other Ambulatory Visit: Payer: Self-pay | Admitting: Internal Medicine

## 2020-11-25 ENCOUNTER — Encounter: Payer: Self-pay | Admitting: *Deleted

## 2020-11-25 DIAGNOSIS — Z006 Encounter for examination for normal comparison and control in clinical research program: Secondary | ICD-10-CM

## 2020-11-25 NOTE — Research (Signed)
I called Mr. Garron and discussed the closing of the [employer] Leadless PM study. I told him that he would still be followed up with Dr. Rayann Heman at the [facility] office at regular intervals, but he will not be followed by Research anymore.  His future appointments will be arranged by Dr. Jackalyn Lombard appointment staff according to his guidelines. All questions were asked and answered. Patient voiced understanding and agreement of this plan and  I thanked him for his participation in this study.

## 2020-12-05 ENCOUNTER — Encounter: Payer: Self-pay | Admitting: Gastroenterology

## 2020-12-26 ENCOUNTER — Emergency Department (HOSPITAL_COMMUNITY): Payer: Medicare Other

## 2020-12-26 ENCOUNTER — Emergency Department (HOSPITAL_COMMUNITY)
Admission: EM | Admit: 2020-12-26 | Discharge: 2020-12-27 | Disposition: A | Discharge status: Home / Self Care | Payer: Medicare Other | Attending: Emergency Medicine | Admitting: Emergency Medicine

## 2020-12-26 DIAGNOSIS — Z950 Presence of cardiac pacemaker: Secondary | ICD-10-CM | POA: Insufficient documentation

## 2020-12-26 DIAGNOSIS — R42 Dizziness and giddiness: Secondary | ICD-10-CM | POA: Diagnosis present

## 2020-12-26 DIAGNOSIS — Z5321 Procedure and treatment not carried out due to patient leaving prior to being seen by health care provider: Secondary | ICD-10-CM | POA: Insufficient documentation

## 2020-12-26 LAB — BASIC METABOLIC PANEL
Anion gap: 6 (ref 5–15)
BUN: 14 mg/dL (ref 8–23)
CO2: 29 mmol/L (ref 22–32)
Calcium: 9.9 mg/dL (ref 8.9–10.3)
Chloride: 104 mmol/L (ref 98–111)
Creatinine, Ser: 1.11 mg/dL (ref 0.61–1.24)
GFR, Estimated: >60 mL/min (ref >60)
Glucose, Bld: 98 mg/dL (ref 70–99)
Potassium: 4.4 mmol/L (ref 3.5–5.1)
Sodium: 139 mmol/L (ref 135–145)

## 2020-12-26 LAB — CBC
HCT: 44.1 % (ref 39.0–52.0)
Hemoglobin: 14.6 g/dL (ref 13.0–17.0)
MCH: 29.7 pg (ref 26.0–34.0)
MCHC: 33.1 g/dL (ref 30.0–36.0)
MCV: 89.8 fL (ref 80.0–100.0)
Platelets: 157 10*3/uL (ref 150–400)
RBC: 4.91 MIL/uL (ref 4.22–5.81)
RDW: 14.1 % (ref 11.5–15.5)
WBC: 5.6 10*3/uL (ref 4.0–10.5)
nRBC: 0 % (ref 0.0–0.2)

## 2020-12-26 LAB — TROPONIN I (HIGH SENSITIVITY): Troponin I (High Sensitivity): 9 ng/L (ref <18)

## 2020-12-26 NOTE — ED Triage Notes (Signed)
Pt from home via [facility] for eval of dizzy/ spinning episodes x2 (last night, today). EKG unremarkable, -orthostatics. Pacemaker placed approx 60yr ago, Abbott brand, leadless. 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 Pt endorses "zinging"/stabbing episodes for approx 220mo message left for cardiologist today. No EKG changes during episodes, not currently being paced  VSS, ambulatory w EMS, in triage

## 2020-12-27 ENCOUNTER — Telehealth: Payer: Self-pay | Admitting: Internal Medicine

## 2020-12-27 DIAGNOSIS — R42 Dizziness and giddiness: Secondary | ICD-10-CM | POA: Diagnosis not present

## 2020-12-27 LAB — TROPONIN I (HIGH SENSITIVITY): Troponin I (High Sensitivity): 9 ng/L (ref <18)

## 2020-12-27 NOTE — Telephone Encounter (Signed)
MD Allred reviewed labs, chest xray, and EKG. All results okay.  Marco Morris (DPR) notified. Verbalized understanding.

## 2020-12-27 NOTE — Telephone Encounter (Signed)
Returning phone call.   Opal Sidles (patients wife, on [place]) states patient went to the ED yesterday waited in the lobby and left d/t long wait times. Patient had blood work completed but did not receive results. Advised I am not able to release those results, patient was not seen by provider. Patients device is not remote capable. Has in-clinic check with A. Tillery, PA-C 01/10/21. Advised I will forward to Dr. Bonita Quin nurse to see if able to assist patient with results or plan.

## 2020-12-27 NOTE — ED Notes (Signed)
Patient called x1 for vitals recheck with no response 

## 2020-12-27 NOTE — ED Notes (Signed)
Patient called x3 with no response

## 2020-12-27 NOTE — ED Notes (Signed)
Patient called x 2 with no response. 

## 2020-12-27 NOTE — Telephone Encounter (Signed)
Patient's wife states the patient went to the ED yesterday, but did not stay to get his results for his lab work and x ray. She states he was having dizzy spells and was having a pain in his chest so they were not sure if it was his pacemaker. She would like to speak with a nurse about the patient's results.

## 2021-01-03 ENCOUNTER — Encounter: Payer: Medicare Other | Admitting: Physician Assistant

## 2021-01-09 NOTE — Progress Notes (Signed)
Electrophysiology Office Note Date: 01/10/2021  ID:  Marco Morris, DOB 09/09/1955, MRN [id number]  PCP: Jilda Panda, MD Primary Cardiologist: None Electrophysiologist: [name], MD   CC: Pacemaker follow-up  Marco Morris is a 65 y.o. male seen today for [name], MD for routine electrophysiology followup.  Since last being seen in our clinic the patient reports doing OK  Since approx 2 months ago he has been having infrequent sharp chest pains. Last 1-2 seconds at most, and can happen anywhere from 2 to 60 minutes apart.  No specific aggravating or relieving patterns. Not positional, exertional, or reproducible. Seen in ED 12/26/2020 and work up was unremarkable. He states it happened twice while connected to ECG via EMS and nothing was seen on 12 lead. he denies exertional chest pain, palpitations, dyspnea, PND, orthopnea, nausea, vomiting, dizziness, syncope, edema, weight gain, or early satiety.  Device History: St. Jude Leadless PPM implanted 2015, gen change 2021 for sinus brady  Past Medical History:  Diagnosis Date   Blood dyscrasia    high risk for blood clot formation   Chronic kidney disease    History of renal calculi    Hypercholesterolemia    Hypertension    Hypothyroidism    Neuromuscular disorder (HCC)    numbness left foot   Pacemaker battery depletion    PAF (paroxysmal atrial fibrillation) ([place])    a.  anticoagulated with Xarelto b. s/p Tikosyn loading 07/2013 c. s/p PVI   Symptomatic bradycardia    Tachycardia-bradycardia syndrome ([place])    a. post termination pauses b. s/p STJ leadless pacemaker   Past Surgical History:  Procedure Laterality Date   ATRIAL FIBRILLATION ABLATION [name] 10/09/2013   Procedure: ATRIAL FIBRILLATION ABLATION;  Surgeon: Coralyn Mark, MD;  Location: [facility] CATH LAB;  Service: Cardiovascular;  Laterality: [name];   CHOLECYSTECTOMY  ~ 2000   LITHOTRIPSY  2004; ~ 05/2013   PACEMAKER IMPLANT [name] 10/23/2019   Procedure:  PACEMAKER IMPLANT;  Surgeon: [name], MD;  Location: [facility] CV LAB;  Service: Cardiovascular;  Laterality: [name];   PERMANENT PACEMAKER INSERTION [name] 07/18/2013   STJ Leadless pacemaker implanted by Dr Rayann Heman for symptomatic pauses   PPM GENERATOR REMOVAL [name] 10/23/2019   Procedure: PPM GENERATOR REMOVAL;  Surgeon: [name], MD;  Location: [facility];  Service: Cardiovascular;  Laterality: [name];   TEE WITHOUT CARDIOVERSION [name] 10/08/2013   Procedure: TRANSESOPHAGEAL ECHOCARDIOGRAM (TEE);  Surgeon: Sanda Klein, MD;  Location: [facility] ENDOSCOPY;  Service: Cardiovascular;  Laterality: [name];    Current Outpatient Medications  Medication Sig Dispense Refill   albuterol (VENTOLIN HFA) 108 (90 Base) MCG/ACT inhaler Inhale 2 puffs into the lungs every 4 (four) hours as needed for wheezing or shortness of breath. 24 g 2   atorvastatin (LIPITOR) 20 MG tablet TAKE 1 TABLET EVERY EVENING 90 tablet 3   budesonide-formoterol (SYMBICORT) 160-4.5 MCG/ACT inhaler USE 2 INHALATIONS TWICE A DAY 30.6 g 3   EPINEPHrine 0.3 mg/0.3 mL IJ SOAJ injection Inject 0.3 mg into the muscle as needed for anaphylaxis. 1 each 1   levocetirizine (XYZAL) 5 MG tablet Take 5 mg by mouth daily after breakfast.      levothyroxine (SYNTHROID, LEVOTHROID) 125 MCG tablet Take 125 mcg by mouth daily before breakfast.     lisinopril (PRINIVIL,ZESTRIL) 10 MG tablet TAKE 1 TABLET (10 MG TOTAL) BY MOUTH DAILY. 30 tablet 0   Mepolizumab (NUCALA) 100 MG/ML SOAJ Inject 1 mL (100 mg total) into the skin every  28 (twenty-eight) days. Deliver to patient's home please. 1 mL 0   omeprazole (PRILOSEC) 20 MG capsule TAKE 1 CAPSULE BY MOUTH EVERY DAY 30 capsule 9   potassium chloride SA (KLOR-CON) 20 MEQ tablet Take 20 mEq by mouth daily after breakfast.      Tiotropium Bromide Monohydrate (SPIRIVA RESPIMAT) 2.5 MCG/ACT AERS Inhale 2 puffs into the lungs daily. 2 puff once per day     XARELTO 20 MG TABS tablet TAKE 1 TABLET DAILY WITH  SUPPER 90 tablet 3   Multiple Vitamins-Minerals (CENTRUM SILVER 50+MEN) TABS Take 1 tablet by mouth daily after breakfast.  (Patient not taking: Reported on 01/10/2021)     Multiple Vitamins-Minerals (PRESERVISION AREDS 2 PO) Take 1 tablet by mouth 2 (two) times a day. (Patient not taking: Reported on 01/10/2021)     No current facility-administered medications for this visit.    Allergies:   Codeine   Social History: Social History   Socioeconomic History   Marital status: Married    Spouse name: Not on file   Number of children: 2   Years of education: Not on file   Highest education level: Not on file  Occupational History   Occupation: DRIVER    Employer: [employer]  Tobacco Use   Smoking status: Former    Packs/day: 1.75    Years: 8.00    Pack years: 14.00    Types: Cigarettes    Start date: 7    Quit date: 05/15/1977    Years since quitting: 43.6   Smokeless tobacco: Former    Types: Chew    Quit date: 01/13/2013  Vaping Use   Vaping Use: Never used  Substance and Sexual Activity   Alcohol use: Yes    Alcohol/week: 1.0 standard drink    Types: 1 Cans of beer per week   Drug use: No   Sexual activity: Not Currently  Other Topics Concern   Not on file  Social History Narrative   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   Social Determinants of Health   Financial Resource Strain: Not on file  Food Insecurity: Not on file  Transportation Needs: Not on file  Physical Activity: Not on file  Stress: Not on file  Social Connections: Not on file  Intimate Partner Violence: Not on file    Family History: Family History  Problem Relation Age of Onset   Heart attack Mother    Stroke Mother    Diabetes Mother    Cancer - Ovarian Mother    Leukemia Mother    Heart attack Father    Stroke Father    Diabetes Father    Cancer - Other Father    Hypertension Brother      Review of Systems: All other systems reviewed and are otherwise negative except  as noted above.  Physical Exam: Vitals:   01/10/21 0854  BP: (!) 150/90  Pulse: 80  SpO2: 96%  Weight: 190 lb 3.2 oz (86.3 kg)  Height: '6\' 1"'$  (1.854 m)     GEN- The patient is well appearing, alert and oriented x 3 today.   HEENT: normocephalic, atraumatic; sclera clear, conjunctiva pink; hearing intact; oropharynx clear; neck supple  Lungs- Clear to ausculation bilaterally, normal work of breathing.  No wheezes, rales, rhonchi Heart- Regular rate and rhythm, no murmurs, rubs or gallops  GI- soft, non-tender, non-distended, bowel sounds present  Extremities- no clubbing or cyanosis. No edema MS- no significant deformity or atrophy Skin- warm and dry,  no rash or lesion; PPM pocket well healed Psych- euthymic mood, full affect Neuro- strength and sensation are intact  PPM Interrogation- reviewed in detail today,  See PACEART report  EKG:  EKG is not ordered today. Personal review of ekg ordered  12/26/20  shows NSR at 71 bpm   Recent Labs: 12/26/2020: BUN 14; Creatinine, Ser 1.11; Hemoglobin 14.6; Platelets 157; Potassium 4.4; Sodium 139   Wt Readings from Last 3 Encounters:  01/10/21 190 lb 3.2 oz (86.3 kg)  08/02/20 194 lb (88 kg)  07/02/20 196 lb 6.4 oz (89.1 kg)     Other studies Reviewed: Additional studies/ records that were reviewed today include: Previous EP office notes, Previous remote checks, Most recent labwork.   Assessment and Plan:  1. Symptomatic bradycardia s/p St. Jude Leadless PPM  Normal PPM function See Pace Art report No changes today  2. Paroxysmal atrial fibrillation Well controlled post ablation Continue Xarelto for CHA2DS2-VASc of at least 2. Previous palpitations showed rare and short NS-AT re: palpitations Pt also has h/o blood dyscrasias carrying a "risk for thromboembolism". He has not had stroke, DVT, or PE to his knowledge.   3. HTN Continue current regimen   4. COPD Chronic, SOB is stable  5. Atypical chest pains Sharp,  infrequent. No exertional component.  CXR 12/26/20 unremarkable. Labs unremarkable including negative HS-trop. Will update Echo for completeness Myoview 07/2018 with normal EF and no ischemia. Will update if echo abnormal. Reviewed with Industry and VP 0% and no auto-tests that would potentially cause symptoms. Reviewed with Dr. Rayann Heman  Current medicines are reviewed at length with the patient today.   The patient does not have concerns regarding his medicines.  The following changes were made today:  none  Labs/ tests ordered today include:  Orders Placed This Encounter  Procedures   ECHOCARDIOGRAM COMPLETE   Disposition:   Follow up with Dr. Rayann Heman in 6 Months   Signed, Annamaria Helling  01/10/2021 9:23 AM  [facility] [place] [address] [place] [address] [phone] (office) [phone] (fax)

## 2021-01-10 ENCOUNTER — Ambulatory Visit (INDEPENDENT_AMBULATORY_CARE_PROVIDER_SITE_OTHER): Payer: Medicare Other | Admitting: Student

## 2021-01-10 ENCOUNTER — Encounter: Payer: Self-pay | Admitting: Student

## 2021-01-10 ENCOUNTER — Other Ambulatory Visit: Payer: Self-pay

## 2021-01-10 VITALS — BP 150/90 | HR 80 | Ht 73.0 in | Wt 190.2 lb

## 2021-01-10 DIAGNOSIS — Z950 Presence of cardiac pacemaker: Secondary | ICD-10-CM

## 2021-01-10 DIAGNOSIS — I495 Sick sinus syndrome: Secondary | ICD-10-CM

## 2021-01-10 DIAGNOSIS — Z006 Encounter for examination for normal comparison and control in clinical research program: Secondary | ICD-10-CM

## 2021-01-10 DIAGNOSIS — R079 Chest pain, unspecified: Secondary | ICD-10-CM

## 2021-01-10 DIAGNOSIS — I480 Paroxysmal atrial fibrillation: Secondary | ICD-10-CM

## 2021-01-10 LAB — CUP PACEART INCLINIC DEVICE CHECK
Date Time Interrogation Session: 20220829092643
Implantable Pulse Generator Implant Date: 20210610
Pulse Gen Serial Number: 1304509

## 2021-01-10 NOTE — Patient Instructions (Signed)
Medication Instructions:  Your physician recommends that you continue on your current medications as directed. Please refer to the Current Medication list given to you today.  *If you need a refill on your cardiac medications before your next appointment, please call your pharmacy*   Lab Work: None  If you have labs (blood work) drawn today and your tests are completely normal, you will receive your results only by: [place] (if you have MyChart) OR A paper copy in the mail If you have any lab test that is abnormal or we need to change your treatment, we will call you to review the results.   Testing/Procedures: Your physician has requested that you have an echocardiogram. Echocardiography is a painless test that uses sound waves to create images of your heart. It provides your doctor with information about the size and shape of your heart and how well your heart's chambers and valves are working. This procedure takes approximately one hour. There are no restrictions for this procedure.   Follow-Up: At [facility], you and your health needs are our priority.  As part of our continuing mission to provide you with exceptional heart care, we have created designated Provider Care Teams.  These Care Teams include your primary Cardiologist (physician) and Advanced Practice Providers (APPs -  Physician Assistants and Nurse Practitioners) who all work together to provide you with the care you need, when you need it.   Your next appointment:   6 month(s)  The format for your next appointment:   In Person  Provider:   Legrand Como "Oda Kilts, PA-C

## 2021-01-20 ENCOUNTER — Ambulatory Visit (INDEPENDENT_AMBULATORY_CARE_PROVIDER_SITE_OTHER): Payer: Medicare Other | Admitting: Pulmonary Disease

## 2021-01-20 ENCOUNTER — Encounter: Payer: Self-pay | Admitting: Pulmonary Disease

## 2021-01-20 ENCOUNTER — Other Ambulatory Visit: Payer: Self-pay

## 2021-01-20 VITALS — BP 114/70 | HR 60 | Ht 73.0 in | Wt 189.8 lb

## 2021-01-20 DIAGNOSIS — J4550 Severe persistent asthma, uncomplicated: Secondary | ICD-10-CM | POA: Diagnosis not present

## 2021-01-20 DIAGNOSIS — J449 Chronic obstructive pulmonary disease, unspecified: Secondary | ICD-10-CM

## 2021-01-20 NOTE — Patient Instructions (Signed)
I am glad you are doing well with regard to your breathing Continue the inhalers and nucala He can reduce Symbicort to 1 puff twice daily Follow-up in 6 months.

## 2021-01-20 NOTE — Progress Notes (Signed)
Marco Morris    [id number]    10/06/1955  Primary Care Physician:Moreira, Carloyn Manner, MD  Referring Physician: Jilda Panda, MD [phone] [facility] [place],  [place] [address]  Chief complaint: Follow-up for COPD, asthma Started nucala December 923  HPI: 65 year old with history of atrial fibrillation, hyperlipidemia, hypothyroidism, hypertension. He has complaints of dyspnea with activity and rest for the past several years which is getting worse. He has non-productive cough with no sputum production. Denies any wheeze, fevers, chills. He has history of atrial fibrillation status post ablation in 2015 and has been stable since then. He has history of GERD. Symptoms are well controlled on Zantac. There are no seasonal allergies, postnasal drip. He is physically active and does regular exercise with strength training, running and swimming 3 times a week In spite of the symptoms he continues to have an active lifestyle running a couple of miles every other day, swimming and exercising. Recently had a pacemaker placed  Pets:None Occupation: Truck driver Exposures: None. No mold at home Smoking history:15-pack-year smoking history. Quit in 1979 Travel History: No recent travel except for work  Interim history: Continues on inhalers.  Started on Nucala in December 2021 He notes some improvement in breathing with the new biologic Continues on Symbicort, Spiriva and Xyzal  Breathing is stable.  He hardly needs to use his rescue inhaler Continues to stay active running 3 miles and swimming several times a week  Outpatient Encounter Medications as of 01/20/2021  Medication Sig   albuterol (VENTOLIN HFA) 108 (90 Base) MCG/ACT inhaler Inhale 2 puffs into the lungs every 4 (four) hours as needed for wheezing or shortness of breath.   atorvastatin (LIPITOR) 20 MG tablet TAKE 1 TABLET EVERY EVENING   budesonide-formoterol (SYMBICORT) 160-4.5 MCG/ACT inhaler USE 2 INHALATIONS TWICE A DAY    EPINEPHrine 0.3 mg/0.3 mL IJ SOAJ injection Inject 0.3 mg into the muscle as needed for anaphylaxis.   levocetirizine (XYZAL) 5 MG tablet Take 5 mg by mouth daily after breakfast.    levothyroxine (SYNTHROID, LEVOTHROID) 125 MCG tablet Take 125 mcg by mouth daily before breakfast.   lisinopril (PRINIVIL,ZESTRIL) 10 MG tablet TAKE 1 TABLET (10 MG TOTAL) BY MOUTH DAILY.   Mepolizumab (NUCALA) 100 MG/ML SOAJ Inject 1 mL (100 mg total) into the skin every 28 (twenty-eight) days. Deliver to patient's home please.   omeprazole (PRILOSEC) 20 MG capsule TAKE 1 CAPSULE BY MOUTH EVERY DAY   potassium chloride SA (KLOR-CON) 20 MEQ tablet Take 20 mEq by mouth daily after breakfast.    Tiotropium Bromide Monohydrate (SPIRIVA RESPIMAT) 2.5 MCG/ACT AERS Inhale 2 puffs into the lungs daily. 2 puff once per day   XARELTO 20 MG TABS tablet TAKE 1 TABLET DAILY WITH SUPPER   [DISCONTINUED] Multiple Vitamins-Minerals (CENTRUM SILVER 50+MEN) TABS Take 1 tablet by mouth daily after breakfast.  (Patient not taking: Reported on 01/10/2021)   [DISCONTINUED] Multiple Vitamins-Minerals (PRESERVISION AREDS 2 PO) Take 1 tablet by mouth 2 (two) times a day. (Patient not taking: Reported on 01/10/2021)   No facility-administered encounter medications on file as of 01/20/2021.   Physical Exam: Blood pressure 114/70, pulse 60, height '6\' 1"'$  (1.854 m), weight 189 lb 12.8 oz (86.1 kg), SpO2 97 %. Gen:      No acute distress HEENT:  EOMI, sclera anicteric Neck:     No masses; no thyromegaly Lungs:    Clear to auscultation bilaterally; normal respiratory effort CV:  Regular rate and rhythm; no murmurs Abd:      + bowel sounds; soft, non-tender; no palpable masses, no distension Ext:    No edema; adequate peripheral perfusion Skin:      Warm and dry; no rash Neuro: alert and oriented x 3 Psych: normal mood and affect   Data Reviewed: Imaging: CT chest 04/26/01-no lung mass, infiltrate, adenopathy. Early emphysematous  changes. CT abdomen pelvis 03/03/13-lung images are clear. Chest x-ray 10/22/2019-no active cardiopulmonary disease. Chest x-ray 12/26/2020-no acute cardiopulmonary abnormality I reviewed all images personally  PFTs  02/05/17  FVC 4.46 (89%], FEV1 3.54 (94%), F/F 79, TLC 104%, DLCO 80% Minimal obstruction and diffusion defect with significant bronchodilator response. Mild air trapping.  01/13/2020 FVC 4.51 [91%], FEV1 2.66 [9 9%], F/F 81, TLC 7.10 [95%], DLCO 27.85 [98%] No obstruction, bronchodilator response.  FENO 02/28/17- 23  Act score  03/10/2020-16 04/13/2020-16 08/02/2020-17  Labs: CBC 10/22/2019-WBC 6.4, eos 8%, absolute eosinophil count 512 Alpha-1 antitrypsin 02/28/2017-136, PI MM  Assessment:  COPD GOLD B (CAT score 18, no exacerbations) Asthma Has COPD, asthma overlap syndrome but I suspect COPD is not significant as he has mild emphysematous changes and minimal smoking history PFTs with improvement in obstruction but he still has bronchodilator response   He continues on maximal inhaled therapy with Symbicort, Spiriva Continues on Xyzal Started on Nucala last year with some improvement in symptoms  Discussed de-escalation of inhaled steroid therapy but he is reluctant as regimen appears to be working for him. I have asked him to trial 1 puff of Symbicort 160 twice daily instead of 2 puffs for few weeks and see how he does  Plan/Recommendations: Continue Symbicort, Spiriva, Xyzal Continue nucala  Follow-up in 6 months  Marshell Garfinkel MD [facility] 01/20/2021, 8:56 AM  CC: Jilda Panda, MD

## 2021-01-24 ENCOUNTER — Other Ambulatory Visit: Payer: Self-pay

## 2021-01-24 ENCOUNTER — Ambulatory Visit (HOSPITAL_COMMUNITY): Payer: Medicare Other | Attending: Cardiology

## 2021-01-24 DIAGNOSIS — R079 Chest pain, unspecified: Secondary | ICD-10-CM

## 2021-01-24 LAB — ECHOCARDIOGRAM COMPLETE
Area-P 1/2: 3.03 cm2
S' Lateral: 2.7 cm

## 2021-03-03 ENCOUNTER — Other Ambulatory Visit (HOSPITAL_COMMUNITY): Payer: Self-pay

## 2021-03-04 ENCOUNTER — Telehealth: Payer: Self-pay | Admitting: Pharmacy Technician

## 2021-03-04 NOTE — Telephone Encounter (Signed)
Patient Advocate Encounter  Received notification from [place] that prior authorization for NUCALA is required.   PA submitted on 10.21.22 [place] [id number] Status is pending   [facility] will continue to follow  Luciano Cutter, CPhT Patient Advocate Phone: [phone] Fax:  [phone]

## 2021-03-07 ENCOUNTER — Other Ambulatory Visit (HOSPITAL_COMMUNITY): Payer: Self-pay

## 2021-03-07 NOTE — Telephone Encounter (Signed)
Received notification from [place] regarding a prior authorization for [place]. Authorization has been APPROVED from 09.21.22 to 10.22.23.   Authorization # [id number]

## 2021-03-28 ENCOUNTER — Other Ambulatory Visit: Payer: Self-pay | Admitting: Pulmonary Disease

## 2021-03-28 DIAGNOSIS — J4550 Severe persistent asthma, uncomplicated: Secondary | ICD-10-CM

## 2021-04-04 ENCOUNTER — Telehealth: Payer: Self-pay | Admitting: Pulmonary Disease

## 2021-04-04 NOTE — Telephone Encounter (Signed)
Pt stated when Fed Ex package containing Nucala arrived, ice packs were melted and temp was around 75 degrees. Pt would like to know if medication is still safe to use. Pharmacy could you please advise?

## 2021-04-05 NOTE — Telephone Encounter (Signed)
Called patient and advised that he should take Nucala on Saturday, 04/09/21. Medication was shipped on Friday, 04/01/21 from [facility] but stayed in [facility] warehouse through the weekend.  Nucala can be left at room temperature for 7 days - so advised that to be safe he should go ahead and take the medication by Saturday, 04/09/21.  He is technically due on Monday, 04/11/21. Patient verbalized understanding. Nothing further needed  Knox Saliva, PharmD, MPH, BCPS Clinical Pharmacist (Rheumatology and Pulmonology)

## 2021-05-05 ENCOUNTER — Other Ambulatory Visit: Payer: Self-pay | Admitting: Pulmonary Disease

## 2021-05-11 ENCOUNTER — Other Ambulatory Visit: Payer: Self-pay | Admitting: Internal Medicine

## 2021-05-11 DIAGNOSIS — I480 Paroxysmal atrial fibrillation: Secondary | ICD-10-CM

## 2021-05-11 NOTE — Telephone Encounter (Signed)
Xarelto 20mg  refill request received. Pt is 65 years old, weight-86.1kg, Crea- 1.11 on 12/26/2020, last seen by Oda Kilts on 01/10/2021, Diagnosis-Afib, CrCl-78.42ml/min; Dose is appropriate based on dosing criteria. Will send in refill to requested pharmacy.

## 2021-05-13 ENCOUNTER — Encounter: Payer: Self-pay | Admitting: Nurse Practitioner

## 2021-05-20 ENCOUNTER — Telehealth: Payer: Self-pay | Admitting: Gastroenterology

## 2021-05-20 NOTE — Telephone Encounter (Signed)
Patient's wife called after hours tonight. He is scheduled to see [place] in the office on 1/18. Patient of Dr. Ardis Hughs but has not been seen in almost 10 years.  He has had persistent diarrhea for a few months, coming and goes, with abdominal cramps. Got worse in recent days. States he had a workup by PCP, no records I can see, with negative stool studies for infection. He has not tried immodium or anything OTC to treat this. If negative for infection he can try a dose of immodium and see how he responds. Denies fever or intolerance of PO, no vomiting. Is having some abdominal cramping tonight.   Counseled them over the weekend if worsening pain, intolerant of PO, fevers, worsening diarrhea, bleeding, etc, his options are urgent care or the ED. Otherwise told him we will have our staff touch base Monday and see if any openings to be seen sooner, sounds like he needs a colonoscopy in the near future.   Chong Sicilian, can you please contact this patient on Monday and check on him, see if any openings sooner than what is listed, per patient request?   Corlis Hove, old patient of yours who wants to re-establish with you.

## 2021-05-23 NOTE — Telephone Encounter (Signed)
The pt returned call and states he is much better than he was on Friday. I offered him a sooner appt on 1/13 with Ellouise Newer but the pt declined stating he will keep app with Nevin Bloodgood on 1/18 and call back if his symptoms return.

## 2021-05-23 NOTE — Telephone Encounter (Signed)
Left message on machine to call back  

## 2021-06-01 ENCOUNTER — Encounter: Payer: Self-pay | Admitting: Nurse Practitioner

## 2021-06-01 ENCOUNTER — Other Ambulatory Visit: Payer: Medicare PPO

## 2021-06-01 ENCOUNTER — Ambulatory Visit (INDEPENDENT_AMBULATORY_CARE_PROVIDER_SITE_OTHER): Payer: Medicare PPO | Admitting: Nurse Practitioner

## 2021-06-01 VITALS — BP 118/64 | HR 73 | Ht 72.0 in | Wt 191.4 lb

## 2021-06-01 DIAGNOSIS — R194 Change in bowel habit: Secondary | ICD-10-CM | POA: Diagnosis not present

## 2021-06-01 NOTE — Patient Instructions (Signed)
Your provider has requested that you go to the basement level for lab work before leaving today. Press "B" on the elevator. The lab is located at the first door on the left as you exit the elevator.  If you are age 68 or older, your body mass index should be between 23-30. Your Body mass index is 25.96 kg/m. If this is out of the aforementioned range listed, please consider follow up with your Primary Care Provider.  If you are age 49 or younger, your body mass index should be between 19-25. Your Body mass index is 25.96 kg/m. If this is out of the aformentioned range listed, please consider follow up with your Primary Care Provider.   ________________________________________________________  The [facility] providers would like to encourage you to use [facility] to communicate with providers for non-urgent requests or questions.  Due to long hold times on the telephone, sending your provider a message by [facility] may be a faster and more efficient way to get a response.  Please allow 48 business hours for a response.  Please remember that this is for non-urgent requests.  _______________________________________________________

## 2021-06-01 NOTE — Progress Notes (Addendum)
ASSESSMENT AND PLAN    # 66 yo male with bowel changes over the last three months. Bowel movements more frequent, stools without form / loose at times. He has associated left mid abdominal cramping, increased flatus and at times is passing mucous from rectum in absence of a BM.  Infectious etiology seems unlikely given negative giardia, O&P, and GI pathogen panel negative . However we do need to rule out C. Difficile.  Other possibilities include inflammatory bowel disease. SIBO?  --Check for C-diff.  --If C-diff is negative will proceed with colonoscopy.  He is due for screening but this may become diagnostic if still having bowel changes at time of colonoscopy.  # History of AFib. On Xarelto. This will need to be held for two days prior to colonoscopy ( once it is scheduled)   Addendum 06/24/21 Labs faxed from PCP 06/02/21 TSH 5.15 LFTs normal CBC normal with hemoglobin of 14.7  HISTORY OF PRESENT ILLNESS     Chief Complaint : bowel changes  Marco Morris is a 66 y.o. male with a past medical history significant for permanent pacemaker,  AFib Danis post ablation in 2015, HDL, HTN, hypothyroidism, GERD, COPD with asthma overlap,  diverticulosis, kidney stones, cholecystectomy. See PMH below for any additional history.   Marco Morris is known to Dr. Ardis Hughs from a screening colonoscopy in 2012 .  He is due for another screening colonoscopy but comes in today for evaluation of bowel changes which started 3 months ago. Prior to 3 months ago he was having one solid BM a day. He hasn't made any medication or dietary changes. No recent antibiotics. He is having 2-3 BMs a day associated with excessive gas, intestinal growling and mucoid discharge. His stools vary in consistency from being unformed to liquid to sometimes like small pieces of gravel. No blood in stool. Some associated urgency but it is controllable. His weight has been stable.  Bowel changes associated with left mid abdominal  cramping which generally gets better with defecation. However, two weeks ago the crampy pain was more constant, not relieved with defecation and he actually had to miss work that day.   Patient saw his PCP late December.  He brings in a copy of stool studies done by [facility].  Giardia negative, O&P negative, GI pathogen panel negative.  C. difficile apparently not ordered.    CBC Latest Ref Rng & Units 12/26/2020 01/13/2020 10/20/2019  WBC 4.0 - 10.5 K/uL 5.6 6.5 6.4  Hemoglobin 13.0 - 17.0 g/dL 14.6 14.5 14.8  Hematocrit 39.0 - 52.0 % 44.1 43.1 43.0  Platelets 150 - 400 K/uL 157 138.0(L) 153    Lab Results  Component Value Date   LIPASE 28 11/26/2017   CMP Latest Ref Rng & Units 12/26/2020 10/24/2019 10/20/2019  Glucose 70 - 99 mg/dL 98 105(H) 117(H)  BUN 8 - 23 mg/dL 14 12 14   Creatinine 0.61 - 1.24 mg/dL 1.11 1.06 1.16  Sodium 135 - 145 mmol/L 139 137 138  Potassium 3.5 - 5.1 mmol/L 4.4 3.9 4.6  Chloride 98 - 111 mmol/L 104 107 100  CO2 22 - 32 mmol/L 29 24 28   Calcium 8.9 - 10.3 mg/dL 9.9 8.9 9.9  Total Protein 6.5 - 8.1 g/dL - - -  Total Bilirubin 0.3 - 1.2 mg/dL - - -  Alkaline Phos 38 - 126 U/L - - -  AST 15 - 41 U/L - - -  ALT 0 - 44 U/L - - -  Past Medical History:  Diagnosis Date   Blood dyscrasia    high risk for blood clot formation   Chronic kidney disease    History of renal calculi    Hypercholesterolemia    Hypertension    Hypothyroidism    Neuromuscular disorder (HCC)    numbness left foot   Pacemaker battery depletion    PAF (paroxysmal atrial fibrillation) ([place])    a.  anticoagulated with Xarelto b. s/p Tikosyn loading 07/2013 c. s/p PVI   Symptomatic bradycardia    Tachycardia-bradycardia syndrome ([place])    a. post termination pauses b. s/p STJ leadless pacemaker     Past Surgical History:  Procedure Laterality Date   ATRIAL FIBRILLATION ABLATION [name] 10/09/2013   Procedure: ATRIAL FIBRILLATION ABLATION;  Surgeon: Coralyn Mark, MD;  Location: [place]  CATH LAB;  Service: Cardiovascular;  Laterality: [name];   CHOLECYSTECTOMY  ~ 2000   LITHOTRIPSY  2004; ~ 05/2013   PACEMAKER IMPLANT [name] 10/23/2019   Procedure: PACEMAKER IMPLANT;  Surgeon: [name], MD;  Location: [place] CV LAB;  Service: Cardiovascular;  Laterality: [name];   PERMANENT PACEMAKER INSERTION [name] 07/18/2013   STJ Leadless pacemaker implanted by Dr Rayann Heman for symptomatic pauses   PPM GENERATOR REMOVAL [name] 10/23/2019   Procedure: PPM GENERATOR REMOVAL;  Surgeon: [name], MD;  Location: [place] CV LAB;  Service: Cardiovascular;  Laterality: [name];   TEE WITHOUT CARDIOVERSION [name] 10/08/2013   Procedure: TRANSESOPHAGEAL ECHOCARDIOGRAM (TEE);  Surgeon: Sanda Klein, MD;  Location: [facility] ENDOSCOPY;  Service: Cardiovascular;  Laterality: [name];   Family History  Problem Relation Age of Onset   Heart attack Mother    Stroke Mother    Diabetes Mother    Cancer - Ovarian Mother    Leukemia Mother    Heart attack Father    Stroke Father    Diabetes Father    Cancer - Other Father    Colon cancer Father    Hypertension Brother    Social History   Tobacco Use   Smoking status: Former    Packs/day: 1.75    Years: 8.00    Pack years: 14.00    Types: Cigarettes    Start date: 65    Quit date: 05/15/1977    Years since quitting: 44.0   Smokeless tobacco: Former    Types: Chew    Quit date: 01/13/2013  Vaping Use   Vaping Use: Never used  Substance Use Topics   Alcohol use: Yes    Alcohol/week: 4.0 standard drinks    Types: 4 Cans of beer per week   Drug use: No   Current Outpatient Medications  Medication Sig Dispense Refill   albuterol (VENTOLIN HFA) 108 (90 Base) MCG/ACT inhaler Inhale 2 puffs into the lungs every 4 (four) hours as needed for wheezing or shortness of breath. 24 g 2   atorvastatin (LIPITOR) 20 MG tablet TAKE 1 TABLET EVERY EVENING 90 tablet 3   budesonide-formoterol (SYMBICORT) 160-4.5 MCG/ACT inhaler USE 2 INHALATIONS TWICE A DAY 30.6 g 3    EPINEPHrine 0.3 mg/0.3 mL IJ SOAJ injection Inject 0.3 mg into the muscle as needed for anaphylaxis. 1 each 1   levocetirizine (XYZAL) 5 MG tablet Take 5 mg by mouth daily after breakfast.      levothyroxine (SYNTHROID, LEVOTHROID) 125 MCG tablet Take 125 mcg by mouth daily before breakfast.     lisinopril (PRINIVIL,ZESTRIL) 10 MG tablet TAKE 1 TABLET (10 MG TOTAL) BY MOUTH DAILY. 30 tablet 0   Mepolizumab (  NUCALA) 100 MG/ML SOAJ INJECT 100 MG UNDER THE SKIN EVERY 28 DAYS 1 mL 11   omeprazole (PRILOSEC) 20 MG capsule TAKE 1 CAPSULE BY MOUTH EVERY DAY 30 capsule 9   potassium chloride SA (KLOR-CON) 20 MEQ tablet Take 20 mEq by mouth daily after breakfast.      rivaroxaban (XARELTO) 20 MG TABS tablet TAKE 1 TABLET DAILY WITH SUPPER 90 tablet 2   Tiotropium Bromide Monohydrate (SPIRIVA RESPIMAT) 2.5 MCG/ACT AERS Inhale 2 puffs into the lungs daily. 2 puff once per day     No current facility-administered medications for this visit.   Allergies  Allergen Reactions   Codeine Nausea And Vomiting     Review of Systems: Positive for back pain, blood in urine, cough, fatigue, hearing problems, shortness of breath, sleeping problems, swelling of feet and legs.  All other systems reviewed and negative except where noted in HPI.    PHYSICAL EXAM :    Wt Readings from Last 3 Encounters:  06/01/21 191 lb 6.4 oz (86.8 kg)  01/20/21 189 lb 12.8 oz (86.1 kg)  01/10/21 190 lb 3.2 oz (86.3 kg)    BP 118/64    Pulse 73    Ht 6' (1.829 m)    Wt 191 lb 6.4 oz (86.8 kg)    BMI 25.96 kg/m  Constitutional:  Generally well appearing male in no acute distress. Psychiatric: Pleasant. Normal mood and affect. Behavior is normal. EENT: Pupils normal.  Conjunctivae are normal. No scleral icterus. Neck supple.  Cardiovascular: Normal rate, regular rhythm. No edema Pulmonary/chest: Effort normal and breath sounds normal. No wheezing, rales or rhonchi. Abdominal: Soft, nondistended, nontender. Bowel sounds active  throughout. There are no masses palpable. No hepatomegaly. Neurological: Alert and oriented to person place and time. Skin: Skin is warm and dry. No rashes noted.  Tye Savoy, NP  06/01/2021, 12:01 PM  Cc:  Jilda Panda, MD

## 2021-06-02 ENCOUNTER — Other Ambulatory Visit: Payer: Medicare PPO

## 2021-06-02 DIAGNOSIS — R194 Change in bowel habit: Secondary | ICD-10-CM

## 2021-06-02 NOTE — Progress Notes (Signed)
I agree with the above note, plan 

## 2021-06-03 LAB — CLOSTRIDIUM DIFFICILE TOXIN B, QUALITATIVE, REAL-TIME PCR: Toxigenic C. Difficile by PCR: NOT DETECTED

## 2021-06-07 ENCOUNTER — Telehealth: Payer: Self-pay

## 2021-06-07 NOTE — Telephone Encounter (Signed)
Patient with diagnosis of afib on Xarelto for anticoagulation.    Procedure: colonoscopy Date of procedure: 06/27/21  CHA2DS2-VASc Score = 2  This indicates a 2.2% annual risk of stroke. The patient's score is based upon: CHF History: 0 HTN History: 1 Diabetes History: 0 Stroke History: 0 Vascular Disease History: 0 Age Score: 1 Gender Score: 0   CrCl 31mL/min Platelet count 157K  Per office protocol, patient can hold Xarelto for 2 days prior to procedure.

## 2021-06-07 NOTE — Telephone Encounter (Signed)
[facility] Pre-operative Risk Assessment     Request for surgical clearance:     Endoscopy Procedure  What type of surgery is being performed?     Colonoscopy  When is this surgery scheduled?     06/27/21  What type of clearance is required ?   Pharmacy  Are there any medications that need to be held prior to surgery and how long? Xarelto - 2 days  Practice name and name of physician performing surgery?      [facility] - Dr. Edison Nasuti  What is your office phone and fax number?      Phone- [phone]  [phone]  Anesthesia type (None, local, MAC, general) ?       MAC

## 2021-06-08 NOTE — Telephone Encounter (Signed)
° °  Name: Marco Morris  DOB: [birth year]  MRN: [id number]   Primary Cardiologist: Dr. Rayann Heman  Chart reviewed as part of pre-operative protocol coverage. We have been asked for guidance to hold xarelto for endoscopy.  Per our clinical pharmacist: Patient with diagnosis of afib on Xarelto for anticoagulation.     Procedure: colonoscopy Date of procedure: 06/27/21   CHA2DS2-VASc Score = 2  This indicates a 2.2% annual risk of stroke. The patient's score is based upon: CHF History: 0 HTN History: 1 Diabetes History: 0 Stroke History: 0 Vascular Disease History: 0 Age Score: 1 Gender Score: 0   CrCl 46mL/min Platelet count 157K   Per office protocol, patient can hold Xarelto for 2 days prior to procedure   I will route this recommendation to the requesting party via [place] fax function and remove from pre-op pool. Please call with questions.  Tami Lin [name], PA 06/08/2021, 7:51 AM

## 2021-06-14 ENCOUNTER — Other Ambulatory Visit: Payer: Self-pay

## 2021-06-14 ENCOUNTER — Telehealth: Payer: Self-pay

## 2021-06-14 ENCOUNTER — Other Ambulatory Visit (HOSPITAL_COMMUNITY): Payer: Self-pay

## 2021-06-14 DIAGNOSIS — R194 Change in bowel habit: Secondary | ICD-10-CM

## 2021-06-14 DIAGNOSIS — Z1211 Encounter for screening for malignant neoplasm of colon: Secondary | ICD-10-CM

## 2021-06-14 MED ORDER — PEG 3350-KCL-NA BICARB-NACL 420 G PO SOLR
4000.0000 mL | Freq: Once | ORAL | 0 refills | Status: DC
  Filled 2021-06-14: qty 4000, 1d supply, fill #0

## 2021-06-14 MED ORDER — PEG 3350-KCL-NA BICARB-NACL 420 G PO SOLR: 4000.0000 mL | Freq: Once | ORAL | 0 refills | Status: AC

## 2021-06-14 NOTE — Telephone Encounter (Signed)
-----   Message from Marlon Pel, RN sent at 06/13/2021 10:21 AM EST ----- Sherlynn Stalls, please send the patient instructions for the procedure and cancel the pre-visit ----- Message ----- From: Steva Ready, RN Sent: 06/09/2021  12:11 PM EST To: Marlon Pel, RN  HEY- THIS PT HAD AN OV WITH PAULA 06-01-2021 AND HAS BEEN SCHEDULED FOR A PV 2-6- DOES HE ACTUALLY NEED THIS PV? Will Schier SCHEDULED THE PV   PLEASE ADVISE-  [place]

## 2021-06-14 NOTE — Telephone Encounter (Signed)
Order sent to patient's pharmacy for [facility] and instructions given verbally and by MyChart. Let patient know he did not need to do the Pre-Visit appt. And let him know we received approval from his cardiologist to HOLD his Xarelto 2 days prior to his Colonoscopy.

## 2021-06-24 ENCOUNTER — Telehealth: Payer: Self-pay | Admitting: Gastroenterology

## 2021-06-24 ENCOUNTER — Encounter: Payer: Self-pay | Admitting: Gastroenterology

## 2021-06-24 NOTE — Telephone Encounter (Signed)
On call note. Pts wife reports he has worsening abdominal pain over the past few days. She relates he saw Dr. Lovena Neighbours at [facility] yesterday for the increased abdominal pain and a CT was performed yesterday. She relates Dr. Lovena Neighbours called today reporting a liver laceration and that they should contact their GI physician, Dr. Ardis Hughs. He is maintained on Xarelto. Wife denies he has has any recent trauma. CT report is not available in Epic.  Advised that he needs immediate evaluation, hold Xarelto, NPO and go the closest ED now. He has a colonoscopy scheduled with Dr. Ardis Hughs on Monday and await ED evaluation to determine if colonoscopy will need to be canceled.

## 2021-06-27 ENCOUNTER — Ambulatory Visit: Payer: Medicare PPO | Admitting: Gastroenterology

## 2021-06-27 ENCOUNTER — Encounter: Payer: Self-pay | Admitting: Gastroenterology

## 2021-06-27 VITALS — BP 114/67 | HR 71 | Temp 97.8°F | Resp 18 | Ht 72.0 in | Wt 191.0 lb

## 2021-06-27 DIAGNOSIS — R194 Change in bowel habit: Secondary | ICD-10-CM

## 2021-06-27 DIAGNOSIS — D123 Benign neoplasm of transverse colon: Secondary | ICD-10-CM | POA: Diagnosis not present

## 2021-06-27 DIAGNOSIS — D120 Benign neoplasm of cecum: Secondary | ICD-10-CM | POA: Diagnosis not present

## 2021-06-27 MED ORDER — SODIUM CHLORIDE 0.9 % IV SOLN: 500.0000 mL | Freq: Once | INTRAVENOUS | Status: DC

## 2021-06-27 NOTE — Patient Instructions (Signed)
Information on polyps and hemorrhoids given to you today.  Await pathology results.  Resume previous diet and medications today except Xarelto - you may resume it TOMORROW.   YOU HAD AN ENDOSCOPIC PROCEDURE TODAY AT [facility]:   Refer to the procedure report that was given to you for any specific questions about what was found during the examination.  If the procedure report does not answer your questions, please call your gastroenterologist to clarify.  If you requested that your care partner not be given the details of your procedure findings, then the procedure report has been included in a sealed envelope for you to review at your convenience later.  YOU SHOULD EXPECT: Some feelings of bloating in the abdomen. Passage of more gas than usual.  Walking can help get rid of the air that was put into your GI tract during the procedure and reduce the bloating. If you had a lower endoscopy (such as a colonoscopy or flexible sigmoidoscopy) you may notice spotting of blood in your stool or on the toilet paper. If you underwent a bowel prep for your procedure, you may not have a normal bowel movement for a few days.  Please Note:  You might notice some irritation and congestion in your nose or some drainage.  This is from the oxygen used during your procedure.  There is no need for concern and it should clear up in a day or so.  SYMPTOMS TO REPORT IMMEDIATELY:  Following lower endoscopy (colonoscopy or flexible sigmoidoscopy):  Excessive amounts of blood in the stool  Significant tenderness or worsening of abdominal pains  Swelling of the abdomen that is new, acute  Fever of 100F or higher   For urgent or emergent issues, a gastroenterologist can be reached at any hour by calling [phone]. Do not use MyChart messaging for urgent concerns.    DIET:  We do recommend a small meal at first, but then you may proceed to your regular diet.  Drink plenty of fluids but you should  avoid alcoholic beverages for 24 hours.  ACTIVITY:  You should plan to take it easy for the rest of today and you should NOT DRIVE or use heavy machinery until tomorrow (because of the sedation medicines used during the test).    FOLLOW UP: Our staff will call the number listed on your records 48-72 hours following your procedure to check on you and address any questions or concerns that you may have regarding the information given to you following your procedure. If we do not reach you, we will leave a message.  We will attempt to reach you two times.  During this call, we will ask if you have developed any symptoms of COVID 19. If you develop any symptoms (ie: fever, flu-like symptoms, shortness of breath, cough etc.) before then, please call [phone].  If you test positive for Covid 19 in the 2 weeks post procedure, please call and report this information to [place].    If any biopsies were taken you will be contacted by phone or by letter within the next 1-3 weeks.  Please call us at [phone] if you have not heard about the biopsies in 3 weeks.    SIGNATURES/CONFIDENTIALITY: You and/or your care partner have signed paperwork which will be entered into your electronic medical record.  These signatures attest to the fact that that the information above on your After Visit Summary has been reviewed and is understood.  Full responsibility of the  confidentiality of this discharge information lies with you and/or your care-partner.

## 2021-06-27 NOTE — Op Note (Addendum)
[facility] Patient Name: Marco Morris Procedure Date: 06/27/2021 9:43 AM MRN: [id number] Endoscopist: Milus Banister , MD Age: 66 Referring MD:  Date of Birth: 08/05/1955 Gender: Male Account #: [id number] Procedure:                Colonoscopy Indications:              Chronic diarrhea, left sided abd pains, Father had                            colon cancer in his 107s Medicines:                Monitored Anesthesia Care Procedure:                Pre-Anesthesia Assessment:                           - Prior to the procedure, a History and Physical                            was performed, and patient medications and                            allergies were reviewed. The patient's tolerance of                            previous anesthesia was also reviewed. The risks                            and benefits of the procedure and the sedation                            options and risks were discussed with the patient.                            All questions were answered, and informed consent                            was obtained. Prior Anticoagulants: The patient has                            taken Xarelto (rivaroxaban), last dose was 2 days                            prior to procedure. ASA Grade Assessment: III - A                            patient with severe systemic disease. After                            reviewing the risks and benefits, the patient was                            deemed in satisfactory condition to undergo the  procedure.                           After obtaining informed consent, the colonoscope                            was passed under direct vision. Throughout the                            procedure, the patient's blood pressure, pulse, and                            oxygen saturations were monitored continuously. The                            CF [id number] #[id number] was introduced through the anus                             and advanced to the the cecum, identified by                            appendiceal orifice and ileocecal valve. The                            colonoscopy was performed without difficulty. The                            patient tolerated the procedure well. The quality                            of the bowel preparation was good. The terminal                            ileum, ileocecal valve, appendiceal orifice, and                            rectum were photographed. Scope In: 9:57:16 AM Scope Out: 10:14:45 AM Scope Withdrawal Time: 0 hours 10 minutes 51 seconds  Total Procedure Duration: 0 hours 17 minutes 29 seconds  Findings:                 Two sessile polyps were found in the transverse                            colon and cecum. The polyps were 3 to 9 mm in size.                            These polyps were removed with a cold snare.                            Resection and retrieval were complete.                           Biopsies for histology were taken with a cold  forceps from the entire colon for evaluation of                            microscopic colitis.                           External and internal hemorrhoids were found. The                            hemorrhoids were small.                           The exam was otherwise without abnormality on                            direct and retroflexion views. Complications:            No immediate complications. Estimated blood loss:                            None. Estimated Blood Loss:     Estimated blood loss: none. Impression:               - Two 3 to 9 mm polyps in the transverse colon and                            in the cecum, removed with a cold snare. Resected                            and retrieved.                           - External and internal hemorrhoids.                           - The examination was otherwise normal on direct                            and  retroflexion views.                           - Biopsies were taken with a cold forceps from the                            entire colon for evaluation of microscopic colitis. Recommendation:           - Patient has a contact number available for                            emergencies. The signs and symptoms of potential                            delayed complications were discussed with the                            patient. Return to normal activities tomorrow.  Written discharge instructions were provided to the                            patient.                           - Resume previous diet.                           - Continue present medications. You can resume your                            xarelto TOMORROW.                           - Await pathology results.                           - Recall colonoscopy in 5 years given family                            history of CRC. Milus Banister, MD 06/27/2021 10:18:34 AM This report has been signed electronically.

## 2021-06-27 NOTE — Progress Notes (Signed)
°  The recent H&P (dated 06/01/2021) was reviewed, the patient was examined and there is no change in the patients condition since that H&P was completed.   Marco Morris  06/27/2021, 9:45 AM   He did not have a liver laceration as was written on recent phone note.  It was a 'fatty liver"

## 2021-06-27 NOTE — Progress Notes (Signed)
Pt's states no medical or surgical changes since previsit or office visit.  ° °VS DT °

## 2021-06-27 NOTE — Progress Notes (Signed)
To pacu, VSS. Report to [email or web address].tb °

## 2021-06-27 NOTE — Progress Notes (Signed)
Called to room to assist during endoscopic procedure.  Patient ID and intended procedure confirmed with present staff. Received instructions for my participation in the procedure from the performing physician.  

## 2021-06-29 ENCOUNTER — Telehealth: Payer: Self-pay

## 2021-06-29 ENCOUNTER — Other Ambulatory Visit: Payer: Self-pay

## 2021-06-29 MED ORDER — LOPERAMIDE HCL 2 MG PO TABS: 2.0000 mg | ORAL_TABLET | Freq: Every morning | ORAL | 0 refills | Status: AC

## 2021-06-29 NOTE — Telephone Encounter (Signed)
°  Follow up Call-  Call back number 06/27/2021  Post procedure Call Back phone  # [phone]  Permission to leave phone message Yes  Some recent data might be hidden     Patient questions:  Do you have a fever, pain , or abdominal swelling? No. Pain Score  0 *  Have you tolerated food without any problems? Yes.    Have you been able to return to your normal activities? Yes.    Do you have any questions about your discharge instructions: Diet   No. Medications  No. Follow up visit  No.  Do you have questions or concerns about your Care? No.  Actions: * If pain score is 4 or above: No action needed, pain <4.   Have you developed a fever since your procedure? no  2.   Have you had an respiratory symptoms (SOB or cough) since your procedure? no  3.   Have you tested positive for COVID 19 since your procedure no  4.   Have you had any family members/close contacts diagnosed with the COVID 19 since your procedure?  no   If yes to any of these questions please route to Joylene John, RN and Joella Prince, RN

## 2021-07-01 ENCOUNTER — Other Ambulatory Visit: Payer: Self-pay | Admitting: Internal Medicine

## 2021-07-11 NOTE — Progress Notes (Unsigned)
Electrophysiology Office Note Date: 07/11/2021  ID:  Marco Morris, DOB [birth year], MRN [id number]  PCP: Jilda Panda, MD Primary Cardiologist: None Electrophysiologist: [name], MD ***  CC: Pacemaker follow-up  Marco Morris is a 66 y.o. male seen today for [name], MD for {Blank single:19197::"cardiac clearance","post hospital follow up","acute visit due to ***","routine electrophysiology followup"}.  Since {Blank single:19197::"last being seen in our clinic","discharge from hospital"} the patient reports doing ***.  he denies chest pain, palpitations, dyspnea, PND, orthopnea, nausea, vomiting, dizziness, syncope, edema, weight gain, or early satiety.  Device History: St. Jude Leadless PPM implanted 2015, gen change 2021 for sinus brady  Past Medical History:  Diagnosis Date   Blood dyscrasia    high risk for blood clot formation   Chronic kidney disease    History of renal calculi    Hypercholesterolemia    Hypertension    Hypothyroidism    Neuromuscular disorder (HCC)    numbness left foot   Pacemaker battery depletion    PAF (paroxysmal atrial fibrillation) ([place])    a.  anticoagulated with Xarelto b. s/p Tikosyn loading 07/2013 c. s/p PVI   Symptomatic bradycardia    Tachycardia-bradycardia syndrome ([place])    a. post termination pauses b. s/p STJ leadless pacemaker   Past Surgical History:  Procedure Laterality Date   ATRIAL FIBRILLATION ABLATION [name] 10/09/2013   Procedure: ATRIAL FIBRILLATION ABLATION;  Surgeon: Coralyn Mark, MD;  Location: [facility] CATH LAB;  Service: Cardiovascular;  Laterality: [name];   CHOLECYSTECTOMY  ~ 2000   LITHOTRIPSY  2004; ~ 05/2013   PACEMAKER IMPLANT [name] 10/23/2019   Procedure: PACEMAKER IMPLANT;  Surgeon: [name], MD;  Location: [facility] CV LAB;  Service: Cardiovascular;  Laterality: [name];   PERMANENT PACEMAKER INSERTION [name] 07/18/2013   STJ Leadless pacemaker implanted by Dr Rayann Heman for symptomatic pauses   PPM GENERATOR  REMOVAL [name] 10/23/2019   Procedure: PPM GENERATOR REMOVAL;  Surgeon: [name], MD;  Location: [facility] CV LAB;  Service: Cardiovascular;  Laterality: [name];   TEE WITHOUT CARDIOVERSION [name] 10/08/2013   Procedure: TRANSESOPHAGEAL ECHOCARDIOGRAM (TEE);  Surgeon: Sanda Klein, MD;  [facility] ENDOSCOPY;  Service: Cardiovascular;  Laterality: [name];    Current Outpatient Medications  Medication Sig Dispense Refill   albuterol (VENTOLIN HFA) 108 (90 Base) MCG/ACT inhaler Inhale 2 puffs into the lungs every 4 (four) hours as needed for wheezing or shortness of breath. 24 g 2   atorvastatin (LIPITOR) 20 MG tablet TAKE 1 TABLET EVERY EVENING 90 tablet 2   budesonide-formoterol (SYMBICORT) 160-4.5 MCG/ACT inhaler USE 2 INHALATIONS TWICE A DAY 30.6 g 3   EPINEPHrine 0.3 mg/0.3 mL IJ SOAJ injection Inject 0.3 mg into the muscle as needed for anaphylaxis. 1 each 1   levocetirizine (XYZAL) 5 MG tablet Take 5 mg by mouth daily after breakfast.      levothyroxine (SYNTHROID, LEVOTHROID) 125 MCG tablet Take 125 mcg by mouth daily before breakfast.     lisinopril (PRINIVIL,ZESTRIL) 10 MG tablet TAKE 1 TABLET (10 MG TOTAL) BY MOUTH DAILY. 30 tablet 0   loperamide (IMODIUM A-D) 2 MG tablet Take 1 tablet (2 mg total) by mouth every morning. 30 tablet 0   Mepolizumab (NUCALA) 100 MG/ML SOAJ INJECT 100 MG UNDER THE SKIN EVERY 28 DAYS 1 mL 11   omeprazole (PRILOSEC) 20 MG capsule TAKE 1 CAPSULE BY MOUTH EVERY DAY 30 capsule 9   potassium chloride SA (KLOR-CON) 20 MEQ tablet Take 20 mEq by mouth daily after  breakfast.      rivaroxaban (XARELTO) 20 MG TABS tablet TAKE 1 TABLET DAILY WITH SUPPER 90 tablet 2   Tiotropium Bromide Monohydrate (SPIRIVA RESPIMAT) 2.5 MCG/ACT AERS Inhale 2 puffs into the lungs daily. 2 puff once per day     No current facility-administered medications for this visit.    Allergies:   Codeine   Social History: Social History   Socioeconomic History   Marital status: Married     Spouse name: Not on file   Number of children: 2   Years of education: Not on file   Highest education level: Not on file  Occupational History   Occupation: DRIVER    Employer: [employer]  Tobacco Use   Smoking status: Former    Packs/day: 1.75    Years: 8.00    Pack years: 14.00    Types: Cigarettes    Start date: 4    Quit date: 05/15/1977    Years since quitting: 44.1   Smokeless tobacco: Former    Types: Chew    Quit date: 01/13/2013  Vaping Use   Vaping Use: Never used  Substance and Sexual Activity   Alcohol use: Yes    Alcohol/week: 4.0 standard drinks    Types: 4 Cans of beer per week   Drug use: No   Sexual activity: Not Currently  Other Topics Concern   Not on file  Social History Narrative   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   Social Determinants of Health   Financial Resource Strain: Not on file  Food Insecurity: Not on file  Transportation Needs: Not on file  Physical Activity: Not on file  Stress: Not on file  Social Connections: Not on file  Intimate Partner Violence: Not on file    Family History: Family History  Problem Relation Age of Onset   Heart attack Mother    Stroke Mother    Diabetes Mother    Cancer - Ovarian Mother    Leukemia Mother    Heart attack Father    Stroke Father    Diabetes Father    Cancer - Other Father    Colon cancer Father    Hypertension Brother      Review of Systems: All other systems reviewed and are otherwise negative except as noted above.  Physical Exam: There were no vitals filed for this visit.   GEN- The patient is well appearing, alert and oriented x 3 today.   HEENT: normocephalic, atraumatic; sclera clear, conjunctiva pink; hearing intact; oropharynx clear; neck supple  Lungs- Clear to ausculation bilaterally, normal work of breathing.  No wheezes, rales, rhonchi Heart- Regular rate and rhythm, no murmurs, rubs or gallops  GI- soft, non-tender, non-distended, bowel sounds  present  Extremities- no clubbing or cyanosis. No edema MS- no significant deformity or atrophy Skin- warm and dry, no rash or lesion; PPM pocket well healed Psych- euthymic mood, full affect Neuro- strength and sensation are intact  PPM Interrogation- reviewed in detail today,  See PACEART report  EKG:  EKG {ACTION; IS/IS [id number]:[id number]} ordered today. Personal review of ekg ordered {Blank single:19197::"today","***"} shows ***   Recent Labs: 12/26/2020: BUN 14; Creatinine, Ser 1.11; Hemoglobin 14.6; Platelets 157; Potassium 4.4; Sodium 139   Wt Readings from Last 3 Encounters:  06/27/21 191 lb (86.6 kg)  06/01/21 191 lb 6.4 oz (86.8 kg)  01/20/21 189 lb 12.8 oz (86.1 kg)     Other studies Reviewed: Additional studies/ records that were reviewed today include:  Previous EP office notes, Previous remote checks, Most recent labwork.   Assessment and Plan:  1. Symptomatic bradycardia s/p St. Jude Leadless PPM  Normal PPM function See Pace Art report No changes today   2. Paroxysmal atrial fibrillation Well controlled post ablation Continue Xarelto for CHA2DS2-VASc of at least 2. Previous palpitations showed rare and short NS-AT re: palpitations Pt also has h/o blood dyscrasias carrying a "risk for thromboembolism". He has not had stroke, DVT, or PE to his knowledge.   3. HTN Stable on current regimen    4. COPD Chronic, SOB is stable   5. Atypical chest pains Sharp, infrequent. No exertional component.  CXR 12/26/20 unremarkable. Labs unremarkable including negative HS-trop. Echo 01/2021 LVEF 55-60% Myoview 07/2018 with normal EF and no ischemia. Will update if echo abnormal. Reviewed with Industry and VP 0% and no auto-tests that would potentially cause symptoms   Current medicines are reviewed at length with the patient today.    Labs/ tests ordered today include: *** No orders of the defined types were placed in this encounter.    Disposition:   Follow up with  {Blank single:19197::"Dr. Allred","Dr. Arlan Organ. Klein","Dr. Camnitz","Dr. Lambert","EP APP"} in {Blank single:19197::"2 weeks","4 weeks","3 months","6 months","12 months","as usual post gen change"}    Signed, Shirley Friar, PA-C  07/11/2021 1:35 PM  [place] [address] [phone] (office) [phone] (fax)

## 2021-07-12 ENCOUNTER — Encounter: Payer: TRICARE For Life (TFL) | Admitting: Student

## 2021-07-12 DIAGNOSIS — I10 Essential (primary) hypertension: Secondary | ICD-10-CM

## 2021-07-12 DIAGNOSIS — I480 Paroxysmal atrial fibrillation: Secondary | ICD-10-CM

## 2021-07-12 DIAGNOSIS — J449 Chronic obstructive pulmonary disease, unspecified: Secondary | ICD-10-CM

## 2021-07-12 DIAGNOSIS — I495 Sick sinus syndrome: Secondary | ICD-10-CM

## 2021-07-25 ENCOUNTER — Other Ambulatory Visit: Payer: Self-pay | Admitting: Internal Medicine

## 2021-08-03 NOTE — Progress Notes (Signed)
? ?Cardiology Office Note ?Date:  08/03/2021  ?Patient ID:  Marco Morris, Marco Morris 1955/06/15, MRN [id number] ?PCP:  Jilda Panda, MD  ?Electrophysiologist: Dr. Rayann Heman ? ?  ?Chief Complaint:  6 mo ? ?History of Present Illness: ?Marco Morris is a 67 y.o. male with history of HTN, HLD, AFib, tachy-brady w/PPM, COPD ? ?He comes in today to be seen for Dr. Rayann Heman.  Last seen by EP with AChalmers Cater, PA-C Aug 2022, had been having some CP, recent ER visit for the same, felt atypical, ER w/u unremarkable with neg HS Trop, myoview 2020 without ischemia, planned to update his echo and pursue ischemic evaluation if changed. ?Noted no pacing or auto tests via his device to explain his symptom. ? ?TODAY ?He is doing OK ?Of late having some GI trouble, recent colonoscopy, started on imodium, sees them again next week. ?He doesn't think he has been having much if any AF, though here today he feels like his heart is going fast/feels a quivering in his chest. ?With this, his HR is 70's and regular ?Rhythm today is sinus ?He has an infrequent sens of motion/movement type dizziness. ?Not lightheaded, no near syncope or syncope. ?No SOB ? ?No bleeding or signs of bleeding ? ?Device information ?A[employer]ott leadless PPM implanted 2015, original device explanted with new leadless PPM implant on 10/23/2019 ? ?AFib Hx ?Diagnosed 2013 ?Tikosyn 2015 >> stopped post ablation ?PVI ablation 10/09/2013 ? ? ?Past Medical History:  ?Diagnosis Date  ? Blood dyscrasia   ? high risk for blood clot formation  ? Chronic kidney disease   ? History of renal calculi   ? Hypercholesterolemia   ? Hypertension   ? Hypothyroidism   ? Neuromuscular disorder (HCC)   ? numbness left foot  ? Pacemaker battery depletion   ? PAF (paroxysmal atrial fibrillation) ([place])   ? a.  anticoagulated with Xarelto b. s/p Tikosyn loading 07/2013 c. s/p PVI  ? Symptomatic bradycardia   ? Tachycardia-bradycardia syndrome ([place])   ? a. post termination pauses b. s/p STJ leadless  pacemaker  ? ? ?Past Surgical History:  ?Procedure Laterality Date  ? ATRIAL FIBRILLATION ABLATION [name] 10/09/2013  ? Procedure: ATRIAL FIBRILLATION ABLATION;  Surgeon: Coralyn Mark, MD;  Location: [facility] CATH LAB;  Service: Cardiovascular;  Laterality: [name];  ? CHOLECYSTECTOMY  ~ 2000  ? LITHOTRIPSY  2004; ~ 05/2013  ? PACEMAKER IMPLANT [name] 10/23/2019  ? Procedure: PACEMAKER IMPLANT;  Surgeon: [name], MD;  Location: [facility] CV LAB;  Service: Cardiovascular;  Laterality: [name];  ? PERMANENT PACEMAKER INSERTION [name] 07/18/2013  ? STJ Leadless pacemaker implanted by Dr Rayann Heman for symptomatic pauses  ? PPM GENERATOR REMOVAL [name] 10/23/2019  ? Procedure: PPM GENERATOR REMOVAL;  Surgeon: [name], MD;  Location: [place] CV LAB;  Service: Cardiovascular;  Laterality: [name];  ? TEE WITHOUT CARDIOVERSION [name] 10/08/2013  ? Procedure: TRANSESOPHAGEAL ECHOCARDIOGRAM (TEE);  Surgeon: Sanda Klein, MD;  Location: [facility];  Service: Cardiovascular;  Laterality: [name];  ? ? ?Current Outpatient Medications  ?Medication Sig Dispense Refill  ? albuterol (VENTOLIN HFA) 108 (90 Base) MCG/ACT inhaler Inhale 2 puffs into the lungs every 4 (four) hours as needed for wheezing or shortness of breath. 24 g 2  ? atorvastatin (LIPITOR) 20 MG tablet TAKE 1 TABLET EVERY EVENING 90 tablet 2  ? budesonide-formoterol (SYMBICORT) 160-4.5 MCG/ACT inhaler USE 2 INHALATIONS TWICE A DAY 30.6 g 3  ? EPINEPHrine 0.3 mg/0.3 mL IJ SOAJ injection Inject 0.3 mg into the muscle  as needed for anaphylaxis. 1 each 1  ? levocetirizine (XYZAL) 5 MG tablet Take 5 mg by mouth daily after breakfast.     ? levothyroxine (SYNTHROID, LEVOTHROID) 125 MCG tablet Take 125 mcg by mouth daily before breakfast.    ? lisinopril (PRINIVIL,ZESTRIL) 10 MG tablet TAKE 1 TABLET (10 MG TOTAL) BY MOUTH DAILY. 30 tablet 0  ? loperamide (IMODIUM A-D) 2 MG tablet Take 1 tablet (2 mg total) by mouth every morning. 30 tablet 0  ? Mepolizumab (NUCALA) 100 MG/ML SOAJ INJECT 100 MG  UNDER THE SKIN EVERY 28 DAYS 1 mL 11  ? omeprazole (PRILOSEC) 20 MG capsule TAKE 1 CAPSULE BY MOUTH EVERY DAY 30 capsule 9  ? potassium chloride SA (KLOR-CON) 20 MEQ tablet Take 20 mEq by mouth daily after breakfast.     ? rivaroxaban (XARELTO) 20 MG TABS tablet TAKE 1 TABLET DAILY WITH SUPPER 90 tablet 2  ? Tiotropium Bromide Monohydrate (SPIRIVA RESPIMAT) 2.5 MCG/ACT AERS Inhale 2 puffs into the lungs daily. 2 puff once per day    ? ?No current facility-administered medications for this visit.  ? ? ?Allergies:   Codeine  ? ?Social History:  The patient  reports that he quit smoking about 44 years ago. His smoking use included cigarettes. He started smoking about 52 years ago. He has a 14.00 pack-year smoking history. He quit smokeless tobacco use about 8 years ago.  His smokeless tobacco use included chew. He reports current alcohol use of about 4.0 standard drinks per week. He reports that he does not use drugs.  ? ?Family History:  The patient's family history includes Cancer - Other in his father; Cancer - Ovarian in his mother; Colon cancer in his father; Diabetes in his father and mother; Heart attack in his father and mother; Hypertension in his brother; Leukemia in his mother; Stroke in his father and mother. ? ?ROS:  Please see the history of present illness.    ?All other systems are reviewed and otherwise negative.  ? ?PHYSICAL EXAM:  ?VS:  There were no vitals taken for this visit. BMI: There is no height or weight on file to calculate BMI. ?Well nourished, well developed, in no acute distress ?HEENT: normocephalic, atraumatic ?Neck: no JVD, carotid bruits or masses ?Cardiac:  RRR; no significant murmurs, no rubs, or gallops ?Lungs:  CTA b/l, no wheezing, rhonchi or rales ?Abd: soft, nontender ?MS: no deformity or atrophy ?Ext: no edema ?Skin: warm and dry, no rash ?Neuro:  No gross deficits appreciated ?Psych: euthymic mood, full affect ? ? ? ?EKG:  not done today ? ?Device interrogation done today and  reviewed by myself:  ?Battery estimate is 23 years ?VP zero% ?EGM is clearly sinus ? ?01/24/2021: TTE ?1. Left ventricular ejection fraction, by estimation, is 55 to 60%. The  ?left ventricle has normal function. The left ventricle has no regional  ?wall motion abnormalities. Left ventricular diastolic parameters are  ?consistent with Grade I diastolic  ?dysfunction (impaired relaxation). The average left ventricular global  ?longitudinal strain is -20.4 %. The global longitudinal strain is normal.  ? 2. Peak RV-RA gradient 22 mmHg. IVC not visualized. Right ventricular  ?systolic function is normal. The right ventricular size is normal.  ? 3. The mitral valve is normal in structure. No evidence of mitral valve  ?regurgitation. No evidence of mitral stenosis.  ? 4. The aortic valve is tricuspid. Aortic valve regurgitation is trivial.  ?No aortic stenosis is present.  ? 5. Aortic dilatation noted.  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  ? ?10/09/2013: EPS/ablation ?CONCLUSIONS: ?1. Sinus rhythm upon presentation.   ?2. Rotational Angiography reveals a moderate sized left atrium with a short common segment to the left superior and inferior pulmonary veins. ?3. Successful electrical isolation and anatomical encircling of all four pulmonary veins with radiofrequency current. ?4. No inducible arrhythmias following ablation both on and off of dobutamine ?5. No early apparent complications. ? ?Recent Labs: ?12/26/2020: BUN 14; Creatinine, Ser 1.11; Hemoglobin 14.6; Platelets 157; Potassium 4.4; Sodium 139  ?No results found for requested labs within last 8760 hours.  ? ?CrCl cannot be calculated (Patient's most recent lab result is older than the maximum 21 days allowed.).  ? ?Wt Readings from Last 3 Encounters:  ?06/27/21 191 lb (86.6 kg)  ?06/01/21 191 lb 6.4 oz (86.8 kg)  ?01/20/21 189 lb 12.8 oz (86.1 kg)  ?  ? ?Other studies reviewed: ?Additional studies/records reviewed today include:  summarized above ? ?ASSESSMENT AND PLAN: ? ?PPM ?Intact function ?No programming changes made ? ?Paroxysmal Afib ?CHA2DS2Vasc is 2, on Xarelto, appropriately dosed ?Minimal if any burden by symptoms ? ?With symptom

## 2021-08-05 ENCOUNTER — Other Ambulatory Visit: Payer: Self-pay

## 2021-08-05 ENCOUNTER — Ambulatory Visit (INDEPENDENT_AMBULATORY_CARE_PROVIDER_SITE_OTHER): Payer: Medicare PPO | Admitting: Physician Assistant

## 2021-08-05 ENCOUNTER — Encounter: Payer: Self-pay | Admitting: Physician Assistant

## 2021-08-05 VITALS — BP 130/80 | HR 79 | Ht 72.0 in | Wt 189.6 lb

## 2021-08-05 DIAGNOSIS — I10 Essential (primary) hypertension: Secondary | ICD-10-CM | POA: Diagnosis not present

## 2021-08-05 DIAGNOSIS — Z950 Presence of cardiac pacemaker: Secondary | ICD-10-CM | POA: Diagnosis not present

## 2021-08-05 DIAGNOSIS — I480 Paroxysmal atrial fibrillation: Secondary | ICD-10-CM | POA: Diagnosis not present

## 2021-08-05 LAB — CUP PACEART INCLINIC DEVICE CHECK
Date Time Interrogation Session: 20230324121825
Implantable Pulse Generator Implant Date: 20210610
Pulse Gen Serial Number: 1304509

## 2021-08-05 NOTE — Patient Instructions (Signed)
Medication Instructions:  ? ?Your physician recommends that you continue on your current medications as directed. Please refer to the Current Medication list given to you today. ? ?*If you need a refill on your cardiac medications before your next appointment, please call your pharmacy* ? ? ?Lab Work: [place] ? ? ?If you have labs (blood work) drawn today and your tests are completely normal, you will receive your results only by: ?MyChart Message (if you have MyChart) OR ?A paper copy in the mail ?If you have any lab test that is abnormal or we need to change your treatment, we will call you to review the results. ? ? ?Testing/Procedures: NONE ORDERED  TODAY ? ? ? ?Follow-Up: ?At [facility], you and your health needs are our priority.  As part of our continuing mission to provide you with exceptional heart care, we have created designated Provider Care Teams.  These Care Teams include your primary Cardiologist (physician) and Advanced Practice Providers (APPs -  Physician Assistants and Nurse Practitioners) who all work together to provide you with the care you need, when you need it. ? ?We recommend signing up for the patient portal called "MyChart".  Sign up information is provided on this After Visit Summary.  MyChart is used to connect with patients for Virtual Visits (Telemedicine).  Patients are able to view lab/test results, encounter notes, upcoming appointments, etc.  Non-urgent messages can be sent to your provider as well.   ?To learn more about what you can do with MyChart, go to [email or web address].   ? ?Your next appointment:   ?6 month(s) ? ?The format for your next appointment:   ?In Person ? ?Provider:   ?You may see [name], MD or one of the following Advanced Practice Providers on your designated Care Team:   ?Tommye Standard, PA-C ?Legrand Como "Jonni Sanger" Gifford, PA-C  ? ? ?Other Instructions ? ?

## 2021-08-10 ENCOUNTER — Encounter: Payer: Self-pay | Admitting: Gastroenterology

## 2021-08-10 ENCOUNTER — Other Ambulatory Visit: Payer: Medicare PPO

## 2021-08-10 ENCOUNTER — Ambulatory Visit (INDEPENDENT_AMBULATORY_CARE_PROVIDER_SITE_OTHER): Payer: Medicare PPO | Admitting: Gastroenterology

## 2021-08-10 VITALS — BP 120/70 | HR 77 | Ht 73.0 in | Wt 189.0 lb

## 2021-08-10 DIAGNOSIS — R197 Diarrhea, unspecified: Secondary | ICD-10-CM | POA: Diagnosis not present

## 2021-08-10 DIAGNOSIS — R109 Unspecified abdominal pain: Secondary | ICD-10-CM

## 2021-08-10 NOTE — Patient Instructions (Signed)
If you are age 68 or older, your body mass index should be between 23-30. Your Body mass index is 24.94 kg/m?Marland Kitchen If this is out of the aforementioned range listed, please consider follow up with your Primary Care Provider. ?________________________________________________________ ? ?The [place] [facility] providers would like to encourage you to use [facility] to communicate with providers for non-urgent requests or questions.  Due to long hold times on the telephone, sending your provider a message by [facility] may be a faster and more efficient way to get a response.  Please allow 48 business hours for a response.  Please remember that this is for non-urgent requests.  ?_______________________________________________________ ? ?Your provider has requested that you go to the basement level for lab work before leaving today. Press "B" on the elevator. The lab is located at the first door on the left as you exit the elevator. ? ?You have been scheduled for an abdominal ultrasound at [facility] Radiology (1st floor of hospital) on 08-17-21 at 10am. Please arrive 15 minutes prior to your appointment for registration. Make certain not to have anything to eat or drink after midnight the night prior to your appointment. Should you need to reschedule your appointment, please contact radiology at [phone]. This test typically takes about 30 minutes to perform. ? ?Due to recent changes in healthcare laws, you may see the results of your imaging and laboratory studies on MyChart before your provider has had a chance to review them.  We understand that in some cases there may be results that are confusing or concerning to you. Not all laboratory results come back in the same time frame and the provider may be waiting for multiple results in order to interpret others.  Please give [place] 48 hours in order for your provider to thoroughly review all the results before contacting the office for clarification of your results.  ? ?Thank you for  entrusting me with your care and choosing [facility]. ? ?Dr Ardis Hughs ? ?

## 2021-08-10 NOTE — Progress Notes (Signed)
Review of pertinent gastrointestinal problems: ?1.  Adenomatous colon polyps.  Colonoscopy 06/2021 found 2 subcentimeter adenomas.  Also his father had colon cancer in his 21s.  Repeat colonoscopy 2028 ?2.  Change in bowel habits late 2022.  Lab work-up infectious stool testing all negative including C. difficile, TSH normal, CBC normal.  Colonoscopy due 2023 found 2 subcentimeter polyps, random colon biopsies that showed no sign of microscopic colitis.  I recommended that he try Imodium on a scheduled basis ? ? ?HPI: ?This is a very pleasant 66 year old man who is here with his wife today. ? ?He had a change in bowel starting 3 to 4 months ago.  Prior to this change he did move his bowels once or twice per week.  Then he was having 3-4 loose stools daily.  He took Imodium every single day for about 2 weeks and became quite constipated.  Now he takes it on a as needed basis and that ends up being 3-4 times per week. ? ?His weight is stable ? ?He does have minor intermittent lower abdominal cramping pains that do not seem to be improved when he moves his bowels. ? ? ?CT "urogram" last month:  ?4. Geographic region of hypodensity along the peripheral and ?superior aspect of the right lobe of the liver is nonspecific but ?may reflect fatty infiltration. Consider correlation with laboratory ?values (hepatic function) and if more definitive characterization is ?clinically indicated this could be accomplished with hepatic ?protocol MRI with and without contrast.  ?ROS: complete GI ROS as described in HPI, all other review negative. ? ?Constitutional:  No unintentional weight loss ? ? ?Past Medical History:  ?Diagnosis Date  ? Blood dyscrasia   ? high risk for blood clot formation  ? Chronic kidney disease   ? History of renal calculi   ? Hypercholesterolemia   ? Hypertension   ? Hypothyroidism   ? Neuromuscular disorder (HCC)   ? numbness left foot  ? Pacemaker battery depletion   ? PAF (paroxysmal atrial fibrillation)  ([place])   ? a.  anticoagulated with Xarelto b. s/p Tikosyn loading 07/2013 c. s/p PVI  ? Symptomatic bradycardia   ? Tachycardia-bradycardia syndrome ([place])   ? a. post termination pauses b. s/p STJ leadless pacemaker  ? ? ?Past Surgical History:  ?Procedure Laterality Date  ? ATRIAL FIBRILLATION ABLATION [name] 10/09/2013  ? Procedure: ATRIAL FIBRILLATION ABLATION;  Surgeon: Coralyn Mark, MD;  Location: [facility];  Service: Cardiovascular;  Laterality: [name];  ? CHOLECYSTECTOMY  ~ 2000  ? LITHOTRIPSY  2004; ~ 05/2013  ? PACEMAKER IMPLANT [name] 10/23/2019  ? Procedure: PACEMAKER IMPLANT;  Surgeon: [name], MD;  Location: [facility];  Service: Cardiovascular;  Laterality: [name];  ? PERMANENT PACEMAKER INSERTION [name] 07/18/2013  ? STJ Leadless pacemaker implanted by Dr Rayann Heman for symptomatic pauses  ? PPM GENERATOR REMOVAL [name] 10/23/2019  ? Procedure: PPM GENERATOR REMOVAL;  Surgeon: [name], MD;  Location: [place];  Service: Cardiovascular;  Laterality: [name];  ? TEE WITHOUT CARDIOVERSION [name] 10/08/2013  ? Procedure: TRANSESOPHAGEAL ECHOCARDIOGRAM (TEE);  Surgeon: Sanda Klein, MD;  Location: [facility];  Service: Cardiovascular;  Laterality: [name];  ? ? ?Current Outpatient Medications  ?Medication Instructions  ? albuterol (VENTOLIN HFA) 108 (90 Base) MCG/ACT inhaler 2 puffs, Inhalation, Every 4 hours PRN  ? atorvastatin (LIPITOR) 20 MG tablet TAKE 1 TABLET EVERY EVENING  ? budesonide-formoterol (SYMBICORT) 160-4.5 MCG/ACT inhaler USE 2 INHALATIONS TWICE A DAY  ? EPINEPHrine (EPI-PEN) 0.3  mg, Intramuscular, As needed  ? levocetirizine (XYZAL) 5 mg, Oral, Daily after breakfast  ? levothyroxine (SYNTHROID) 125 mcg, Oral, Daily before breakfast  ? lisinopril (PRINIVIL,ZESTRIL) 10 MG tablet TAKE 1 TABLET (10 MG TOTAL) BY MOUTH DAILY.  ? loperamide (IMODIUM A-D) 2 mg, Oral, Every morning  ? Mepolizumab (NUCALA) 100 MG/ML SOAJ INJECT 100 MG UNDER THE SKIN EVERY 28 DAYS  ? omeprazole (PRILOSEC) 20 MG capsule  TAKE 1 CAPSULE BY MOUTH EVERY DAY  ? potassium chloride SA (KLOR-CON) 20 MEQ tablet 20 mEq, Oral, Daily after breakfast  ? rivaroxaban (XARELTO) 20 MG TABS tablet TAKE 1 TABLET DAILY WITH SUPPER  ? Tiotropium Bromide Monohydrate (SPIRIVA RESPIMAT) 2.5 MCG/ACT AERS 2 puffs, Inhalation, Daily, 2 puff once per day   ? ? ?Allergies as of 08/10/2021 - Review Complete 08/10/2021  ?Allergen Reaction Noted  ? Codeine Nausea And Vomiting 10/27/2010  ? ? ?Family History  ?Problem Relation Age of Onset  ? Heart attack Mother   ? Stroke Mother   ? Diabetes Mother   ? Cancer - Ovarian Mother   ? Leukemia Mother   ? Heart attack Father   ? Stroke Father   ? Diabetes Father   ? Cancer - Other Father   ? Colon cancer Father   ? Hypertension Brother   ? ? ?Social History  ? ?Socioeconomic History  ? Marital status: Married  ?  Spouse name: Not on file  ? Number of children: 2  ? Years of education: Not on file  ? Highest education level: Not on file  ?Occupational History  ? Occupation: DRIVER  ?  Employer: [employer]  ?Tobacco Use  ? Smoking status: Former  ?  Packs/day: 1.75  ?  Years: 8.00  ?  Pack years: 14.00  ?  Types: Cigarettes  ?  Start date: 66  ?  Quit date: 05/15/1977  ?  Years since quitting: 44.2  ? Smokeless tobacco: Former  ?  Types: Chew  ?  Quit date: 01/13/2013  ?Vaping Use  ? Vaping Use: Never used  ?Substance and Sexual Activity  ? Alcohol use: Yes  ?  Alcohol/week: 4.0 standard drinks  ?  Types: 4 Cans of beer per week  ? Drug use: No  ? Sexual activity: Not Currently  ?Other Topics Concern  ? Not on file  ?Social History Narrative  ? 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  ? ?Social Determinants of Health  ? ?Financial Resource Strain: Not on file  ?Food Insecurity: Not on file  ?Transportation Needs: Not on file  ?Physical Activity: Not on file  ?Stress: Not on file  ?Social Connections: Not on file  ?Intimate Partner Violence: Not on file  ? ? ? ?Physical Exam: ?Ht '6\' 1"'$  (1.854 m)   Wt 189 lb (85.7  kg)   BMI 24.94 kg/m?  ?Constitutional: generally well-appearing ?Psychiatric: alert and oriented x3 ?Abdomen: soft, nontender, nondistended, no obvious ascites, no peritoneal signs, normal bowel sounds ?No peripheral edema noted in lower extremities ? ?Assessment and plan: ?66 y.o. male with change in bowels, loose stools, intermittent lower abdominal pains ? ?I recommended we do a little bit more testing to make sure we are not missing anything else that could have caused his change in bowel habits.  Specifically he will get serologies for celiac sprue and then I recommended abdominal imaging.  He has borderline kidney function from what he tells me and I think it can be safest to start imaging with  full abdominal ultrasound. ? ?He understands that if these 2 results are unrevealing, normal then I would probably just recommend he continue taking Imodium on a as needed basis and to call me if things get significant worse for new GI symptoms arise ? ?Please see the "Patient Instructions" section for addition details about the plan. ? ?Owens Loffler, MD ?[facility] ?08/10/2021, 9:38 AM ? ? ?Total time on date of encounter was 25 minutes (this included time spent preparing to see the patient reviewing records; obtaining and/or reviewing separately obtained history; performing a medically appropriate exam and/or evaluation; counseling and educating the patient and family if present; ordering medications, tests or procedures if applicable; and documenting clinical information in the health record). ? ?

## 2021-08-12 LAB — IGA: Immunoglobulin A: 238 mg/dL (ref 70–320)

## 2021-08-12 LAB — TISSUE TRANSGLUTAMINASE, IGA: (tTG) Ab, IgA: <1 U/mL

## 2021-08-17 ENCOUNTER — Ambulatory Visit (HOSPITAL_COMMUNITY)
Admission: RE | Admit: 2021-08-17 | Discharge: 2021-08-17 | Disposition: A | Discharge status: Home / Self Care | Payer: Medicare PPO | Source: Ambulatory Visit | Attending: Gastroenterology | Admitting: Gastroenterology

## 2021-08-17 DIAGNOSIS — R109 Unspecified abdominal pain: Secondary | ICD-10-CM | POA: Insufficient documentation

## 2021-10-06 ENCOUNTER — Encounter: Payer: Self-pay | Admitting: Pulmonary Disease

## 2021-10-06 ENCOUNTER — Ambulatory Visit (INDEPENDENT_AMBULATORY_CARE_PROVIDER_SITE_OTHER): Payer: Medicare PPO | Admitting: Pulmonary Disease

## 2021-10-06 VITALS — BP 140/78 | HR 78 | Temp 98.0°F | Ht 72.0 in | Wt 179.4 lb

## 2021-10-06 DIAGNOSIS — J4550 Severe persistent asthma, uncomplicated: Secondary | ICD-10-CM

## 2021-10-06 MED ORDER — BUDESONIDE-FORMOTEROL FUMARATE 80-4.5 MCG/ACT IN AERO: 1.0000 | INHALATION_SPRAY | Freq: Two times a day (BID) | RESPIRATORY_TRACT | 3 refills | Status: DC

## 2021-10-06 NOTE — Addendum Note (Signed)
Addended by: Elton Sin on: 10/06/2021 09:12 AM   Modules accepted: Orders

## 2021-10-06 NOTE — Patient Instructions (Signed)
I'm glad you are doing well with regard to breathing will reduce the Symbicort  to 80/4.5. Take one puff twice daily continue inhalers and injection therapy. Follow-up in six months

## 2021-10-06 NOTE — Progress Notes (Signed)
Marco Morris    [id number]    10-09-1955  Primary Care Physician:Moreira, Carloyn Manner, MD  Referring Physician: Jilda Panda, MD [address] [facility] [place],  [place] [address]  Chief complaint: Follow-up for COPD, asthma Started nucala December 4952  HPI: 66 year old with history of atrial fibrillation, hyperlipidemia, hypothyroidism, hypertension. He has complaints of dyspnea with activity and rest for the past several years which is getting worse. He has non-productive cough with no sputum production. Denies any wheeze, fevers, chills. He has history of atrial fibrillation status post ablation in 2015 and has been stable since then. He has history of GERD. Symptoms are well controlled on Zantac. There are no seasonal allergies, postnasal drip. He is physically active and does regular exercise with strength training, running and swimming 3 times a week In spite of the symptoms he continues to have an active lifestyle running a couple of miles every other day, swimming and exercising. Recently had a pacemaker placed  Pets:None Occupation: Truck driver Exposures: None. No mold at home Smoking history:15-pack-year smoking history. Quit in 1979 Travel History: No recent travel except for work  Interim history: Continues on inhalers.  Started on Nucala in December 2021 He notes some improvement in breathing with the new biologic Continues on Symbicort, Spiriva and Xyzal. His dose was reduced to 1 puff twice daily at last visit  Breathing is stable.  He hardly needs to use his rescue inhaler Continues to stay active running 3 miles and swimming several times a week  Outpatient Encounter Medications as of 10/06/2021  Medication Sig   albuterol (VENTOLIN HFA) 108 (90 Base) MCG/ACT inhaler Inhale 2 puffs into the lungs every 4 (four) hours as needed for wheezing or shortness of breath.   atorvastatin (LIPITOR) 20 MG tablet TAKE 1 TABLET EVERY EVENING   budesonide-formoterol (SYMBICORT)  160-4.5 MCG/ACT inhaler USE 2 INHALATIONS TWICE A DAY   EPINEPHrine 0.3 mg/0.3 mL IJ SOAJ injection Inject 0.3 mg into the muscle as needed for anaphylaxis.   levocetirizine (XYZAL) 5 MG tablet Take 5 mg by mouth daily after breakfast.    levothyroxine (SYNTHROID, LEVOTHROID) 125 MCG tablet Take 125 mcg by mouth daily before breakfast.   lisinopril (PRINIVIL,ZESTRIL) 10 MG tablet TAKE 1 TABLET (10 MG TOTAL) BY MOUTH DAILY.   loperamide (IMODIUM A-D) 2 MG tablet Take 1 tablet (2 mg total) by mouth every morning. (Patient taking differently: Take 2 mg by mouth every morning. Patient takes as needed.)   Mepolizumab (NUCALA) 100 MG/ML SOAJ INJECT 100 MG UNDER THE SKIN EVERY 28 DAYS   omeprazole (PRILOSEC) 20 MG capsule TAKE 1 CAPSULE BY MOUTH EVERY DAY   potassium chloride SA (KLOR-CON) 20 MEQ tablet Take 20 mEq by mouth daily after breakfast.    rivaroxaban (XARELTO) 20 MG TABS tablet TAKE 1 TABLET DAILY WITH SUPPER   Tiotropium Bromide Monohydrate (SPIRIVA RESPIMAT) 2.5 MCG/ACT AERS Inhale 2 puffs into the lungs daily. 2 puff once per day   No facility-administered encounter medications on file as of 10/06/2021.   Physical Exam: Blood pressure 140/78, pulse 78, temperature 98 F (36.7 C), temperature source Oral, height 6' (1.829 m), weight 179 lb 6.4 oz (81.4 kg), SpO2 96 %. Gen:      No acute distress HEENT:  EOMI, sclera anicteric Neck:     No masses; no thyromegaly Lungs:    Clear to auscultation bilaterally; normal respiratory effort CV:         Regular rate and rhythm;  no murmurs Abd:      + bowel sounds; soft, non-tender; no palpable masses, no distension Ext:    No edema; adequate peripheral perfusion Skin:      Warm and dry; no rash Neuro: alert and oriented x 3 Psych: normal mood and affect   Data Reviewed: Imaging: CT chest 04/26/01-no lung mass, infiltrate, adenopathy. Early emphysematous changes. CT abdomen pelvis 03/03/13-lung images are clear. Chest x-ray 10/22/2019-no  active cardiopulmonary disease. Chest x-ray 12/26/2020-no acute cardiopulmonary abnormality I reviewed all images personally  PFTs  02/05/17  FVC 4.46 (89%], FEV1 3.54 (94%), F/F 79, TLC 104%, DLCO 80% Minimal obstruction and diffusion defect with significant bronchodilator response. Mild air trapping.  01/13/2020 FVC 4.51 [91%], FEV1 2.66 [9 9%], F/F 81, TLC 7.10 [95%], DLCO 27.85 [98%] No obstruction, bronchodilator response.  FENO 02/28/17- 23  ACT score  03/10/2020-16 04/13/2020-16 08/02/2020-17  Labs: CBC 10/22/2019-WBC 6.4, eos 8%, absolute eosinophil count 512 Alpha-1 antitrypsin 02/28/2017-136, PI MM  Assessment:  COPD GOLD B (CAT score 18, no exacerbations) Asthma Has COPD, asthma overlap syndrome but I suspect COPD is not significant as he has mild emphysematous changes and minimal smoking history PFTs with improvement in obstruction but he still has bronchodilator response   He continues on maximal inhaled therapy with Symbicort, Spiriva Continues on Xyzal Started on Nucala last year with some improvement in symptoms  Discussed de-escalation of inhaled steroid therapy. Reduce symbicort dose to 80/4.5. Use 1 puff twice daily  Plan/Recommendations: Spiriva, Xyzal Reduce symbicort dose Continue nucala  Follow-up in 6 months  Marshell Garfinkel MD [facility] 10/06/2021, 8:58 AM  CC: Jilda Panda, MD

## 2021-12-15 ENCOUNTER — Other Ambulatory Visit: Payer: Self-pay | Admitting: Pulmonary Disease

## 2022-02-06 ENCOUNTER — Other Ambulatory Visit: Payer: Self-pay | Admitting: Internal Medicine

## 2022-02-06 DIAGNOSIS — I480 Paroxysmal atrial fibrillation: Secondary | ICD-10-CM

## 2022-02-06 NOTE — Telephone Encounter (Signed)
Xarelto '20mg'$  refill request received. Pt is 66 years old, weight-81.4kg, Crea-1.10 on 12/29/2021 via [facility] from [facility], last seen by Tommye Standard on 08/05/2021, Diagnosis-Afib, CrCl-65m/min; Dose is appropriate based on dosing criteria. Will send in refill to requested pharmacy.

## 2022-02-20 ENCOUNTER — Other Ambulatory Visit (HOSPITAL_COMMUNITY): Payer: Self-pay

## 2022-02-20 ENCOUNTER — Telehealth: Payer: Self-pay

## 2022-02-20 NOTE — Telephone Encounter (Signed)
Received notification from [facility] regarding a prior authorization for [facility].  PA done over the phone with representative.  Authorization has been APPROVED from 01/21/2022 to 05/14/98 (indef.).   Per test claim, copay for 28 days supply is $38.00  Case ID: [id number]

## 2022-03-24 ENCOUNTER — Telehealth: Payer: Self-pay | Admitting: Pulmonary Disease

## 2022-03-24 DIAGNOSIS — J4550 Severe persistent asthma, uncomplicated: Secondary | ICD-10-CM

## 2022-03-27 NOTE — Telephone Encounter (Signed)
Refill sent for NUCALA to [facility]   Dose: 100 mg SQ every 4 weeks  Last OV: 10/06/21 Provider: Dr. Vaughan Browner  Next OV: 6 months (not scheduled and he is now due)  Knox Saliva, PharmD, MPH, BCPS Clinical Pharmacist (Rheumatology and Pulmonology)

## 2022-03-28 ENCOUNTER — Other Ambulatory Visit: Payer: Self-pay | Admitting: Student

## 2022-04-13 ENCOUNTER — Other Ambulatory Visit: Payer: Self-pay

## 2022-04-13 MED ORDER — ATORVASTATIN CALCIUM 20 MG PO TABS: 20.0000 mg | ORAL_TABLET | Freq: Every evening | ORAL | 0 refills | Status: DC

## 2022-04-18 ENCOUNTER — Ambulatory Visit (INDEPENDENT_AMBULATORY_CARE_PROVIDER_SITE_OTHER): Payer: Medicare PPO | Admitting: Pulmonary Disease

## 2022-04-18 ENCOUNTER — Encounter: Payer: Self-pay | Admitting: Pulmonary Disease

## 2022-04-18 VITALS — BP 134/78 | HR 79 | Temp 97.8°F | Ht 73.0 in | Wt 186.4 lb

## 2022-04-18 DIAGNOSIS — J4550 Severe persistent asthma, uncomplicated: Secondary | ICD-10-CM | POA: Diagnosis not present

## 2022-04-18 NOTE — Progress Notes (Signed)
Marco Morris    [id number]    [birth year]  Primary Care Physician:Moreira, Carloyn Manner, MD  Referring Physician: Jilda Panda, MD [address] [facility] [place],  [place] [address]  Chief complaint: Follow-up for COPD, asthma Started nucala December 2021  HPI: 66 y.o.  with history of atrial fibrillation, hyperlipidemia, hypothyroidism, hypertension. He has complaints of dyspnea with activity and rest for the past several years which is getting worse. He has non-productive cough with no sputum production. Denies any wheeze, fevers, chills.  He has history of atrial fibrillation status post ablation in 2015 and has been stable since then. He has history of GERD. Symptoms are well controlled on Zantac. There are no seasonal allergies, postnasal drip. He is physically active and does regular exercise with strength training, running and swimming 3 times a week In spite of the symptoms he continues to have an active lifestyle running a couple of miles every other day, swimming and exercising.  Started on Nucala in December 2021  Pets:None Occupation: Truck driver Exposures: None. No mold at home Smoking history:15-pack-year smoking history. Quit in 1979 Travel History: No recent travel except for work  Interim history: Doing well with Nucala injection therapy Continues on Symbicort, Spiriva and Xyzal.   Breathing is stable.  He hardly needs to use his rescue inhaler Continues to stay active running 5 miles and swimming several times a week  Outpatient Encounter Medications as of 04/18/2022  Medication Sig   albuterol (VENTOLIN HFA) 108 (90 Base) MCG/ACT inhaler USE 2 INHALATIONS EVERY 4 HOURS AS NEEDED FOR WHEEZING OR SHORTNESS OF BREATH   atorvastatin (LIPITOR) 20 MG tablet Take 1 tablet (20 mg total) by mouth every evening.   budesonide-formoterol (SYMBICORT) 80-4.5 MCG/ACT inhaler Inhale 1 puff into the lungs in the morning and at bedtime.   EPINEPHrine 0.3 mg/0.3 mL IJ SOAJ  injection Inject 0.3 mg into the muscle as needed for anaphylaxis.   levocetirizine (XYZAL) 5 MG tablet Take 5 mg by mouth daily after breakfast.    levothyroxine (SYNTHROID, LEVOTHROID) 125 MCG tablet Take 125 mcg by mouth daily before breakfast.   lisinopril (PRINIVIL,ZESTRIL) 10 MG tablet TAKE 1 TABLET (10 MG TOTAL) BY MOUTH DAILY.   loperamide (IMODIUM A-D) 2 MG tablet Take 1 tablet (2 mg total) by mouth every morning. (Patient taking differently: Take 2 mg by mouth every morning. Patient takes as needed.)   Mepolizumab (NUCALA) 100 MG/ML SOAJ INJECT 100 MG UNDER THE SKIN EVERY 28 DAYS   omeprazole (PRILOSEC) 20 MG capsule TAKE 1 CAPSULE BY MOUTH EVERY DAY   potassium chloride SA (KLOR-CON) 20 MEQ tablet Take 20 mEq by mouth daily after breakfast.    rivaroxaban (XARELTO) 20 MG TABS tablet TAKE 1 TABLET DAILY WITH SUPPER   Tiotropium Bromide Monohydrate (SPIRIVA RESPIMAT) 2.5 MCG/ACT AERS Inhale 2 puffs into the lungs daily. 2 puff once per day   No facility-administered encounter medications on file as of 04/18/2022.   Physical Exam: Blood pressure 134/78, pulse 79, temperature 97.8 F (36.6 C), temperature source Oral, height '6\' 1"'$  (1.854 m), weight 186 lb 6.4 oz (84.6 kg), SpO2 97 %. Gen:      No acute distress HEENT:  EOMI, sclera anicteric Neck:     No masses; no thyromegaly Lungs:    Clear to auscultation bilaterally; normal respiratory effort CV:         Regular rate and rhythm; no murmurs Abd:      + bowel sounds; soft,  non-tender; no palpable masses, no distension Ext:    No edema; adequate peripheral perfusion Skin:      Warm and dry; no rash Neuro: alert and oriented x 3 Psych: normal mood and affect   Data Reviewed: Imaging: CT chest 04/26/01-no lung mass, infiltrate, adenopathy. Early emphysematous changes. CT abdomen pelvis 03/03/13-lung images are clear. Chest x-ray 10/22/2019-no active cardiopulmonary disease. Chest x-ray 12/26/2020-no acute cardiopulmonary  abnormality I reviewed all images personally  PFTs  02/05/17  FVC 4.46 (89%], FEV1 3.54 (94%), F/F 79, TLC 104%, DLCO 80% Minimal obstruction and diffusion defect with significant bronchodilator response. Mild air trapping.  01/13/2020 FVC 4.51 [91%], FEV1 2.66 [9 9%], F/F 81, TLC 7.10 [95%], DLCO 27.85 [98%] No obstruction, bronchodilator response.  FENO 02/28/17- 23  ACT score  03/10/2020-16 04/13/2020-16 08/02/2020-17 04/18/2022-16  Labs: CBC 10/22/2019-WBC 6.4, eos 8%, absolute eosinophil count 512 Alpha-1 antitrypsin 02/28/2017-136, PI MM  Assessment:  COPD GOLD B (CAT score 18, no exacerbations) Asthma Has COPD, asthma overlap syndrome but I suspect COPD is not significant as he has mild emphysematous changes and minimal smoking history PFTs with improvement in obstruction but he still has bronchodilator response   He continues on inhaled therapy with Spiriva.  Symbicort dose has been de-escalated to 80/4.51 puff twice daily. Continues on Xyzal, Nucala  Plan/Recommendations: Continue inhalers, Nucala  Follow-up in 12 months  Marshell Garfinkel MD [facility] 04/18/2022, 9:09 AM  CC: Jilda Panda, MD

## 2022-04-18 NOTE — Patient Instructions (Signed)
I am glad you are stable with your breathing Continue your injections and inhalers as prescribed Follow-up in 1 year.

## 2022-04-19 ENCOUNTER — Other Ambulatory Visit: Payer: Self-pay | Admitting: Internal Medicine

## 2022-05-16 NOTE — Progress Notes (Signed)
Electrophysiology Office Note Date: 05/24/2022  ID:  Marco Morris, DOB 1955/07/04, MRN [id number]  PCP: Jilda Panda, MD Primary Cardiologist: None Electrophysiologist: Dr. Rayann Heman -> Melida Quitter, MD   CC: Pacemaker follow-up  Marco Morris is a 67 y.o. male seen today for Melida Quitter, MD for routine electrophysiology followup. Since last being seen in our clinic the patient reports doing very well.  he denies chest pain, palpitations, dyspnea, PND, orthopnea, nausea, vomiting, syncope, edema, weight gain, or early satiety.  He has occasional dizziness with rapid standing, occasionally at rest. BP and HRs have been stable.   Device History: Abbott leadless PPM implanted 2015, original device explanted with new leadless PPM implant on 10/23/2019   AFib Hx Diagnosed 2013 Tikosyn 2015 >> stopped post ablation PVI ablation 10/09/2013  Past Medical History:  Diagnosis Date   Blood dyscrasia    high risk for blood clot formation   Chronic kidney disease    History of renal calculi    Hypercholesterolemia    Hypertension    Hypothyroidism    Neuromuscular disorder (HCC)    numbness left foot   Pacemaker battery depletion    PAF (paroxysmal atrial fibrillation) ([place])    a.  anticoagulated with Xarelto b. s/p Tikosyn loading 07/2013 c. s/p PVI   Symptomatic bradycardia    Tachycardia-bradycardia syndrome ([place])    a. post termination pauses b. s/p STJ leadless pacemaker   Past Surgical History:  Procedure Laterality Date   ATRIAL FIBRILLATION ABLATION [name] 10/09/2013   Procedure: ATRIAL FIBRILLATION ABLATION;  Surgeon: Coralyn Mark, MD;  Location: [facility] CATH LAB;  Service: Cardiovascular;  Laterality: [name];   CHOLECYSTECTOMY  ~ 2000   LITHOTRIPSY  2004; ~ 05/2013   PACEMAKER IMPLANT [name] 10/23/2019   Procedure: PACEMAKER IMPLANT;  Surgeon: [name], MD;  Location: [facility] CV LAB;  Service: Cardiovascular;  Laterality: [name];   PERMANENT PACEMAKER INSERTION [name]  07/18/2013   STJ Leadless pacemaker implanted by Dr Rayann Heman for symptomatic pauses   PPM GENERATOR REMOVAL [name] 10/23/2019   Procedure: PPM GENERATOR REMOVAL;  Surgeon: [name], MD;  Location: [facility] CV LAB;  Service: Cardiovascular;  Laterality: [name];   TEE WITHOUT CARDIOVERSION [name] 10/08/2013   Procedure: TRANSESOPHAGEAL ECHOCARDIOGRAM (TEE);  Surgeon: Sanda Klein, MD;  Location: [facility] ENDOSCOPY;  Service: Cardiovascular;  Laterality: [name];    Current Outpatient Medications  Medication Sig Dispense Refill   albuterol (VENTOLIN HFA) 108 (90 Base) MCG/ACT inhaler USE 2 INHALATIONS EVERY 4 HOURS AS NEEDED FOR WHEEZING OR SHORTNESS OF BREATH 17 g 10   atorvastatin (LIPITOR) 20 MG tablet Take 1 tablet (20 mg total) by mouth every evening. 90 tablet 0   budesonide-formoterol (SYMBICORT) 80-4.5 MCG/ACT inhaler Inhale 1 puff into the lungs in the morning and at bedtime. 3 each 3   EPINEPHrine 0.3 mg/0.3 mL IJ SOAJ injection Inject 0.3 mg into the muscle as needed for anaphylaxis. 1 each 1   levocetirizine (XYZAL) 5 MG tablet Take 5 mg by mouth daily after breakfast.      levothyroxine (SYNTHROID, LEVOTHROID) 125 MCG tablet Take 125 mcg by mouth daily before breakfast.     lisinopril (PRINIVIL,ZESTRIL) 10 MG tablet TAKE 1 TABLET (10 MG TOTAL) BY MOUTH DAILY. 30 tablet 0   loperamide (IMODIUM A-D) 2 MG tablet Take 1 tablet (2 mg total) by mouth every morning. (Patient taking differently: Take 2 mg by mouth every morning. Patient takes as needed.) 30 tablet 0   Mepolizumab (  NUCALA) 100 MG/ML SOAJ INJECT 100 MG UNDER THE SKIN EVERY 28 DAYS 1 mL 11   omeprazole (PRILOSEC) 20 MG capsule TAKE 1 CAPSULE BY MOUTH EVERY DAY 90 capsule 0   potassium chloride SA (KLOR-CON) 20 MEQ tablet Take 20 mEq by mouth daily after breakfast.      rivaroxaban (XARELTO) 20 MG TABS tablet TAKE 1 TABLET DAILY WITH SUPPER 90 tablet 1   Tiotropium Bromide Monohydrate (SPIRIVA RESPIMAT) 2.5 MCG/ACT AERS Inhale 2 puffs into the  lungs daily. 2 puff once per day     No current facility-administered medications for this visit.    Allergies:   Codeine   Social History: Social History   Socioeconomic History   Marital status: Married    Spouse name: Not on file   Number of children: 2   Years of education: Not on file   Highest education level: Not on file  Occupational History   Occupation: DRIVER    Employer: [employer]  Tobacco Use   Smoking status: Former    Packs/day: 1.75    Years: 8.00    Total pack years: 14.00    Types: Cigarettes    Start date: 71    Quit date: 05/15/1977    Years since quitting: 45.0   Smokeless tobacco: Former    Types: Chew    Quit date: 01/13/2013  Vaping Use   Vaping Use: Never used  Substance and Sexual Activity   Alcohol use: Yes    Alcohol/week: 4.0 standard drinks of alcohol    Types: 4 Cans of beer per week   Drug use: No   Sexual activity: Not Currently  Other Topics Concern   Not on file  Social History Narrative   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   Social Determinants of Health   Financial Resource Strain: Not on file  Food Insecurity: Not on file  Transportation Needs: Not on file  Physical Activity: Not on file  Stress: Not on file  Social Connections: Not on file  Intimate Partner Violence: Not on file    Family History: Family History  Problem Relation Age of Onset   Heart attack Mother    Stroke Mother    Diabetes Mother    Cancer - Ovarian Mother    Leukemia Mother    Heart attack Father    Stroke Father    Diabetes Father    Cancer - Other Father    Colon cancer Father    Hypertension Brother      Review of Systems: All other systems reviewed and are otherwise negative except as noted above.  Physical Exam: Vitals:   05/24/22 0945  BP: 134/86  Pulse: 75  SpO2: 97%  Weight: 189 lb 6.4 oz (85.9 kg)  Height: '6\' 1"'$  (1.854 m)     GEN- The patient is well appearing, alert and oriented x 3 today.   HEENT:  normocephalic, atraumatic; sclera clear, conjunctiva pink; hearing intact; oropharynx clear; neck supple, no JVP Lymph- no cervical lymphadenopathy Lungs- Clear to ausculation bilaterally, normal work of breathing.  No wheezes, rales, rhonchi Heart- Regular  rate and rhythm, no murmurs, rubs or gallops, PMI not laterally displaced GI- soft, non-tender, non-distended, bowel sounds present, no hepatosplenomegaly Extremities- no clubbing or cyanosis. No peripheral edema; DP/PT/radial pulses 2+ bilaterally MS- no significant deformity or atrophy Skin- warm and dry, no rash or lesion; PPM pocket well healed Psych- euthymic mood, full affect Neuro- strength and sensation are intact  PPM Interrogation-  reviewed in detail today,  See PACEART report.  EKG:  EKG is ordered today. Personal review of ekg ordered today shows NSR at 72 bpm   Recent Labs: No results found for requested labs within last 365 days.   Wt Readings from Last 3 Encounters:  05/24/22 189 lb 6.4 oz (85.9 kg)  04/18/22 186 lb 6.4 oz (84.6 kg)  10/06/21 179 lb 6.4 oz (81.4 kg)     Other studies Reviewed: Additional studies/ records that were reviewed today include: Previous EP office notes, Previous remote checks, Most recent labwork.   Assessment and Plan:  1. Symptomatic bradycardia s/p St. Jude (Leadless) PPM  Normal PPM function See Pace Art report No changes today  2. PAF EKG today shows NSR. No symptoms Continue Xarelto for CHA2DS2VASc  of at least 2.  3. HTN Stable on current regimen   4. COPD Chronic, stable SOB   Current medicines are reviewed at length with the patient today.    Labs/ tests ordered today include:  Orders Placed This Encounter  Procedures   Basic metabolic panel   CBC   CUP PACEART INCLINIC DEVICE CHECK   EKG 12-Lead     Disposition:   Follow up with Dr. Myles Gip in 6 months    Signed, Shirley Friar, PA-C  05/24/2022 10:09 AM  [place] [address] [phone] (office) [phone] (fax)

## 2022-05-19 ENCOUNTER — Other Ambulatory Visit: Payer: Self-pay | Admitting: Pulmonary Disease

## 2022-05-19 DIAGNOSIS — J4550 Severe persistent asthma, uncomplicated: Secondary | ICD-10-CM

## 2022-05-23 NOTE — Telephone Encounter (Signed)
Refill sent for NUCALA to  [facility]  Dose: 100 mg SQ every 4 weeks  Last OV: 04/18/2022 Provider: Dr. Vaughan Browner  Next OV: 1 year (not scheduled)  Knox Saliva, PharmD, MPH, BCPS Clinical Pharmacist (Rheumatology and Pulmonology)

## 2022-05-24 ENCOUNTER — Ambulatory Visit: Payer: Medicare PPO | Attending: Student | Admitting: Student

## 2022-05-24 ENCOUNTER — Encounter: Payer: Self-pay | Admitting: Student

## 2022-05-24 VITALS — BP 134/86 | HR 75 | Ht 73.0 in | Wt 189.4 lb

## 2022-05-24 DIAGNOSIS — I495 Sick sinus syndrome: Secondary | ICD-10-CM

## 2022-05-24 DIAGNOSIS — I10 Essential (primary) hypertension: Secondary | ICD-10-CM | POA: Diagnosis not present

## 2022-05-24 DIAGNOSIS — I480 Paroxysmal atrial fibrillation: Secondary | ICD-10-CM | POA: Diagnosis not present

## 2022-05-24 LAB — CUP PACEART INCLINIC DEVICE CHECK
Date Time Interrogation Session: 20240110100116
Implantable Pulse Generator Implant Date: 20210610
Pulse Gen Serial Number: 1304509

## 2022-05-24 LAB — CBC

## 2022-05-24 NOTE — Patient Instructions (Addendum)
Medication Instructions:  Your physician recommends that you continue on your current medications as directed. Please refer to the Current Medication list given to you today.  *If you need a refill on your cardiac medications before your next appointment, please call your pharmacy*   Lab Work: TODAY: BMET, CBC  If you have labs (blood work) drawn today and your tests are completely normal, you will receive your results only by: [place] (if you have MyChart) OR A paper copy in the mail If you have any lab test that is abnormal or we need to change your treatment, we will call you to review the results.   Follow-Up: At [facility], you and your health needs are our priority.  As part of our continuing mission to provide you with exceptional heart care, we have created designated Provider Care Teams.  These Care Teams include your primary Cardiologist (physician) and Advanced Practice Providers (APPs -  Physician Assistants and Nurse Practitioners) who all work together to provide you with the care you need, when you need it.   Your next appointment:   6 month(s)  The format for your next appointment:   In Person  Provider:   Doralee Albino, MD     Important Information About Sugar

## 2022-05-25 LAB — CBC
Hematocrit: 42.3 % (ref 37.5–51.0)
Hemoglobin: 14.6 g/dL (ref 13.0–17.7)
MCH: 30.2 pg (ref 26.6–33.0)
MCHC: 34.5 g/dL (ref 31.5–35.7)
MCV: 87 fL (ref 79–97)
Platelets: 176 10*3/uL (ref 150–450)
RBC: 4.84 x10E6/uL (ref 4.14–5.80)
RDW: 13.4 % (ref 11.6–15.4)
WBC: 5.3 10*3/uL (ref 3.4–10.8)

## 2022-05-25 LAB — BASIC METABOLIC PANEL
BUN/Creatinine Ratio: 8 — ABNORMAL LOW (ref 10–24)
BUN: 9 mg/dL (ref 8–27)
CO2: 26 mmol/L (ref 20–29)
Calcium: 10 mg/dL (ref 8.6–10.2)
Chloride: 98 mmol/L (ref 96–106)
Creatinine, Ser: 1.09 mg/dL (ref 0.76–1.27)
Glucose: 105 mg/dL — ABNORMAL HIGH (ref 70–99)
Potassium: 4 mmol/L (ref 3.5–5.2)
Sodium: 138 mmol/L (ref 134–144)
eGFR: 74 mL/min/{1.73_m2} (ref >59)

## 2022-07-06 ENCOUNTER — Other Ambulatory Visit: Payer: Self-pay

## 2022-07-06 MED ORDER — ATORVASTATIN CALCIUM 20 MG PO TABS: 20.0000 mg | ORAL_TABLET | Freq: Every evening | ORAL | 3 refills | Status: DC

## 2022-07-20 ENCOUNTER — Other Ambulatory Visit: Payer: Self-pay | Admitting: Student

## 2022-07-21 ENCOUNTER — Other Ambulatory Visit: Payer: Self-pay | Admitting: Student

## 2022-07-21 MED ORDER — OMEPRAZOLE 20 MG PO CPDR: DELAYED_RELEASE_CAPSULE | ORAL | 0 refills | Status: DC

## 2022-07-21 NOTE — Telephone Encounter (Signed)
*  STAT* If patient is at the pharmacy, call can be transferred to refill team.   1. Which medications need to be refilled? (please list name of each medication and dose if known) omeprazole (PRILOSEC) 20 MG capsule   2. Which pharmacy/location (including street and city if local pharmacy) is medication to be sent to? [place], [place]   3. Do they need a 30 day or 90 day supply? 90 day

## 2022-07-21 NOTE — Telephone Encounter (Signed)
Pt's is requesting a refill on omeprazole. Would the provider Barrington Ellison, PA like to refill this medication? Please address

## 2022-08-04 ENCOUNTER — Other Ambulatory Visit: Payer: Self-pay | Admitting: *Deleted

## 2022-08-04 DIAGNOSIS — I480 Paroxysmal atrial fibrillation: Secondary | ICD-10-CM

## 2022-08-04 MED ORDER — RIVAROXABAN 20 MG PO TABS: 20.0000 mg | ORAL_TABLET | Freq: Every day | ORAL | 1 refills | Status: DC

## 2022-08-04 NOTE — Telephone Encounter (Signed)
Prescription refill request for Xarelto received.   Indication: afib  Last office visit: [place], 05/24/2022 Weight: 85.9 kg  Age: 67 kg  Scr: 1.09, 05/24/2022 CrCl: 79 ml/min   Refill sent.

## 2022-08-18 ENCOUNTER — Telehealth: Payer: Self-pay

## 2022-08-18 NOTE — Telephone Encounter (Signed)
Outreach made to Pt.  Advised per Abbott-Pt needs software upgrade for his leadless PPM.  Will come to office Monday 08/21/22 at 9:30 am to meet with [employer].

## 2022-08-21 ENCOUNTER — Ambulatory Visit: Payer: Medicare PPO | Attending: Cardiovascular Disease

## 2022-08-21 DIAGNOSIS — I495 Sick sinus syndrome: Secondary | ICD-10-CM

## 2022-08-21 LAB — CUP PACEART INCLINIC DEVICE CHECK
Date Time Interrogation Session: 20240408102218
Implantable Pulse Generator Implant Date: 20210610
Pulse Gen Serial Number: 1304509

## 2022-08-21 NOTE — Progress Notes (Signed)
Software upgrade.

## 2022-08-21 NOTE — Patient Instructions (Signed)
Follow up as scheduled.  

## 2022-09-26 ENCOUNTER — Other Ambulatory Visit: Payer: Self-pay

## 2022-09-26 MED ORDER — OMEPRAZOLE 20 MG PO CPDR: DELAYED_RELEASE_CAPSULE | ORAL | 2 refills | Status: DC

## 2022-10-17 ENCOUNTER — Other Ambulatory Visit: Payer: Self-pay | Admitting: Pulmonary Disease

## 2023-01-02 ENCOUNTER — Other Ambulatory Visit: Payer: Self-pay

## 2023-01-02 ENCOUNTER — Telehealth: Payer: Self-pay | Admitting: Student

## 2023-01-02 DIAGNOSIS — I480 Paroxysmal atrial fibrillation: Secondary | ICD-10-CM

## 2023-01-02 MED ORDER — RIVAROXABAN 20 MG PO TABS: 20.0000 mg | ORAL_TABLET | Freq: Every day | ORAL | 0 refills | Status: DC

## 2023-01-02 NOTE — Telephone Encounter (Signed)
*  STAT* If patient is at the pharmacy, call can be transferred to refill team.   1. Which medications need to be refilled? (please list name of each medication and dose if known) rivaroxaban (XARELTO) 20 MG TABS tablet    2. Would you like to learn more about the convenience, safety, & potential cost savings by using the [facility]? No     3. Are you open to using the [facility] (Type [facility]. No ).   4. Which pharmacy/location (including street and city if local pharmacy) is medication to be sent to? [id number] - [place], [place] - [address] AT [facility] OF [address]    5. Do they need a 30 day or 90 day supply? Patient said that his refills is coming in through [facility] - [place], [place] - [address] on 8/28 but he is currently totally out of medication right now and needs medication up until then

## 2023-01-03 NOTE — Progress Notes (Unsigned)
  Electrophysiology Office Note:   ID:  Marco Morris, DOB August 22, 1955, MRN [id number]  Primary Cardiologist: None Electrophysiologist: Maurice Small, MD  {Click to update primary MD,subspecialty MD or APP then REFRESH:1}    History of Present Illness:   Marco Morris is a 67 y.o. male with h/o symptomatic bradycardia s/p St Jude (Leadless PPM), PAF, HTN, OAC on Xarelto, and COPD seen today for routine electrophysiology followup.   Since last being seen in our clinic the patient reports doing ***.  he denies chest pain, palpitations, dyspnea, PND, orthopnea, nausea, vomiting, dizziness, syncope, edema, weight gain, or early satiety.   Review of systems complete and found to be negative unless listed in HPI.      EP Information / Studies Reviewed:    EKG is ordered today. Personal review as below.       PPM Interrogation-  reviewed in detail today,  See PACEART report.  Device History: Abbott leadless PPM implanted 2015, original device explanted with new leadless PPM implant on 10/23/2019   AFib Hx Diagnosed 2013 Tikosyn 2015 >> stopped post ablation PVI ablation 10/09/2013    Physical Exam:   VS:  There were no vitals taken for this visit.   Wt Readings from Last 3 Encounters:  05/24/22 189 lb 6.4 oz (85.9 kg)  04/18/22 186 lb 6.4 oz (84.6 kg)  10/06/21 179 lb 6.4 oz (81.4 kg)     GEN: Well nourished, well developed in no acute distress NECK: No JVD; No carotid bruits CARDIAC: {EPRHYTHM:28826}, no murmurs, rubs, gallops RESPIRATORY:  Clear to auscultation without rales, wheezing or rhonchi  ABDOMEN: Soft, non-tender, non-distended EXTREMITIES:  No edema; No deformity   ASSESSMENT AND PLAN:    Symptomatic bradycardia s/p Abbott (Leadless) PPM  Normal PPM function See Pace Art report No changes today  PAF EKG today shows *** Continue Xarelto for CHA2DS2VASc  of at least 2  HTN Stable on current regimen   COPD Chronic, stable DOE.   {Click here to  Review PMH, Prob List, Meds, Allergies, SHx, FHx  :1}   Disposition:   Follow up with Dr. Nelly Laurence in 6 months  Signed, Graciella Freer, PA-C

## 2023-01-04 ENCOUNTER — Ambulatory Visit: Payer: Medicare PPO | Attending: Student | Admitting: Student

## 2023-01-04 ENCOUNTER — Encounter: Payer: Self-pay | Admitting: Student

## 2023-01-04 VITALS — BP 126/82 | HR 99 | Ht 73.0 in | Wt 193.8 lb

## 2023-01-04 DIAGNOSIS — I495 Sick sinus syndrome: Secondary | ICD-10-CM | POA: Diagnosis not present

## 2023-01-04 DIAGNOSIS — I480 Paroxysmal atrial fibrillation: Secondary | ICD-10-CM | POA: Diagnosis not present

## 2023-01-04 DIAGNOSIS — I10 Essential (primary) hypertension: Secondary | ICD-10-CM | POA: Diagnosis not present

## 2023-01-04 LAB — CUP PACEART INCLINIC DEVICE CHECK
Date Time Interrogation Session: 20240822090304
Implantable Pulse Generator Implant Date: 20210610
Pulse Gen Serial Number: 1304509

## 2023-01-04 NOTE — Patient Instructions (Signed)
Medication Instructions:  Your physician recommends that you continue on your current medications as directed. Please refer to the Current Medication list given to you today.  *If you need a refill on your cardiac medications before your next appointment, please call your pharmacy*  Lab Work: None ordered If you have labs (blood work) drawn today and your tests are completely normal, you will receive your results only by: MyChart Message (if you have MyChart) OR A paper copy in the mail If you have any lab test that is abnormal or we need to change your treatment, we will call you to review the results.  Follow-Up: At [facility], you and your health needs are our priority.  As part of our continuing mission to provide you with exceptional heart care, we have created designated Provider Care Teams.  These Care Teams include your primary Cardiologist (physician) and Advanced Practice Providers (APPs -  Physician Assistants and Nurse Practitioners) who all work together to provide you with the care you need, when you need it.  Your next appointment:   6 month(s)  Provider:   Augustus Mealor, MD  

## 2023-03-14 ENCOUNTER — Other Ambulatory Visit: Payer: Self-pay | Admitting: Student

## 2023-03-14 DIAGNOSIS — I480 Paroxysmal atrial fibrillation: Secondary | ICD-10-CM

## 2023-03-14 NOTE — Telephone Encounter (Signed)
Prescription refill request for Xarelto received.  Indication: PAF Last office visit: 01/04/23  Polly Cobia PA-C Weight: 87.9kg Age: 67 Scr: 1.09 on 05/24/22  Epic CrCl: 79.52  Based on above findings Xarelto 20mg  daily is the appropriate dose.  Refill approved.

## 2023-03-23 ENCOUNTER — Telehealth: Payer: Self-pay | Admitting: Pulmonary Disease

## 2023-03-23 DIAGNOSIS — J4550 Severe persistent asthma, uncomplicated: Secondary | ICD-10-CM

## 2023-03-28 NOTE — Telephone Encounter (Signed)
Refill sent for NUCALA to [facility]: [phone]  Dose: 100mg  SQ every 4 weeks  Last OV: 04/18/2022 Provider: Dr. Isaiah Serge Next OV: overdue  Routing to scheduling team for follow-up on appt scheduling  Chesley Mires, PharmD, MPH, BCPS Clinical Pharmacist (Rheumatology and Pulmonology)

## 2023-03-28 NOTE — Telephone Encounter (Signed)
Patient is scheduled 12/2 with Dr. Isaiah Serge.

## 2023-04-16 ENCOUNTER — Ambulatory Visit (INDEPENDENT_AMBULATORY_CARE_PROVIDER_SITE_OTHER): Payer: Medicare PPO | Admitting: Pulmonary Disease

## 2023-04-16 ENCOUNTER — Encounter: Payer: Self-pay | Admitting: Pulmonary Disease

## 2023-04-16 VITALS — BP 140/80 | HR 71 | Ht 73.0 in | Wt 191.4 lb

## 2023-04-16 DIAGNOSIS — J4550 Severe persistent asthma, uncomplicated: Secondary | ICD-10-CM

## 2023-04-16 NOTE — Patient Instructions (Signed)
VISIT SUMMARY:  During today's visit, we discussed your asthma management and the new onset of loose stools that you have been experiencing. We reviewed your recent colonoscopy results and considered the possibility of Nucala being the cause of your symptoms. We also talked about general health maintenance, including the RSV vaccine.  YOUR PLAN:  -ASTHMA: Asthma is a condition where your airways narrow and swell, producing extra mucus, which can make breathing difficult. Your asthma is well controlled with Nucala, but we will hold your next dose to see if it is related to your diarrhea. Please monitor your asthma symptoms closely during this period.  -DIARRHEA: Diarrhea is characterized by loose, watery stools. You have been experiencing this with black dots, but your recent colonoscopy was normal. We will observe if there are any changes after holding your Nucala dose. Please return for a follow-up in 3-4 months or sooner if your symptoms worsen.  -GENERAL HEALTH MAINTENANCE: We discussed the importance of receiving the RSV vaccine to protect against respiratory syncytial virus. It is recommended that you get this vaccine at your local pharmacy.  INSTRUCTIONS:  Hold your Nucala dose due on 05/12/2023 and monitor your asthma symptoms closely. Observe for any changes in your diarrhea. Return for a follow-up appointment in 3-4 months or sooner if your symptoms worsen. Additionally, please receive the RSV vaccine at your local pharmacy.

## 2023-04-16 NOTE — Progress Notes (Signed)
Marco Morris    [id number]    04-10-1956  Primary Care Physician:Moreira, Channing Mutters, MD  Referring Physician: Ralene Ok, MD [address] [facility] [place],  [place] [address]  Chief complaint: Follow-up for COPD, asthma Started nucala December 2021  HPI: 67 y.o.  with history of atrial fibrillation, hyperlipidemia, hypothyroidism, hypertension. He has complaints of dyspnea with activity and rest for the past several years which is getting worse. He has non-productive cough with no sputum production. Denies any wheeze, fevers, chills.  He has history of atrial fibrillation status post ablation in 2015 and has been stable since then. He has history of GERD. Symptoms are well controlled on Zantac. There are no seasonal allergies, postnasal drip. He is physically active and does regular exercise with strength training, running and swimming 3 times a week In spite of the symptoms he continues to have an active lifestyle running a couple of miles every other day, swimming and exercising.  Started on Nucala in December 2021  Pets:None Occupation: Truck driver Exposures: None. No mold at home Smoking history:15-pack-year smoking history. Quit in 1979 Travel History: No recent travel except for work  Interim history: Discussed the use of AI scribe software for clinical note transcription with the patient, who gave verbal consent to proceed.  The patient, with a history of asthma managed with Nucala injections, presents with a new onset of loose stools. The loose stools, which occur after the Nucala injections, have been ongoing for a few months. The patient describes the stools as loose with black dots that look like rocks. He has consulted with his GI doctor, who performed a colonoscopy and ran tests, but found no abnormalities. The patient has been on Nucala for four years without any previous issues. The loose stools are sometimes severe, with the patient describing a rumbling stomach and  an explosive release once he reaches the toilet. The patient also mentions occasional shortness of breath, occurring once or twice a day.       Outpatient Encounter Medications as of 04/16/2023  Medication Sig   albuterol (VENTOLIN HFA) 108 (90 Base) MCG/ACT inhaler USE 2 INHALATIONS EVERY 4 HOURS AS NEEDED FOR WHEEZING OR SHORTNESS OF BREATH   atorvastatin (LIPITOR) 20 MG tablet Take 1 tablet (20 mg total) by mouth every evening.   budesonide-formoterol (SYMBICORT) 80-4.5 MCG/ACT inhaler USE 1 INHALATION IN THE MORNING AND AT BEDTIME   EPINEPHrine 0.3 mg/0.3 mL IJ SOAJ injection Inject 0.3 mg into the muscle as needed for anaphylaxis.   levocetirizine (XYZAL) 5 MG tablet Take 5 mg by mouth daily after breakfast.    levothyroxine (SYNTHROID, LEVOTHROID) 125 MCG tablet Take 125 mcg by mouth daily before breakfast.   lisinopril (PRINIVIL,ZESTRIL) 10 MG tablet TAKE 1 TABLET (10 MG TOTAL) BY MOUTH DAILY.   loperamide (IMODIUM A-D) 2 MG tablet Take 1 tablet (2 mg total) by mouth every morning. (Patient taking differently: Take 2 mg by mouth every morning. Patient takes as needed.)   Mepolizumab (NUCALA) 100 MG/ML SOAJ Inject 1 mL (100 mg total) into the skin every 28 (twenty-eight) days. **APPOINTMENT NEEDED FOR FURTHER REFILLS   omeprazole (PRILOSEC) 20 MG capsule TAKE 1 CAPSULE BY MOUTH EVERY DAY.   potassium chloride SA (KLOR-CON) 20 MEQ tablet Take 20 mEq by mouth daily after breakfast.    rivaroxaban (XARELTO) 20 MG TABS tablet TAKE 1 TABLET DAILY WITH SUPPER   Tiotropium Bromide Monohydrate (SPIRIVA RESPIMAT) 2.5 MCG/ACT AERS Inhale 2 puffs into the  lungs daily. 2 puff once per day   No facility-administered encounter medications on file as of 04/16/2023.   Physical Exam: Blood pressure (!) 140/80, pulse 71, height 6\' 1"  (1.854 m), weight 191 lb 6.4 oz (86.8 kg), SpO2 98%. Gen:      No acute distress HEENT:  EOMI, sclera anicteric Neck:     No masses; no thyromegaly Lungs:    Clear to  auscultation bilaterally; normal respiratory effort CV:         Regular rate and rhythm; no murmurs Abd:      + bowel sounds; soft, non-tender; no palpable masses, no distension Ext:    No edema; adequate peripheral perfusion Skin:      Warm and dry; no rash Neuro: alert and oriented x 3 Psych: normal mood and affect   Data Reviewed: Imaging: CT chest 04/26/01-no lung mass, infiltrate, adenopathy. Early emphysematous changes. CT abdomen pelvis 03/03/13-lung images are clear. Chest x-ray 10/22/2019-no active cardiopulmonary disease. Chest x-ray 12/26/2020-no acute cardiopulmonary abnormality I reviewed all images personally  PFTs  02/05/17  FVC 4.46 (89%], FEV1 3.54 (94%), F/F 79, TLC 104%, DLCO 80% Minimal obstruction and diffusion defect with significant bronchodilator response. Mild air trapping.  01/13/2020 FVC 4.51 [91%], FEV1 2.66 [9 9%], F/F 81, TLC 7.10 [95%], DLCO 27.85 [98%] No obstruction, bronchodilator response.  FENO 02/28/17- 23  ACT score  03/10/2020-16 04/13/2020-16 08/02/2020-17 04/18/2022-16  Labs: CBC 10/22/2019-WBC 6.4, eos 8%, absolute eosinophil count 512 Alpha-1 antitrypsin 02/28/2017-136, PI MM  Assessment:  COPD GOLD B (CAT score 18, no exacerbations) Asthma Has COPD, asthma overlap syndrome but I suspect COPD is not significant as he has mild emphysematous changes and minimal smoking history PFTs with improvement in obstruction but he still has bronchodilator response  Well controlled on Nucala. New onset diarrhea with unclear etiology. Discussed the possibility of Nucala as the cause, but unlikely given the duration of use without this side effect. -Hold Nucala for one dose (due 05/12/2023) to assess if diarrhea resolves. -Continue to monitor asthma symptoms closely during this period. - He continues on inhaled therapy with Spiriva.  Symbicort dose has been de-escalated to 80/4.51 puff twice daily. - Continue Xyzal  Diarrhea New onset, loose stools  with black dots. Recent colonoscopy with Dr. Christella Hartigan was unremarkable. Unclear etiology. -Observe for changes after holding Nucala. -Return for follow-up in 3-4 months or sooner if symptoms worsen.  General Health Maintenance Discussed RSV vaccine. -Recommended to receive RSV vaccine at local pharmacy.     Plan/Recommendations: Continue inhalers, Nucala (hold for 1 dose)   Chilton Greathouse MD [facility] 04/16/2023, 8:34 AM  CC: Ralene Ok, MD

## 2023-04-20 ENCOUNTER — Other Ambulatory Visit: Payer: Self-pay | Admitting: Pulmonary Disease

## 2023-04-20 DIAGNOSIS — J4550 Severe persistent asthma, uncomplicated: Secondary | ICD-10-CM

## 2023-04-20 NOTE — Telephone Encounter (Signed)
It was stated on the RX that the patient will need an office visit to have any future refills. The patient was last seen on 04/16/2023. Do you want to approve or deny refill?

## 2023-05-29 ENCOUNTER — Other Ambulatory Visit: Payer: Self-pay

## 2023-05-29 ENCOUNTER — Telehealth: Payer: Self-pay | Admitting: Home Health

## 2023-05-29 ENCOUNTER — Emergency Department (HOSPITAL_BASED_OUTPATIENT_CLINIC_OR_DEPARTMENT_OTHER)
Admission: EM | Admit: 2023-05-29 | Discharge: 2023-05-29 | Disposition: A | Discharge status: Home / Self Care | Payer: Medicare PPO | Attending: Emergency Medicine | Admitting: Emergency Medicine

## 2023-05-29 ENCOUNTER — Emergency Department (HOSPITAL_BASED_OUTPATIENT_CLINIC_OR_DEPARTMENT_OTHER): Payer: Medicare PPO | Admitting: Radiology

## 2023-05-29 ENCOUNTER — Encounter (HOSPITAL_BASED_OUTPATIENT_CLINIC_OR_DEPARTMENT_OTHER): Payer: Self-pay | Admitting: Emergency Medicine

## 2023-05-29 DIAGNOSIS — Z79899 Other long term (current) drug therapy: Secondary | ICD-10-CM | POA: Insufficient documentation

## 2023-05-29 DIAGNOSIS — R0602 Shortness of breath: Secondary | ICD-10-CM | POA: Diagnosis present

## 2023-05-29 DIAGNOSIS — J449 Chronic obstructive pulmonary disease, unspecified: Secondary | ICD-10-CM | POA: Diagnosis not present

## 2023-05-29 DIAGNOSIS — J069 Acute upper respiratory infection, unspecified: Secondary | ICD-10-CM | POA: Insufficient documentation

## 2023-05-29 DIAGNOSIS — J45909 Unspecified asthma, uncomplicated: Secondary | ICD-10-CM | POA: Diagnosis not present

## 2023-05-29 DIAGNOSIS — I10 Essential (primary) hypertension: Secondary | ICD-10-CM | POA: Insufficient documentation

## 2023-05-29 DIAGNOSIS — Z7901 Long term (current) use of anticoagulants: Secondary | ICD-10-CM | POA: Diagnosis not present

## 2023-05-29 DIAGNOSIS — Z20822 Contact with and (suspected) exposure to covid-19: Secondary | ICD-10-CM | POA: Diagnosis not present

## 2023-05-29 LAB — URINALYSIS, ROUTINE W REFLEX MICROSCOPIC
Bilirubin Urine: NEGATIVE
Glucose, UA: NEGATIVE mg/dL
Hgb urine dipstick: NEGATIVE
Ketones, ur: NEGATIVE mg/dL
Leukocytes,Ua: NEGATIVE
Nitrite: NEGATIVE
Specific Gravity, Urine: 1.016 (ref 1.005–1.030)
pH: 7.5 (ref 5.0–8.0)

## 2023-05-29 LAB — CBC
HCT: 41.5 % (ref 39.0–52.0)
Hemoglobin: 14.6 g/dL (ref 13.0–17.0)
MCH: 30 pg (ref 26.0–34.0)
MCHC: 35.2 g/dL (ref 30.0–36.0)
MCV: 85.2 fL (ref 80.0–100.0)
Platelets: 154 10*3/uL (ref 150–400)
RBC: 4.87 MIL/uL (ref 4.22–5.81)
RDW: 13.7 % (ref 11.5–15.5)
WBC: 6.8 10*3/uL (ref 4.0–10.5)
nRBC: 0 % (ref 0.0–0.2)

## 2023-05-29 LAB — BASIC METABOLIC PANEL
Anion gap: 8 (ref 5–15)
BUN: 10 mg/dL (ref 8–23)
CO2: 26 mmol/L (ref 22–32)
Calcium: 9.7 mg/dL (ref 8.9–10.3)
Chloride: 101 mmol/L (ref 98–111)
Creatinine, Ser: 1.14 mg/dL (ref 0.61–1.24)
GFR, Estimated: >60 mL/min (ref >60)
Glucose, Bld: 188 mg/dL — ABNORMAL HIGH (ref 70–99)
Potassium: 3.9 mmol/L (ref 3.5–5.1)
Sodium: 135 mmol/L (ref 135–145)

## 2023-05-29 LAB — TROPONIN I (HIGH SENSITIVITY)
Troponin I (High Sensitivity): 4 ng/L (ref <18)
Troponin I (High Sensitivity): 5 ng/L (ref <18)

## 2023-05-29 LAB — RESP PANEL BY RT-PCR (RSV, FLU A&B, COVID)  RVPGX2
Influenza A by PCR: NEGATIVE
Influenza B by PCR: NEGATIVE
Resp Syncytial Virus by PCR: NEGATIVE
SARS Coronavirus 2 by RT PCR: NEGATIVE

## 2023-05-29 NOTE — ED Provider Notes (Signed)
 [place] EMERGENCY DEPARTMENT AT [facility] Provider Note   CSN: [id number] Arrival date & time: 05/29/23  1845     History  Chief Complaint  Patient presents with   Shortness of Breath    Marco Morris is a 68 y.o. male.  68 year old male with history of COPD/asthma, atrial fibrillation on Xarelto , hypertension, and hyperlipidemia who presents to the emergency department with URI type symptoms.  Patient reports that over the past week he has had a cold.  Multiple other sick members at home.  Has had a cough with clear sputum as well as some shortness of breath.  Has been getting distantly occasionally.  Blood pressure was elevated at home to 151/94 and had a heart rate of 102 so decided to come into the emergency department for evaluation.       Home Medications Prior to Admission medications   Medication Sig Start Date End Date Taking? Authorizing Provider  albuterol  (VENTOLIN  HFA) 108 (90 Base) MCG/ACT inhaler USE 2 INHALATIONS EVERY 4 HOURS AS NEEDED FOR WHEEZING OR SHORTNESS OF BREATH 12/15/21   Mannam, Praveen, MD  atorvastatin  (LIPITOR) 20 MG tablet Take 1 tablet (20 mg total) by mouth every evening. 07/06/22   Lesia Ozell Barter, PA-C  budesonide -formoterol  (SYMBICORT ) 80-4.5 MCG/ACT inhaler USE 1 INHALATION IN THE MORNING AND AT BEDTIME 10/18/22   Mannam, Praveen, MD  EPINEPHrine  0.3 mg/0.3 mL IJ SOAJ injection Inject 0.3 mg into the muscle as needed for anaphylaxis. 04/09/20   Mannam, Praveen, MD  levocetirizine (XYZAL ) 5 MG tablet Take 5 mg by mouth daily after breakfast.     [provider]  levothyroxine  (SYNTHROID , LEVOTHROID) 125 MCG tablet Take 125 mcg by mouth daily before breakfast.    [provider]  lisinopril  (PRINIVIL ,ZESTRIL ) 10 MG tablet TAKE 1 TABLET (10 MG TOTAL) BY MOUTH DAILY. 03/06/14   Jordan, Peter M, MD  loperamide  (IMODIUM  A-D) 2 MG tablet Take 1 tablet (2 mg total) by mouth every morning. Patient taking differently:  Take 2 mg by mouth every morning. Patient takes as needed. 06/29/21   Teressa Toribio SQUIBB, MD  NUCALA  100 MG/ML SOAJ INJECT 1 ML (100 MG TOTAL) UNDER THE SKIN EVERY 28 DAYS (APPOINTMENT NEEDED FOR FURTHER REFILLS) 04/20/23   Mannam, Praveen, MD  omeprazole  (PRILOSEC) 20 MG capsule TAKE 1 CAPSULE BY MOUTH EVERY DAY. 09/26/22   Lesia Ozell Barter, PA-C  potassium chloride  SA (KLOR-CON ) 20 MEQ tablet Take 20 mEq by mouth daily after breakfast.     [provider]  rivaroxaban  (XARELTO ) 20 MG TABS tablet TAKE 1 TABLET DAILY WITH SUPPER 03/14/23   Mealor, Augustus E, MD  Tiotropium Bromide  Monohydrate (SPIRIVA  RESPIMAT) 2.5 MCG/ACT AERS Inhale 2 puffs into the lungs daily. 2 puff once per day    [provider]      Allergies    Codeine    Review of Systems   Review of Systems  Physical Exam Updated Vital Signs BP (!) 143/94   Pulse 69   Temp 98.6 F (37 C)   Resp 19   Ht 6' 1 (1.854 m)   Wt 86.2 kg   SpO2 99%   BMI 25.07 kg/m  Physical Exam Vitals and nursing note reviewed.  Constitutional:      General: He is not in acute distress.    Appearance: He is well-developed.  HENT:     Head: Normocephalic and atraumatic.     Right Ear: External ear normal.     Left Ear:  External ear normal.     Nose: Nose normal.  Eyes:     Extraocular Movements: Extraocular movements intact.     Conjunctiva/sclera: Conjunctivae normal.     Pupils: Pupils are equal, round, and reactive to light.  Cardiovascular:     Rate and Rhythm: Normal rate and regular rhythm.     Heart sounds: Normal heart sounds.  Pulmonary:     Effort: Pulmonary effort is normal. No respiratory distress.     Breath sounds: Normal breath sounds.  Musculoskeletal:     Cervical back: Normal range of motion and neck supple.     Right lower leg: No edema.     Left lower leg: No edema.  Skin:    General: Skin is warm and dry.  Neurological:     Mental Status: He is alert. Mental status is at baseline.   Psychiatric:        Mood and Affect: Mood normal.        Behavior: Behavior normal.     ED Results / Procedures / Treatments   Labs (all labs ordered are listed, but only abnormal results are displayed) Labs Reviewed  BASIC METABOLIC PANEL - Abnormal; Notable for the following components:      Result Value   Glucose, Bld 188 (*)    All other components within normal limits  URINALYSIS, ROUTINE W REFLEX MICROSCOPIC - Abnormal; Notable for the following components:   Protein, ur TRACE (*)    All other components within normal limits  RESP PANEL BY RT-PCR (RSV, FLU A&B, COVID)  RVPGX2  CBC  TROPONIN I (HIGH SENSITIVITY)  TROPONIN I (HIGH SENSITIVITY)    EKG EKG Interpretation Date/Time:  Tuesday May 29 2023 18:57:55 EST Ventricular Rate:  99 PR Interval:  142 QRS Duration:  84 QT Interval:  332 QTC Calculation: 426 R Axis:   -33  Text Interpretation: Normal sinus rhythm Left axis deviation Abnormal ECG When compared with ECG of 26-Dec-2020 21:34, No significant change was found Confirmed by Yolande Charleston [phone]) on 05/29/2023 7:24:28 PM  Radiology DG Chest 2 View Result Date: 05/29/2023 CLINICAL DATA:  Chest pain short of breath EXAM: CHEST - 2 VIEW COMPARISON:  12/26/2020 FINDINGS: The heart size and mediastinal contours are within normal limits. Both lungs are clear. The visualized skeletal structures are unremarkable. Lead less pacemaker IMPRESSION: No active cardiopulmonary disease. Electronically Signed   By: Luke Bun M.D.   On: 05/29/2023 20:10    Procedures Procedures    Medications Ordered in ED Medications - No data to display  ED Course/ Medical Decision Making/ A&P                                 Medical Decision Making Amount and/or Complexity of Data Reviewed Labs: ordered. Radiology: ordered.   Marco Morris is a 68 y.o. male with comorbidities that complicate the patient evaluation including COPD/asthma, atrial fibrillation on  Xarelto , hypertension, and hyperlipidemia who presents to the emergency department with URI type symptoms.   Initial Ddx:  URI, pneumonia, COPD/asthma, PE, anemia  MDM/Course:  Patient presents emergency department URI symptoms.  Multiple other sick family numbers at home.  Has been having a cough and some shortness of breath.  On exam is not in acute distress.  Lung sounds are clear to auscultation bilaterally making COPD or asthma less likely.  Considered PE but is already anticoagulated with Eliquis so feel this is  highly unlikely.  Chest x-ray without pneumonia.  COVID and flu negative.  Upon re-evaluation patient was stable.  Suspect that he has a URI causing his symptoms.  Will him follow-up with his primary doctor in a week to ensure that he is improving as expected.  This patient presents to the ED for concern of complaints listed in HPI, this involves an extensive number of treatment options, and is a complaint that carries with it a high risk of complications and morbidity. Disposition including potential need for admission considered.   Dispo: DC Home. Return precautions discussed including, but not limited to, those listed in the AVS. Allowed pt time to ask questions which were answered fully prior to dc.  Additional history obtained from spouse Records reviewed Outpatient Clinic Notes The following labs were independently interpreted: Chemistry and show no acute abnormality I independently reviewed the following imaging with scope of interpretation limited to determining acute life threatening conditions related to emergency care: Chest x-ray and agree with the radiologist interpretation with the following exceptions: none I personally reviewed and interpreted cardiac monitoring: normal sinus rhythm  I personally reviewed and interpreted the pt's EKG: see above for interpretation  I have reviewed the patients home medications and made adjustments as needed Social Determinants of  health:  Elderly  Portions of this note were generated with Scientist, clinical (histocompatibility and immunogenetics). Dictation errors may occur despite best attempts at proofreading.     Final Clinical Impression(s) / ED Diagnoses Final diagnoses:  Upper respiratory tract infection, unspecified type    Rx / DC Orders ED Discharge Orders     None         Yolande Lamar BROCKS, MD 05/30/23 1335

## 2023-05-29 NOTE — Telephone Encounter (Signed)
 Patient's wife called, reports patient has been feeling poor since the past weekend, SOB, coughing, dizzy, BP 151/94, HR 102. Symptoms worsening. Advised the patient go to the nearest ER for in person evaluation. Wife agreed.

## 2023-05-29 NOTE — Discharge Instructions (Signed)
 You were seen for your upper respiratory tract infection in the emergency department.   Follow-up with your primary doctor in 2-3 days regarding your visit.    Return immediately to the emergency department if you experience any of the following: Difficulty breathing, or any other concerning symptoms.    Thank you for visiting our Emergency Department. It was a pleasure taking care of you today.

## 2023-05-29 NOTE — ED Triage Notes (Signed)
 C/o SHOB, HTN, dizziness, and left sided flank pain. Hx of afib. States he has had multiple medicine changes recently.

## 2023-06-06 ENCOUNTER — Other Ambulatory Visit: Payer: Self-pay | Admitting: Student

## 2023-06-22 ENCOUNTER — Other Ambulatory Visit: Payer: Self-pay | Admitting: Student

## 2023-07-05 ENCOUNTER — Ambulatory Visit: Payer: Medicare PPO | Admitting: Student

## 2023-07-06 ENCOUNTER — Encounter: Payer: Self-pay | Admitting: Cardiovascular Disease

## 2023-07-06 ENCOUNTER — Ambulatory Visit: Payer: Medicare PPO | Attending: Cardiovascular Disease | Admitting: Cardiovascular Disease

## 2023-07-06 VITALS — BP 146/86 | HR 80 | Ht 73.0 in | Wt 188.0 lb

## 2023-07-06 DIAGNOSIS — Z950 Presence of cardiac pacemaker: Secondary | ICD-10-CM | POA: Diagnosis not present

## 2023-07-06 DIAGNOSIS — I480 Paroxysmal atrial fibrillation: Secondary | ICD-10-CM | POA: Diagnosis not present

## 2023-07-06 DIAGNOSIS — I495 Sick sinus syndrome: Secondary | ICD-10-CM

## 2023-07-06 NOTE — Progress Notes (Signed)
  Electrophysiology Office Note:    Date:  07/06/2023   ID:  Marco Morris, DOB March 07, 1956, MRN [id number]  PCP:  Ralene Ok, MD   [facility] Providers Cardiologist:  None Electrophysiologist:  Maurice Small, MD     Referring MD: Ralene Ok, MD   History of Present Illness:    Marco Morris is a 68 y.o. 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     He has a leadless pacemaker implanted in 2015.  This device was explanted and a new [employer] of air leadless pacemaker placed on October 23, 2019.  He has a history of atrial fibrillation diagnosed in 2013.  He was placed on Tikosyn initially but underwent ablation in May 2015.  Tikosyn was stopped post ablation.       Today, he reports that he is doing well.  He has not had any symptoms of recurrence of atrial fibrillation.  He has occasional episodes of dizziness but no presyncope or syncope.  He typically works out 3 times a week at [employer] [place].  His son and 2 grandchildren have been living with him and his wife for the past several months.    EKGs/Labs/Other Studies Reviewed Today:     Echocardiogram:  TTE September 2022 EF 55 to 60%.     EKG:   EKG Interpretation Date/Time:  Friday July 06 2023 09:54:17 EST Ventricular Rate:  80 PR Interval:  148 QRS Duration:  92 QT Interval:  366 QTC Calculation: 422 R Axis:   -11  Text Interpretation: Normal sinus rhythm Normal ECG When compared with ECG of 29-May-2023 18:57, No significant change was found Confirmed by York Pellant [phone]) on 07/06/2023 10:15:28 AM     Physical Exam:    VS:  BP (!) 146/86 (BP Location: Right Arm, Patient Position: Sitting, Cuff Size: Large)   Pulse 80   Ht 6\' 1"  (1.854 m)   Wt 188 lb (85.3 kg)   SpO2 99%   BMI 24.80 kg/m     Wt Readings from Last 3  Encounters:  07/06/23 188 lb (85.3 kg)  05/29/23 190 lb (86.2 kg)  04/16/23 191 lb 6.4 oz (86.8 kg)     GEN: Well nourished, well developed in no acute distress CARDIAC: RRR, no murmurs, rubs, gallops RESPIRATORY:  Normal work of breathing MUSCULOSKELETAL: no edema    ASSESSMENT & PLAN:     Abbott leadless pacemaker I reviewed today's device interrogation in detail.  See Paceart for report Normal device function. He is not device dependent Battery life expectancy 17 years.  Paroxysmal atrial fibrillation No symptoms to suggest recurrence Continue rivaroxaban 20 mg daily     Signed, Maurice Small, MD  07/06/2023 10:36 AM    [facility]

## 2023-07-06 NOTE — Patient Instructions (Signed)
Medication Instructions:  Your physician recommends that you continue on your current medications as directed. Please refer to the Current Medication list given to you today. *If you need a refill on your cardiac medications before your next appointment, please call your pharmacy*   Follow-Up: At [facility], you and your health needs are our priority.  As part of our continuing mission to provide you with exceptional heart care, we have created designated Provider Care Teams.  These Care Teams include your primary Cardiologist (physician) and Advanced Practice Providers (APPs -  Physician Assistants and Nurse Practitioners) who all work together to provide you with the care you need, when you need it.  We recommend signing up for the patient portal called "MyChart".  Sign up information is provided on this After Visit Summary.  MyChart is used to connect with patients for Virtual Visits (Telemedicine).  Patients are able to view lab/test results, encounter notes, upcoming appointments, etc.  Non-urgent messages can be sent to your provider as well.   To learn more about what you can do with MyChart, go to [email or web address].    Your next appointment:   6 month(s)  Provider:   You will see one of the following Advanced Practice Providers on your designated Care Team:   Francis Dowse, Charlott Holler [address] [place], [place] Sherie Don, NP Canary Brim, NP

## 2023-07-18 ENCOUNTER — Ambulatory Visit: Payer: Medicare PPO | Admitting: Pulmonary Disease

## 2023-07-18 ENCOUNTER — Encounter: Payer: Self-pay | Admitting: Pulmonary Disease

## 2023-07-18 VITALS — BP 134/68 | HR 79 | Ht 73.0 in | Wt 193.4 lb

## 2023-07-18 DIAGNOSIS — J4550 Severe persistent asthma, uncomplicated: Secondary | ICD-10-CM

## 2023-07-18 DIAGNOSIS — R197 Diarrhea, unspecified: Secondary | ICD-10-CM | POA: Diagnosis not present

## 2023-07-18 DIAGNOSIS — J449 Chronic obstructive pulmonary disease, unspecified: Secondary | ICD-10-CM

## 2023-07-18 NOTE — Progress Notes (Signed)
 Marco Morris    [id number]    30-Mar-1956  Primary Care Physician:Moreira, Channing Mutters, MD  Referring Physician: Ralene Ok, MD [address] [facility] [place],  [place] [address]  Chief complaint: Follow-up for COPD, asthma Started nucala December 2021  HPI: 68 y.o.  with history of atrial fibrillation, hyperlipidemia, hypothyroidism, hypertension. He has complaints of dyspnea with activity and rest for the past several years which is getting worse. He has non-productive cough with no sputum production. Denies any wheeze, fevers, chills.  He has history of atrial fibrillation status post ablation in 2015 and has been stable since then. He has history of GERD. Symptoms are well controlled on Zantac. There are no seasonal allergies, postnasal drip. He is physically active and does regular exercise with strength training, running and swimming 3 times a week In spite of the symptoms he continues to have an active lifestyle running a couple of miles every other day, swimming and exercising.  Started on Nucala in December 2021  Pets:None Occupation: Truck driver Exposures: None. No mold at home Smoking history:15-pack-year smoking history. Quit in 1979 Travel History: No recent travel except for work  Interim history: Discussed the use of AI scribe software for clinical note transcription with the patient, who gave verbal consent to proceed.  Marco Morris is a 68 year old male with COPD and severe persistent asthma who presents for a routine follow-up.  He has COPD and severe persistent asthma, managed with Nucala since 2021. His respiratory symptoms are well-controlled with no changes since the last visit. He continues to use Nucala as part of his treatment regimen.  He experiences chronic diarrhea, which he suspects may be related to Nucala. Despite a 30-day cessation of the medication, the diarrhea persisted without significant improvement. The diarrhea is characterized by loose  stools with 'black dots' resembling 'asphalt' and occurs almost immediately after meals. He has undergone a gastrointestinal evaluation, including a colonoscopy and stool tests, which did not reveal any abnormalities. His previous GI specialist, Dr. Christella Hartigan, is no longer practicing, and he has not yet consulted another GI specialist.   Outpatient Encounter Medications as of 07/18/2023  Medication Sig   albuterol (VENTOLIN HFA) 108 (90 Base) MCG/ACT inhaler USE 2 INHALATIONS EVERY 4 HOURS AS NEEDED FOR WHEEZING OR SHORTNESS OF BREATH   atorvastatin (LIPITOR) 20 MG tablet Take 1 tablet (20 mg total) by mouth every evening.   budesonide-formoterol (SYMBICORT) 80-4.5 MCG/ACT inhaler USE 1 INHALATION IN THE MORNING AND AT BEDTIME   EPINEPHrine 0.3 mg/0.3 mL IJ SOAJ injection Inject 0.3 mg into the muscle as needed for anaphylaxis.   levocetirizine (XYZAL) 5 MG tablet Take 5 mg by mouth daily after breakfast.    levothyroxine (SYNTHROID, LEVOTHROID) 125 MCG tablet Take 125 mcg by mouth daily before breakfast.   lisinopril (PRINIVIL,ZESTRIL) 10 MG tablet TAKE 1 TABLET (10 MG TOTAL) BY MOUTH DAILY.   loperamide (IMODIUM A-D) 2 MG tablet Take 1 tablet (2 mg total) by mouth every morning. (Patient taking differently: Take 2 mg by mouth every morning. Patient takes as needed.)   NUCALA 100 MG/ML SOAJ INJECT 1 ML (100 MG TOTAL) UNDER THE SKIN EVERY 28 DAYS (APPOINTMENT NEEDED FOR FURTHER REFILLS)   omeprazole (PRILOSEC) 20 MG capsule TAKE 1 CAPSULE DAILY   potassium chloride SA (KLOR-CON) 20 MEQ tablet Take 20 mEq by mouth daily after breakfast.    rivaroxaban (XARELTO) 20 MG TABS tablet TAKE 1 TABLET DAILY WITH SUPPER  Tiotropium Bromide Monohydrate (SPIRIVA RESPIMAT) 2.5 MCG/ACT AERS Inhale 2 puffs into the lungs daily. 2 puff once per day   No facility-administered encounter medications on file as of 07/18/2023.   Physical Exam: Blood pressure 134/68, pulse 79, height 6\' 1"  (1.854 m), weight 193 lb 6.4 oz  (87.7 kg), SpO2 96%. Gen:      No acute distress HEENT:  EOMI, sclera anicteric Neck:     No masses; no thyromegaly Lungs:    Clear to auscultation bilaterally; normal respiratory effort CV:         Regular rate and rhythm; no murmurs Abd:      + bowel sounds; soft, non-tender; no palpable masses, no distension Ext:    No edema; adequate peripheral perfusion Skin:      Warm and dry; no rash Neuro: alert and oriented x 3 Psych: normal mood and affect   Data Reviewed: Imaging: CT chest 04/26/01-no lung mass, infiltrate, adenopathy. Early emphysematous changes. CT abdomen pelvis 03/03/13-lung images are clear. Chest x-ray 10/22/2019-no active cardiopulmonary disease. Chest x-ray 12/26/2020-no acute cardiopulmonary abnormality Chest x-ray 05/29/2023-no acute cardiopulmonary disease I reviewed all images personally  PFTs  02/05/17  FVC 4.46 (89%], FEV1 3.54 (94%), F/F 79, TLC 104%, DLCO 80% Minimal obstruction and diffusion defect with significant bronchodilator response. Mild air trapping.  01/13/2020 FVC 4.51 [91%], FEV1 2.66 [9 9%], F/F 81, TLC 7.10 [95%], DLCO 27.85 [98%] No obstruction, bronchodilator response.  FENO 02/28/17- 23  ACT score  03/10/2020-16 04/13/2020-16 08/02/2020-17 04/18/2022-16  Labs: CBC 10/22/2019-WBC 6.4, eos 8%, absolute eosinophil count 512 Alpha-1 antitrypsin 02/28/2017-136, PI MM  Assessment:  Chronic Obstructive Pulmonary Disease (COPD) and Severe Persistent Asthma Has COPD, asthma overlap syndrome but I suspect COPD is not significant as he has mild emphysematous changes and minimal smoking history On Nucala since 2021, effectively controlling respiratory symptoms. Diarrhea suspected as a side effect of Nucala; a 30-day hold did not improve symptoms. Resumed Nucala due to its efficacy in asthma management. Diarrhea persists, but asthma remains well-controlled. Discussed the importance of maintaining asthma control despite the side effect.  - Continue  Nucala - Follow-up in six months  Chronic Diarrhea Persistent diarrhea with loose stools containing black dots, ongoing despite a 30-day hold on Nucala. Previous gastrointestinal evaluations, including colonoscopy and stool tests, were unremarkable. Experiences postprandial gut pain and urgency. Suspects diarrhea may not be related to Nucala due to long-term use without prior issues. Will consult gastroenterologist for further evaluation. - Consult gastroenterologist for further evaluation.     Plan/Recommendations: Continue inhalers, Murtis Sink MD [facility] 07/18/2023, 8:42 AM  CC: Ralene Ok, MD

## 2023-07-18 NOTE — Patient Instructions (Signed)
 VISIT SUMMARY:  Tai, you had a follow-up appointment today to review your COPD and severe persistent asthma, as well as to discuss your ongoing chronic diarrhea. Your respiratory symptoms remain well-controlled with Nucala, but you continue to experience chronic diarrhea, which has not improved despite a temporary cessation of the medication. We discussed the importance of maintaining your asthma control and the need for further evaluation of your gastrointestinal symptoms.  YOUR PLAN:  -CHRONIC OBSTRUCTIVE PULMONARY DISEASE (COPD) AND SEVERE PERSISTENT ASTHMA: COPD and severe persistent asthma are chronic lung conditions that cause breathing difficulties. Your symptoms are well-controlled with Nucala, which you have been using since 2021. Despite experiencing chronic diarrhea, it is important to continue Nucala to keep your asthma under control. We will follow up in six months to monitor your condition.  -CHRONIC DIARRHEA: Chronic diarrhea is the frequent passage of loose or watery stools. You have been experiencing this symptom with black dots in your stools, which has persisted even after stopping Nucala for 30 days. Previous tests did not show any abnormalities. We recommend consulting a gastroenterologist for further evaluation to determine the cause and appropriate treatment.  INSTRUCTIONS:  Please schedule an appointment with a gastroenterologist for further evaluation of your chronic diarrhea. We will follow up in six months to monitor your COPD and asthma.  For more information, you can read your full clinical note, available in your patient portal.

## 2023-08-20 ENCOUNTER — Telehealth: Payer: Self-pay

## 2023-08-20 ENCOUNTER — Telehealth: Payer: Self-pay | Admitting: *Deleted

## 2023-08-20 NOTE — Telephone Encounter (Signed)
   Pre-operative Risk Assessment    Patient Name: Marco Morris  DOB: 1955-10-16 MRN: [id number]   Date of last office visit: 07/06/23 DR. MEALOR Date of next office visit: NONE   Request for Surgical Clearance    Procedure:   ENDOSCOPY  Date of Surgery:  Clearance 09/05/23                                Surgeon:  DR. [facility] Surgeon's Group or Practice Name:  [facility] Phone number:  [phone] Fax number:  [phone]   Type of Clearance Requested:   - Medical  - Pharmacy:  Hold Rivaroxaban (Xarelto) x 2 DAYS PRIOR   Type of Anesthesia:   PROPOFOL   Additional requests/questions:    Elpidio Anis   08/20/2023, 5:06 PM

## 2023-08-20 NOTE — Telephone Encounter (Signed)
 Copied from CRM [id number]. Topic: Clinical - Medical Advice >> Aug 20, 2023  9:15 AM Isabell A wrote: Reason for CRM: Patient states he was told by his PCP Dr.Moreira, to call his pulmonary office to discuss sleep study treatment.   Callback number: [phone]  Called and spoke with pt regarding his concerns. I have received fax referral & sleep study results from PCP office. Routing to Dr. Isaiah Serge as he is pts established provider. Pt has ov with Tammy in September. Please advise if you would like patient to be scheduled sooner with sleep provider or NP.

## 2023-08-21 ENCOUNTER — Encounter: Payer: Self-pay | Admitting: Cardiovascular Disease

## 2023-08-21 NOTE — Telephone Encounter (Unsigned)
 Patient with diagnosis of afib on Xarelto for anticoagulation.    Procedure: ENDOSCOPY  Date of procedure: 09/05/23   CHA2DS2-VASc Score = 2   This indicates a 2.2% annual risk of stroke. The patient's score is based upon: CHF History: 0 HTN History: 1 Diabetes History: 0 Stroke History: 0 Vascular Disease History: 0 Age Score: 1 Gender Score: 0      CrCl 74 ml/min Platelet count 154  Per office protocol, patient can hold Xarelto for 2 days prior to procedure.    **This guidance is not considered finalized until pre-operative APP has relayed final recommendations.**

## 2023-08-21 NOTE — Telephone Encounter (Signed)
 Called and spoke with Marco Morris. TP and PM advised me to go ahead and schedule pt with TP for next avail slot. Pt scheduled for 5/30 for sleep consult with TP. Sleep study documents & referral has been placed in TPs cabinet Pt verbalized understanding. NFN

## 2023-08-21 NOTE — Progress Notes (Signed)
 PERIOPERATIVE PRESCRIPTION FOR IMPLANTED CARDIAC DEVICE PROGRAMMING   Patient Information:  Patient: Marco Morris  MRN: [id number]  Date of Birth: 04-02-1956    Procedure:   ENDOSCOPY Date of Surgery:  Clearance 09/05/23                             Surgeon:  DR. [facility] Surgeon's Group or Practice Name:  [facility] Phone number:  [phone] Fax number:  [phone]   Device Information:   Clinic EP Physician:   Dr. York Pellant Device Type:  Pacemaker Manufacturer and Phone #:  [employer]ott: [phone] Pacemaker Dependent?:  Unknown Date of Last Device Check:  01/04/23        Normal Device Function?:  Yes     Electrophysiologist's Recommendations:   Have magnet available. Provide continuous ECG monitoring when magnet is used or reprogramming is to be performed.  Procedure will likely interfere with device function.  Device should be programmed:  Asynchronous pacing during procedure and returned to normal programming after procedure  Per Device Clinic Standing Orders, Lenor Coffin  08/21/2023 9:47 AM

## 2023-08-21 NOTE — Telephone Encounter (Signed)
 Yes. If we can get him in sooner it would be good. Tammy Parrett can see severe sleep apnea patients too

## 2023-08-22 ENCOUNTER — Telehealth: Payer: Self-pay | Admitting: *Deleted

## 2023-08-22 NOTE — Telephone Encounter (Signed)
   Name: Marco Morris  DOB: [birth year]  MRN: [id number]  Primary Cardiologist: None   Preoperative team, please contact this patient and set up a phone call appointment for further preoperative risk assessment. Please obtain consent and complete medication review. Thank you for your help.  I confirm that guidance regarding antiplatelet and oral anticoagulation therapy has been completed and, if necessary, noted below.  Per office protocol, patient can hold Xarelto for 2 days prior to procedure.   I also confirmed the patient resides in the state of [place]. As per [facility] Medical Board telemedicine laws, the patient must reside in the state in which the provider is licensed.   Ronney Asters, NP 08/22/2023, 11:23 AM [facility]

## 2023-08-22 NOTE — Telephone Encounter (Signed)
 Patient scheduled 08/23/23

## 2023-08-22 NOTE — Telephone Encounter (Signed)
  Patient Consent for Virtual Visit       Marco Morris has provided verbal consent on 08/22/2023 for a virtual visit (video or telephone).   CONSENT FOR VIRTUAL VISIT FOR:  Marco Morris  By participating in this virtual visit I agree to the following:  I hereby voluntarily request, consent and authorize [facility] and its employed or contracted physicians, physician assistants, nurse practitioners or other licensed health care professionals (the Practitioner), to provide me with telemedicine health care services (the "Services") as deemed necessary by the treating Practitioner. I acknowledge and consent to receive the Services by the Practitioner via telemedicine. I understand that the telemedicine visit will involve communicating with the Practitioner through live audiovisual communication technology and the disclosure of certain medical information by electronic transmission. I acknowledge that I have been given the opportunity to request an in-person assessment or other available alternative prior to the telemedicine visit and am voluntarily participating in the telemedicine visit.  I understand that I have the right to withhold or withdraw my consent to the use of telemedicine in the course of my care at any time, without affecting my right to future care or treatment, and that the Practitioner or I may terminate the telemedicine visit at any time. I understand that I have the right to inspect all information obtained and/or recorded in the course of the telemedicine visit and may receive copies of available information for a reasonable fee.  I understand that some of the potential risks of receiving the Services via telemedicine include:  Delay or interruption in medical evaluation due to technological equipment failure or disruption; Information transmitted may not be sufficient (e.g. poor resolution of images) to allow for appropriate medical decision making by the Practitioner;  and/or  In rare instances, security protocols could fail, causing a breach of personal health information.  Furthermore, I acknowledge that it is my responsibility to provide information about my medical history, conditions and care that is complete and accurate to the best of my ability. I acknowledge that Practitioner's advice, recommendations, and/or decision may be based on factors not within their control, such as incomplete or inaccurate data provided by me or distortions of diagnostic images or specimens that may result from electronic transmissions. I understand that the practice of medicine is not an exact science and that Practitioner makes Morris warranties or guarantees regarding treatment outcomes. I acknowledge that a copy of this consent can be made available to me via my patient portal [facility] MyChart), or I can request a printed copy by calling the office of [facility].    I understand that my insurance will be billed for this visit.   I have read or had this consent read to me. I understand the contents of this consent, which adequately explains the benefits and risks of the Services being provided via telemedicine.  I have been provided ample opportunity to ask questions regarding this consent and the Services and have had my questions answered to my satisfaction. I give my informed consent for the services to be provided through the use of telemedicine in my medical care

## 2023-08-23 ENCOUNTER — Ambulatory Visit: Attending: Cardiovascular Disease

## 2023-08-23 DIAGNOSIS — Z01810 Encounter for preprocedural cardiovascular examination: Secondary | ICD-10-CM

## 2023-08-23 NOTE — Progress Notes (Signed)
 Virtual Visit via Telephone Note   Because of [name] co-morbid illnesses, he is at least at moderate risk for complications without adequate follow up.  This format is felt to be most appropriate for this patient at this time.  Due to technical limitations with video connection (technology), today's appointment will be conducted as an audio only telehealth visit, and [name] verbally agreed to proceed in this manner.   All issues noted in this document were discussed and addressed.  No physical exam could be performed with this format.  Evaluation Performed:  Preoperative cardiovascular risk assessment _____________   Date:  08/23/2023   Patient ID:  Marco Morris, DOB 20-Jul-1955, MRN [id number] Patient Location:  Home Provider location:   Office  Primary Care Provider:  Ralene Ok, MD Primary Cardiologist:  None  Chief Complaint / Patient Profile   68 y.o. y/o male with a h/o HTN, HLD, AFib, tachy-brady w/ Abbott leadless PPM, COPD  who is pending endoscopy and presents today for telephonic preoperative cardiovascular risk assessment.  History of Present Illness    Marco Morris is a 68 y.o. male who presents via audio/video conferencing for a telehealth visit today.  Pt was last seen in cardiology clinic on 07/06/2023 by Dr. Nelly Laurence.  At that time Marco Morris was doing well with no recurrence of AF and working out 3 times a week at the [facility] downtown.  He was seen prior in the ED on 05/29/2023 for complaint of shortness of breath and malaise and treated for upper respiratory infection.  He was seen by pulmonologist on 07/18/2023 with advisement to hold Nucala due to diarrhea. The patient is now pending procedure as outlined above. Since his last visit, he has been doing well with no new cardiac complaints.  He denies chest pain, shortness of breath, lower extremity edema, fatigue, palpitations, melena, hematuria, hemoptysis, diaphoresis, weakness, presyncope,  syncope, orthopnea, and PND.     Past Medical History    Past Medical History:  Diagnosis Date   Blood dyscrasia    high risk for blood clot formation   Chronic kidney disease    History of renal calculi    Hypercholesterolemia    Hypertension    Hypothyroidism    Neuromuscular disorder (HCC)    numbness left foot   Pacemaker battery depletion    PAF (paroxysmal atrial fibrillation) (HCC)    a.  anticoagulated with Xarelto b. s/p Tikosyn loading 07/2013 c. s/p PVI   Symptomatic bradycardia    Tachycardia-bradycardia syndrome (HCC)    a. post termination pauses b. s/p STJ leadless pacemaker   Past Surgical History:  Procedure Laterality Date   ATRIAL FIBRILLATION ABLATION [name] 10/09/2013   Procedure: ATRIAL FIBRILLATION ABLATION;  Surgeon: Gardiner Rhyme, MD;  Location: [facility] CATH LAB;  Service: Cardiovascular;  Laterality: [name];   CHOLECYSTECTOMY  ~ 2000   LITHOTRIPSY  2004; ~ 05/2013   PACEMAKER IMPLANT [name] 10/23/2019   Procedure: PACEMAKER IMPLANT;  Surgeon: Hillis Range, MD;  Location: [facility] INVASIVE CV LAB;  Service: Cardiovascular;  Laterality: [name];   PERMANENT PACEMAKER INSERTION [name] 07/18/2013   STJ Leadless pacemaker implanted by Dr Johney Frame for symptomatic pauses   PPM GENERATOR REMOVAL [name] 10/23/2019   Procedure: PPM GENERATOR REMOVAL;  Surgeon: Hillis Range, MD;  Location: [facility] INVASIVE CV LAB;  Service: Cardiovascular;  Laterality: [name];   TEE WITHOUT CARDIOVERSION [name] 10/08/2013   Procedure: TRANSESOPHAGEAL ECHOCARDIOGRAM (TEE);  Surgeon: Thurmon Fair, MD;  Location: [facility] ENDOSCOPY;  Service: Cardiovascular;  Laterality: [name];    Allergies  Allergies  Allergen Reactions   Codeine Nausea And Vomiting    Home Medications    Prior to Admission medications   Medication Sig Start Date End Date Taking? Authorizing Provider  albuterol (VENTOLIN HFA) 108 (90 Base) MCG/ACT inhaler USE 2 INHALATIONS EVERY 4 HOURS AS NEEDED FOR WHEEZING OR SHORTNESS OF BREATH 12/15/21   Mannam,  Praveen, MD  atorvastatin (LIPITOR) 20 MG tablet Take 1 tablet (20 mg total) by mouth every evening. 06/22/23   Graciella Freer, PA-C  budesonide-formoterol (SYMBICORT) 80-4.5 MCG/ACT inhaler USE 1 INHALATION IN THE MORNING AND AT BEDTIME 10/18/22   Mannam, Praveen, MD  EPINEPHrine 0.3 mg/0.3 mL IJ SOAJ injection Inject 0.3 mg into the muscle as needed for anaphylaxis. 04/09/20   Mannam, Colbert Coyer, MD  levocetirizine (XYZAL) 5 MG tablet Take 5 mg by mouth daily after breakfast.     [provider]  levothyroxine (SYNTHROID, LEVOTHROID) 125 MCG tablet Take 125 mcg by mouth daily before breakfast.    [provider]  lisinopril (PRINIVIL,ZESTRIL) 10 MG tablet TAKE 1 TABLET (10 MG TOTAL) BY MOUTH DAILY. 03/06/14   [place], Peter M, MD  loperamide (IMODIUM A-D) 2 MG tablet Take 1 tablet (2 mg total) by mouth every morning. Patient taking differently: Take 2 mg by mouth every morning. Patient takes as needed. 06/29/21   Rachael Fee, MD  NUCALA 100 MG/ML SOAJ INJECT 1 ML (100 MG TOTAL) UNDER THE SKIN EVERY 28 DAYS (APPOINTMENT NEEDED FOR FURTHER REFILLS) 04/20/23   Chilton Greathouse, MD  omeprazole (PRILOSEC) 20 MG capsule TAKE 1 CAPSULE DAILY 06/07/23   Graciella Freer, PA-C  potassium chloride SA (KLOR-CON) 20 MEQ tablet Take 20 mEq by mouth daily after breakfast.     [provider]  rivaroxaban (XARELTO) 20 MG TABS tablet TAKE 1 TABLET DAILY WITH SUPPER 03/14/23   Mealor, Roberts Gaudy, MD  Tiotropium Bromide Monohydrate (SPIRIVA RESPIMAT) 2.5 MCG/ACT AERS Inhale 2 puffs into the lungs daily. 2 puff once per day    [provider]    Physical Exam    Vital Signs:  PASQUAL FARIAS does not have vital signs available for review today.  134/68  Given telephonic nature of communication, physical exam is limited. AAOx3. NAD. Normal affect.  Speech and respirations are unlabored.  Accessory Clinical Findings    None  Assessment & Plan    1.   Preoperative Cardiovascular Risk Assessment:  -Patient's RCRI score is 0.4%  The patient affirms he has been doing well without any new cardiac symptoms. They are able to achieve 8 METS without cardiac limitations. Therefore, based on ACC/AHA guidelines, the patient would be at acceptable risk for the planned procedure without further cardiovascular testing. The patient was advised that if he develops new symptoms prior to surgery to contact our office to arrange for a follow-up visit, and he verbalized understanding.   The patient was advised that if he develops new symptoms prior to surgery to contact our office to arrange for a follow-up visit, and he verbalized understanding.  Patient can hold Xarelto 2 days prior to procedure and should restart postprocedure when surgically safe and hemostasis is achieved.  A copy of this note will be routed to requesting surgeon.  Time:   Today, I have spent 8 minutes with the patient with telehealth technology discussing medical history, symptoms, and management plan.     Napoleon Form, Leodis Rains, NP  08/23/2023, 7:26 AM

## 2023-10-10 ENCOUNTER — Telehealth: Payer: Self-pay | Admitting: Cardiovascular Disease

## 2023-10-10 DIAGNOSIS — I480 Paroxysmal atrial fibrillation: Secondary | ICD-10-CM

## 2023-10-10 MED ORDER — RIVAROXABAN 20 MG PO TABS: 20.0000 mg | ORAL_TABLET | Freq: Every day | ORAL | 0 refills | Status: DC

## 2023-10-10 MED ORDER — RIVAROXABAN 20 MG PO TABS: 20.0000 mg | ORAL_TABLET | Freq: Every day | ORAL | 1 refills | Status: DC

## 2023-10-10 NOTE — Telephone Encounter (Signed)
*  STAT* If patient is at the pharmacy, call can be transferred to refill team.   1. Which medications need to be refilled? (please list name of each medication and dose if known)   rivaroxaban  (XARELTO ) 20 MG TABS tablet     4. Which pharmacy/location (including street and city if local pharmacy) is medication to be sent to?  [facility] - [place], [place] - [address]     5. Do they need a 30 day or 90 day supply? 90     *STAT* If patient is at the pharmacy, call can be transferred to refill team.   1. Which medications need to be refilled? (please list name of each medication and dose if known) rivaroxaban  (XARELTO ) 20 MG TABS tablet   4. Which pharmacy/location (including street and city if local pharmacy) is medication to be sent to? [id number] Jonette Nestle, [place] - [address] AT [facility] [address] Phone: [phone]  Fax: [phone]       5. Do they need a 30 day or 90 day supply? 30

## 2023-10-10 NOTE — Telephone Encounter (Signed)
 Pt last saw Dr Arlester Ladd 07/06/23, last labs 05/29/23 Creat 1.14, age 68, weight 87.7kg, CrCl 74.79, based on CrCl pt is on appropriate dosage of Xarelto  20mg  every day for afib.  Will refill rx.

## 2023-10-12 ENCOUNTER — Telehealth: Payer: Self-pay

## 2023-10-12 ENCOUNTER — Ambulatory Visit: Admitting: Adult Health

## 2023-10-12 ENCOUNTER — Encounter: Payer: Self-pay | Admitting: Adult Health

## 2023-10-12 VITALS — BP 138/88 | HR 91 | Ht 73.0 in | Wt 194.4 lb

## 2023-10-12 DIAGNOSIS — J45909 Unspecified asthma, uncomplicated: Secondary | ICD-10-CM | POA: Insufficient documentation

## 2023-10-12 DIAGNOSIS — Z87891 Personal history of nicotine dependence: Secondary | ICD-10-CM | POA: Diagnosis not present

## 2023-10-12 DIAGNOSIS — J4550 Severe persistent asthma, uncomplicated: Secondary | ICD-10-CM

## 2023-10-12 DIAGNOSIS — G4733 Obstructive sleep apnea (adult) (pediatric): Secondary | ICD-10-CM | POA: Diagnosis not present

## 2023-10-12 DIAGNOSIS — Z9981 Dependence on supplemental oxygen: Secondary | ICD-10-CM

## 2023-10-12 NOTE — Telephone Encounter (Signed)
 Called and spoke with PCP to obtain sleep study. Waiting for sleep study to be faxed.

## 2023-10-12 NOTE — Progress Notes (Addendum)
 @Patient  ID: Marco Morris, male    DOB: 1955-12-30, 68 y.o.   MRN: [id number]  Chief Complaint  Patient presents with   Consult    Referring provider: Valma Carwin, MD  HPI: 68 year old male former smoker followed for severe persistent asthma Referred for sleep consult Oct 12, 2023 for sleep apnea Patient is a truck driver  TEST/EVENTS :  Started on Nucala  December 2021  10/12/2023 Sleep consult  Patient presents for sleep consult today.  Kindly referred by primary care provider, Valma Carwin, MD .  Patient is followed by Dr. Theophilus for severe persistent asthma.  Says overall asthma is doing well.  He remains on Symbicort , Spiriva  and Nucala .  Denies any flare of cough or wheezing.  No increased albuterol  use.  Patient has an extensive cardiac history with A-fib, symptomatic bradycardia status post pacemaker.  Patient reports loud snoring, witnessed apneic events and daytime sleepiness.  He was set up for a home sleep study through his primary care provider.  This showed severe sleep apnea.  Home sleep study done on July 28, 2023 showed severe sleep apnea with AHI 54.1/hour and SpO2 low at 75%.  Patient was referred to our office for management.  Patient typically goes to bed about 10 AM.  Goes to sleep very quickly.  Gets up about 4 PM.  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  He does not take any sleep aids.  He does have removable dental work.  He has no history of stroke or congestive heart failure.  Epworth score is 7 out of 24.  Gets sleepy after eating lunch.  And sitting down to rest or watch TV.  Patient typically does take a nap once daily.  No symptoms suspicious for cataplexy or sleep paralysis.  Social history patient is married.  Has adult children.  Exercises several days a week including running, swimming and biking.  He works full-time 2nd and 3rd shift.  He is a Naval architect.  He is a former smoker.  Social alcohol weekends  only.  Family history positive for asthma and emphysema.  Past Surgical History:  Procedure Laterality Date   ATRIAL FIBRILLATION ABLATION [name] 10/09/2013   Procedure: ATRIAL FIBRILLATION ABLATION;  Surgeon: Lynwood JONETTA Rakers, MD;  Location: [facility] CATH [facility];  Service: Cardiovascular;  Laterality: [name];   CHOLECYSTECTOMY  ~ 2000   LITHOTRIPSY  2004; ~ 05/2013   PACEMAKER IMPLANT [name] 10/23/2019   Procedure: PACEMAKER IMPLANT;  Surgeon: Rakers Lynwood, MD;  Location: [facility] INVASIVE CV LAB;  Service: Cardiovascular;  Laterality: [name];   PERMANENT PACEMAKER INSERTION [name] 07/18/2013   STJ Leadless pacemaker implanted by Dr Rakers for symptomatic pauses   PPM GENERATOR REMOVAL [name] 10/23/2019   Procedure: PPM GENERATOR REMOVAL;  Surgeon: Rakers Lynwood, MD;  [facility]: [facility]VASIVE CV [facility];  Service: Cardiovascular;  Laterality: [name];   TEE WITHOUT CARDIOVERSION [name] 10/08/2013   Procedure: TRANSESOPHAGEAL ECHOCARDIOGRAM (TEE);  Surgeon: Jerel Balding, MD;  Location: [facility] ENDOSCOPY;  Service: Cardiovascular;  Laterality: [name];      Allergies  Allergen Reactions   Codeine Nausea And Vomiting    Immunization History  Administered Date(s) Administered   Fluad Quad(high Dose 65+) 02/12/2019, 01/02/2022   Influenza, High Dose Seasonal PF 03/16/2023   Influenza-Unspecified 02/21/2017, 01/14/2020   PFIZER(Purple Top)SARS-COV-2 Vaccination 06/19/2019, 07/10/2019    Past Medical History:  Diagnosis Date   Blood dyscrasia    high risk for blood clot formation  Chronic kidney disease    History of renal calculi    Hypercholesterolemia    Hypertension    Hypothyroidism    Neuromuscular disorder (HCC)    numbness left foot   Pacemaker battery depletion    PAF (paroxysmal atrial fibrillation) (HCC)    a.  anticoagulated with Xarelto  b. s/p Tikosyn  loading 07/2013 c. s/p PVI   Symptomatic bradycardia    Tachycardia-bradycardia syndrome (HCC)    a. post termination pauses b. s/p STJ leadless pacemaker    Tobacco  History: Social History   Tobacco Use  Smoking Status Former   Current packs/day: 0.00   Average packs/day: 1.8 packs/day for 8.0 years (14.0 ttl pk-yrs)   Types: Cigarettes   Start date: 42   Quit date: 05/15/1977   Years since quitting: 46.4  Smokeless Tobacco Former   Types: Chew   Quit date: 01/13/2013   Counseling given: Not Answered   Outpatient Medications Prior to Visit  Medication Sig Dispense Refill   albuterol  (VENTOLIN  HFA) 108 (90 Base) MCG/ACT inhaler USE 2 INHALATIONS EVERY 4 HOURS AS NEEDED FOR WHEEZING OR SHORTNESS OF BREATH 17 g 10   atorvastatin  (LIPITOR) 20 MG tablet Take 1 tablet (20 mg total) by mouth every evening. 90 tablet 2   budesonide -formoterol  (SYMBICORT ) 80-4.5 MCG/ACT inhaler USE 1 INHALATION IN THE MORNING AND AT BEDTIME 30.6 g 5   EPINEPHrine  0.3 mg/0.3 mL IJ SOAJ injection Inject 0.3 mg into the muscle as needed for anaphylaxis. 1 each 1   levocetirizine (XYZAL ) 5 MG tablet Take 5 mg by mouth daily after breakfast.      levothyroxine  (SYNTHROID , LEVOTHROID) 125 MCG tablet Take 125 mcg by mouth daily before breakfast.     lisinopril  (PRINIVIL ,ZESTRIL ) 10 MG tablet TAKE 1 TABLET (10 MG TOTAL) BY MOUTH DAILY. 30 tablet 0   loperamide  (IMODIUM  A-D) 2 MG tablet Take 1 tablet (2 mg total) by mouth every morning. (Patient taking differently: Take 2 mg by mouth every morning. Patient takes as needed.) 30 tablet 0   NUCALA  100 MG/ML SOAJ INJECT 1 ML (100 MG TOTAL) UNDER THE SKIN EVERY 28 DAYS (APPOINTMENT NEEDED FOR FURTHER REFILLS) 3 mL 11   omeprazole  (PRILOSEC) 20 MG capsule TAKE 1 CAPSULE DAILY 90 capsule 2   potassium chloride  SA (KLOR-CON ) 20 MEQ tablet Take 20 mEq by mouth daily after breakfast.      rivaroxaban  (XARELTO ) 20 MG TABS tablet Take 1 tablet (20 mg total) by mouth daily with supper. 30 tablet 0   Tiotropium Bromide  Monohydrate (SPIRIVA  RESPIMAT) 2.5 MCG/ACT AERS Inhale 2 puffs into the lungs daily. 2 puff once per day     No  facility-administered medications prior to visit.     Review of Systems:   Constitutional:   No  weight loss, night sweats,  Fevers, chills,+ fatigue, or  lassitude.  HEENT:   No headaches,  Difficulty swallowing,  Tooth/dental problems, or  Sore throat,                No sneezing, itching, ear ache, nasal congestion, post nasal drip,   CV:  No chest pain,  Orthopnea, PND, swelling in lower extremities, anasarca, dizziness, palpitations, syncope.   GI  No heartburn, indigestion, abdominal pain, nausea, vomiting, diarrhea, change in bowel habits, loss of appetite, bloody stools.   Resp: No shortness of breath with exertion or at rest.  No excess mucus, no productive cough,  No non-productive cough,  No coughing up of blood.  No change in  color of mucus.  No wheezing.  No chest wall deformity  Skin: no rash or lesions.  GU: no dysuria, change in color of urine, no urgency or frequency.  No flank pain, no hematuria   MS:  No joint pain or swelling.  No decreased range of motion.  No back pain.    Physical Exam  BP 138/88 (BP Location: Left Arm, Cuff Size: Normal)   Pulse 91   Ht 6' 1 (1.854 m)   Wt 194 lb 6.4 oz (88.2 kg)   SpO2 96%   BMI 25.65 kg/m   GEN: A/Ox3; pleasant , NAD, well nourished    HEENT:  [place]/AT,  NOSE-clear, THROAT-clear, no lesions, no postnasal drip or exudate noted. Class 3 MP airway   NECK:  Supple w/ fair ROM; no JVD; normal carotid impulses w/o bruits; no thyromegaly or nodules palpated; no lymphadenopathy.    RESP  Clear  P & A; w/o, wheezes/ rales/ or rhonchi. no accessory muscle use, no dullness to percussion  CARD:  RRR, no m/r/g, no peripheral edema, pulses intact, no cyanosis or clubbing.  GI:   Soft & nt; nml bowel sounds; no organomegaly or masses detected.   Musco: Warm bil, no deformities or joint swelling noted.   Neuro: alert, no focal deficits noted.    Skin: Warm, no lesions or rashes    Lab Results:     BNP No results  found for: BNP    Imaging: No results found.  Administration History     None          Latest Ref Rng & Units 01/13/2020    8:53 AM 02/05/2017    8:57 AM  PFT Results  FVC-Pre L 4.02  3.85   FVC-Predicted Pre % 81  77   FVC-Post L 4.51  4.46   FVC-Predicted Post % 91  89   Pre FEV1/FVC % % 79  78   Post FEV1/FCV % % 81  79   FEV1-Pre L 3.18  2.99   FEV1-Predicted Pre % 87  79   FEV1-Post L 3.66  3.54   DLCO uncorrected ml/min/mmHg 27.85  28.16   DLCO UNC% % 98  80   DLCO corrected ml/min/mmHg 27.85  28.01   DLCO COR %Predicted % 98  79   DLVA Predicted % 103  84   TLC L 7.10  7.77   TLC % Predicted % 95  104   RV % Predicted % 111  132     Lab Results  Component Value Date   NITRICOXIDE 17 06/18/2017        Assessment & Plan:   OSA (obstructive sleep apnea) Severe OSA -patient education on sleep apnea. Discussed treatment options .  - discussed how weight can impact sleep and risk for sleep disordered breathing - discussed options to assist with weight loss: combination of diet modification, cardiovascular and strength training exercises   - had an extensive discussion regarding the adverse health consequences related to untreated sleep disordered breathing - specifically discussed the risks for hypertension, coronary artery disease, cardiac dysrhythmias, cerebrovascular disease, and diabetes - lifestyle modification discussed   - discussed how sleep disruption can increase risk of accidents, particularly when driving - safe driving practices were discussed   Proceed with Auto CPAP 5-15cmH2o.   CPAP care discussed   Plan  Patient Instructions  Begin CPAP At bedtime, wear all night for at least 6hr each night  Do not drive if sleepy  Work on  healthy weight  Continue on Symbicort , Spiriva  and Nucala  .  Albuterol  inhaler As needed   Follow up in 3 months and As needed      Asthma Severe persistent asthma -well controll   Plan  Patient  Instructions  Begin CPAP At bedtime, wear all night for at least 6hr each night  Do not drive if sleepy  Work on healthy weight  Continue on Symbicort , Spiriva  and Nucala  .  Albuterol  inhaler As needed   Follow up in 3 months and As needed        Madelin Stank, NP 10/12/2023

## 2023-10-12 NOTE — Assessment & Plan Note (Signed)
 Severe OSA -patient education on sleep apnea. Discussed treatment options .  - discussed how weight can impact sleep and risk for sleep disordered breathing - discussed options to assist with weight loss: combination of diet modification, cardiovascular and strength training exercises   - had an extensive discussion regarding the adverse health consequences related to untreated sleep disordered breathing - specifically discussed the risks for hypertension, coronary artery disease, cardiac dysrhythmias, cerebrovascular disease, and diabetes - lifestyle modification discussed   - discussed how sleep disruption can increase risk of accidents, particularly when driving - safe driving practices were discussed   Proceed with Auto CPAP 5-15cmH2o.   CPAP care discussed   Plan  Patient Instructions  Begin CPAP At bedtime, wear all night for at least 6hr each night  Do not drive if sleepy  Work on healthy weight  Continue on Symbicort , Spiriva  and Nucala  .  Albuterol  inhaler As needed   Follow up in 3 months and As needed

## 2023-10-12 NOTE — Telephone Encounter (Signed)
 Fax has been received

## 2023-10-12 NOTE — Assessment & Plan Note (Signed)
 Severe persistent asthma -well controll   Plan  Patient Instructions  Begin CPAP At bedtime, wear all night for at least 6hr each night  Do not drive if sleepy  Work on healthy weight  Continue on Symbicort , Spiriva  and Nucala  .  Albuterol  inhaler As needed   Follow up in 3 months and As needed

## 2023-10-12 NOTE — Patient Instructions (Signed)
 Begin CPAP At bedtime, wear all night for at least 6hr each night  Do not drive if sleepy  Work on healthy weight  Continue on Symbicort , Spiriva  and Nucala  .  Albuterol  inhaler As needed   Follow up in 3 months and As needed

## 2023-10-16 ENCOUNTER — Encounter: Payer: Self-pay | Admitting: Internal Medicine

## 2023-10-16 NOTE — Telephone Encounter (Signed)
 Received and uploaded in patients chart

## 2023-10-22 ENCOUNTER — Telehealth: Payer: Self-pay

## 2023-10-22 NOTE — Telephone Encounter (Signed)
 Copied from CRM [id number]. Topic: Clinical - Order For Equipment >> Oct 19, 2023  3:17 PM Marco Morris wrote: Reason for CRM:  Patient had sleep study and a CPAP was recommended. Patient would like to know what's the next step. Please call patient.  Spoke with patient regarding prior message. Advised patient the order and sleep study has been received from [employer] on 10/16/2023 and patient should get a call from them soon to pick up CPAP machine . Patient's voice was understanding. Nothing else further needed.

## 2023-11-22 ENCOUNTER — Other Ambulatory Visit: Payer: Self-pay | Admitting: Pulmonary Disease

## 2023-12-01 IMAGING — US US ABDOMEN COMPLETE
1 series · 15 of 25 positions shown · non-contrast
Comparison: CT done on 06/23/2021

CLINICAL DATA: Abdominal pain

EXAM:
ABDOMEN ULTRASOUND COMPLETE

[Series 1: us abdomen complete mc & wl · 15 of 120 slices shown]
[im 1/120]
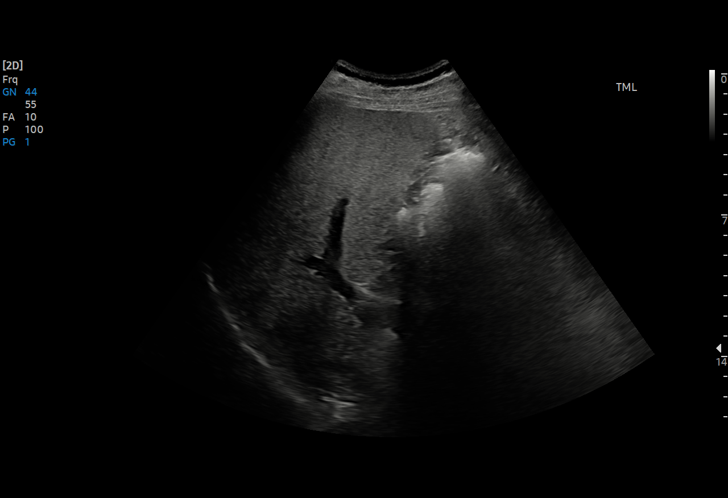
[im 10/120]
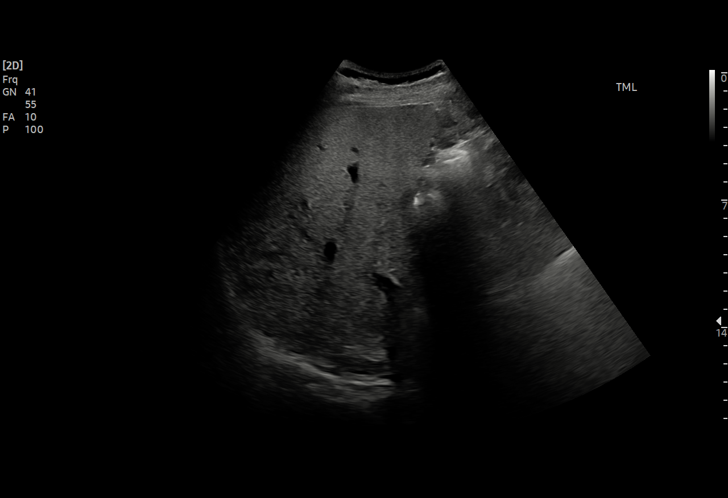
[im 20/120]
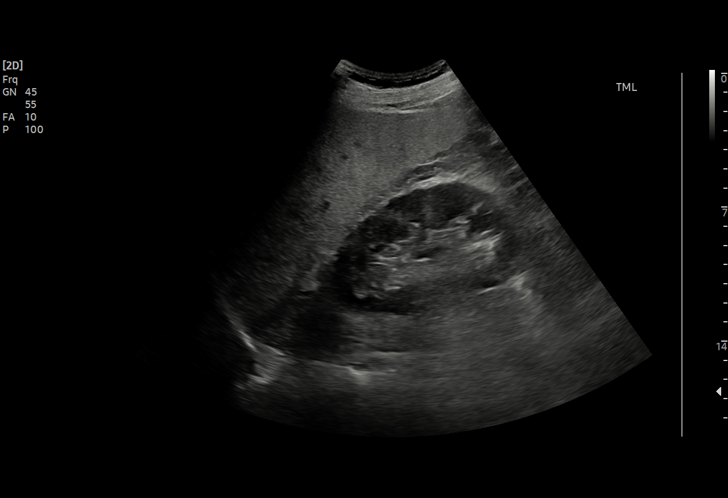
[im 25/120]
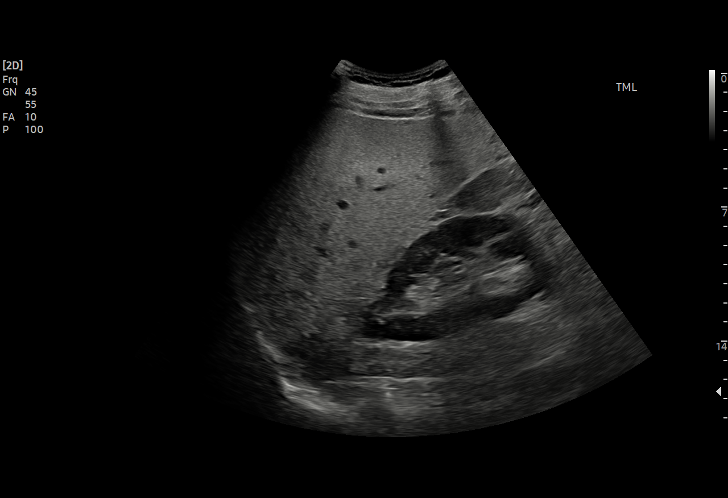
[im 35/120]
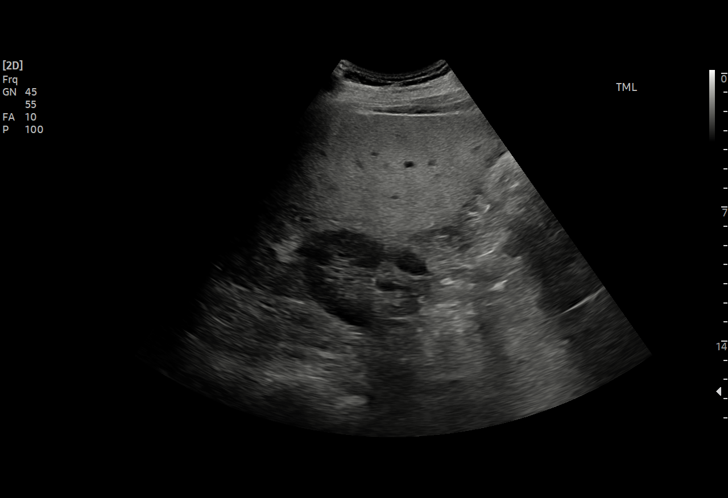
[im 45/120]
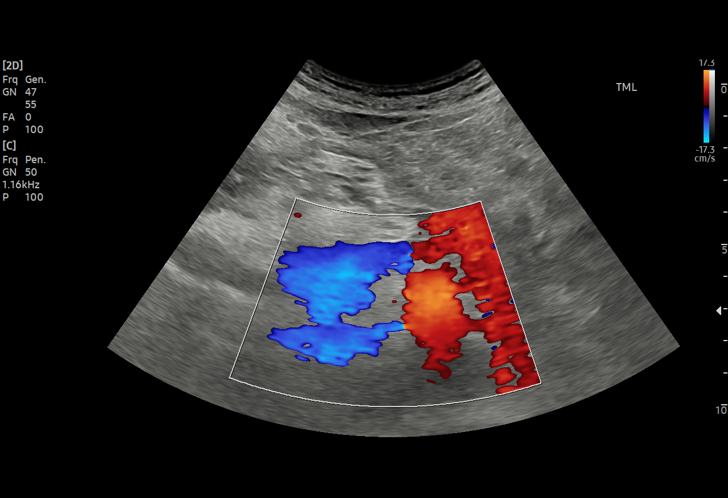
[im 50/120]
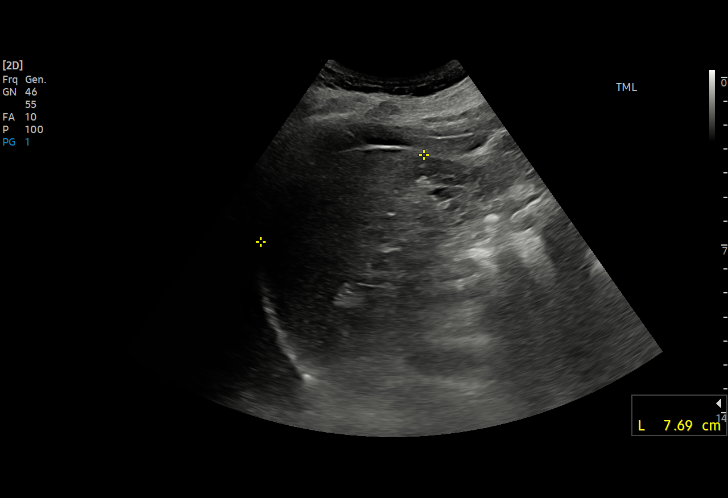
[im 60/120]
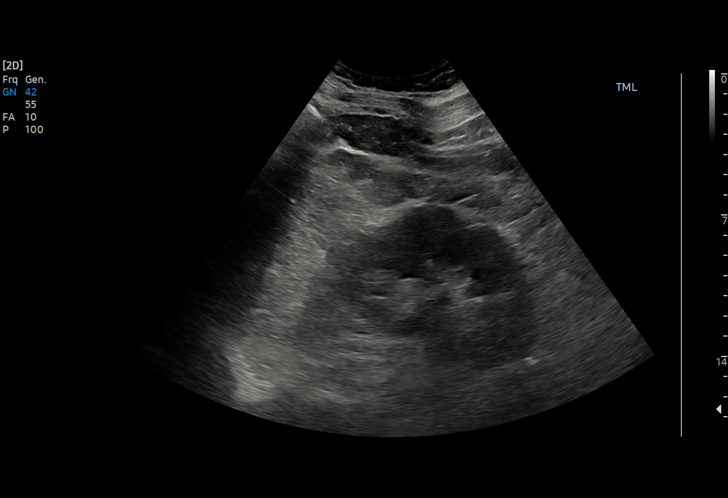
[im 70/120]
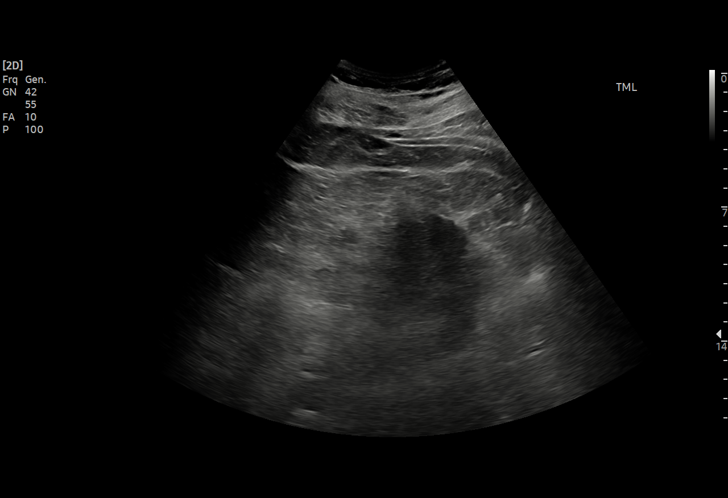
[im 75/120]
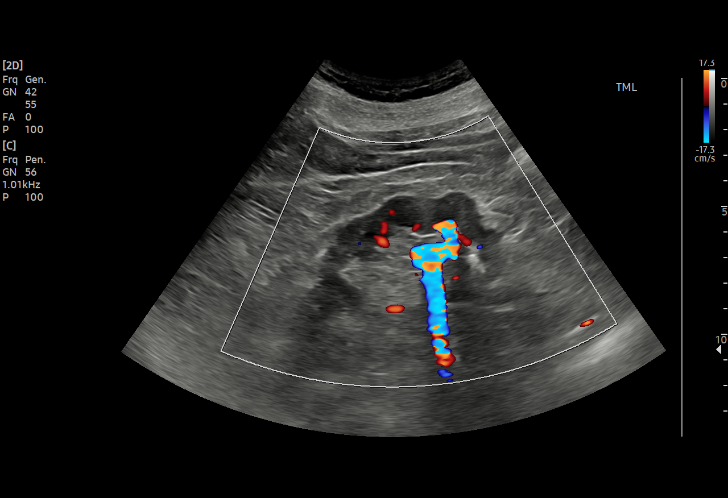
[im 85/120]
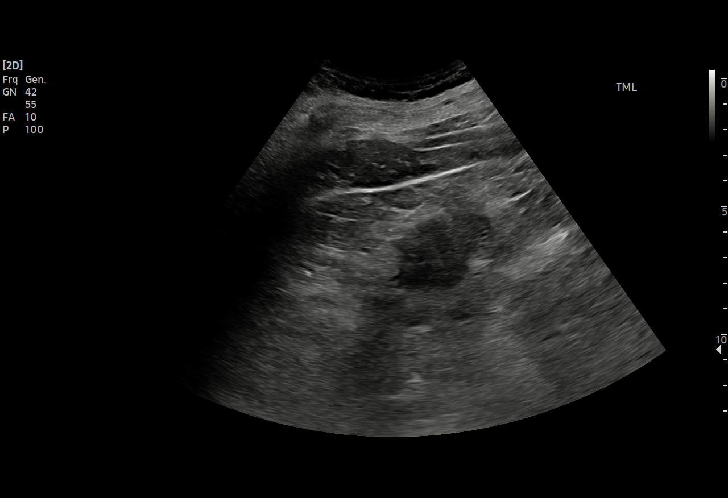
[im 95/120]
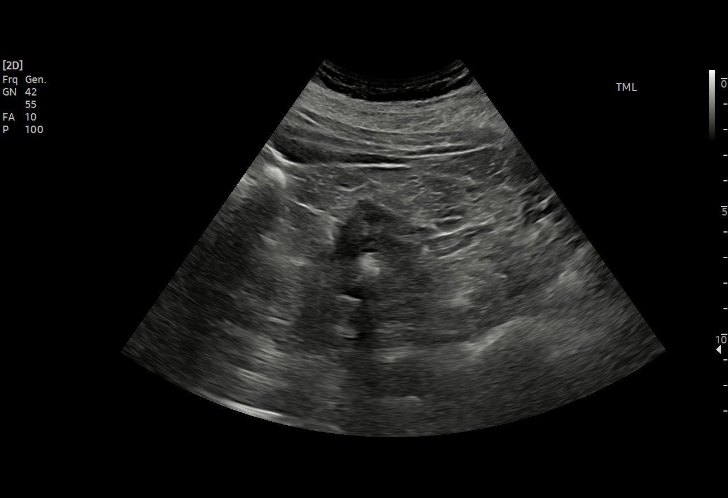
[im 100/120]
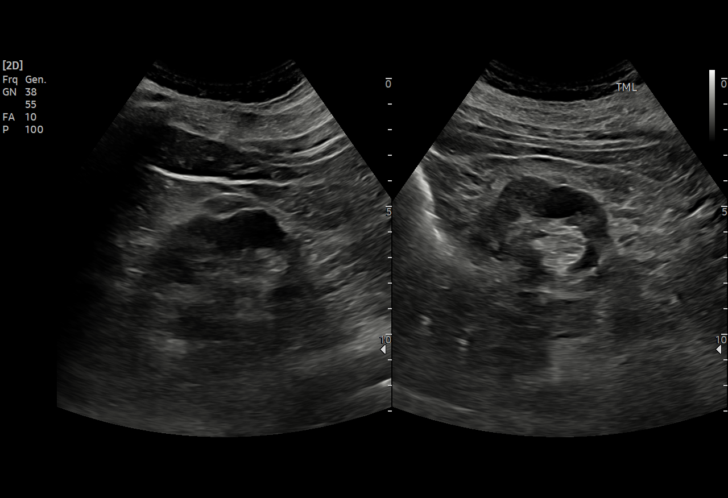
[im 110/120]
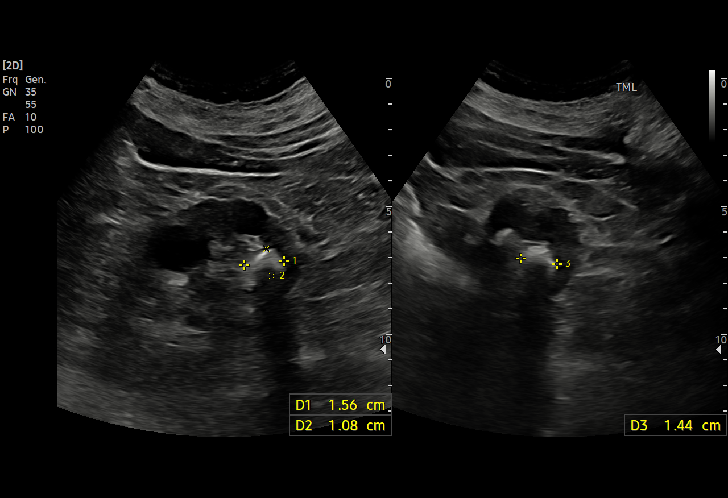
[im 120/120]
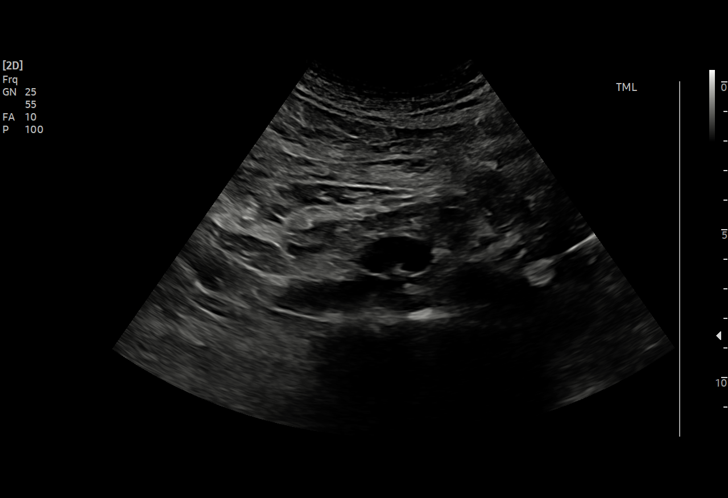

[15 of 25 positions shown; findings below may reference images not displayed]

FINDINGS: Gallbladder: Gallbladder is not seen consistent with
cholecystectomy.

Common bile duct: Diameter: 4.8 mm

Liver: There is increased echogenicity in the liver suggesting fatty
infiltration. No focal abnormality is seen. Portal vein is patent on
color Doppler imaging with normal direction of blood flow towards
the liver.

IVC: No abnormality visualized.

Pancreas: Visualized portion unremarkable.

Spleen: Size and appearance within normal limits.

Right Kidney: Length: 10.9 cm. There is no hydronephrosis. There is
5 mm hyperechoic focus in the lower pole of right kidney

Left Kidney: Length: 9.7 cm. There is no hydronephrosis. There are
multiple calculi in the lower pole of left kidney each measuring
less than 1.6 cm. There is 2.2 cm cyst in the midportion left
kidney. There is 1.7 cm cyst in the lower pole.

Abdominal aorta: No aneurysm visualized.

Other findings: None.
IMPRESSION: Bilateral renal stones. There are 2 left renal cysts, larger cyst
measuring 2.2 cm in diameter.

Fatty liver.  Status post cholecystectomy.

## 2024-01-07 ENCOUNTER — Encounter (HOSPITAL_BASED_OUTPATIENT_CLINIC_OR_DEPARTMENT_OTHER): Payer: Self-pay | Admitting: Adult Health

## 2024-01-07 ENCOUNTER — Ambulatory Visit (HOSPITAL_BASED_OUTPATIENT_CLINIC_OR_DEPARTMENT_OTHER): Admitting: Adult Health

## 2024-01-10 ENCOUNTER — Encounter (HOSPITAL_BASED_OUTPATIENT_CLINIC_OR_DEPARTMENT_OTHER): Payer: Self-pay | Admitting: Adult Health

## 2024-01-10 ENCOUNTER — Ambulatory Visit (HOSPITAL_BASED_OUTPATIENT_CLINIC_OR_DEPARTMENT_OTHER): Admitting: Adult Health

## 2024-01-10 VITALS — BP 138/82 | HR 74 | Ht 73.0 in | Wt 191.0 lb

## 2024-01-10 DIAGNOSIS — J4550 Severe persistent asthma, uncomplicated: Secondary | ICD-10-CM

## 2024-01-10 DIAGNOSIS — I480 Paroxysmal atrial fibrillation: Secondary | ICD-10-CM

## 2024-01-10 DIAGNOSIS — G4733 Obstructive sleep apnea (adult) (pediatric): Secondary | ICD-10-CM | POA: Diagnosis not present

## 2024-01-10 NOTE — Patient Instructions (Addendum)
 Continue CPAP At bedtime, wear all night for at least 6hr each night  Adjust CPAP 10-20cmH2o.  If not able to decrease sleep events will need to set up CPAP titration study.  Saline nasal spray Twice daily   Saline nasal gel At bedtime   Do not drive if sleepy  Work on healthy weight  Continue on Symbicort , Spiriva  and Nucala  .  Albuterol  inhaler As needed   Flu shot this Fall  Follow up in 3 months with Dr. Theophilus or Kordell Jafri NP  and As needed

## 2024-01-10 NOTE — Progress Notes (Signed)
 @Patient  ID: Marco Morris, male    DOB: July 12, 1955, 68 y.o.   MRN: [id number]  Chief Complaint  Patient presents with   Follow-up    OSA    Referring provider: Valma Carwin, MD  HPI: 68 year old male former smoker (quit smoking 1979) followed for severe persistent asthma and obstructive sleep apnea Referred for sleep consult Oct 12, 2023 for sleep apnea Patient is a truck Scientist, research (medical) history significant for A-fib, symptomatic bradycardia status post pacemaker, hypertension  TEST/EVENTS :  Chest x-ray May 29, 2023 shows clear lungs  HST 07/28/23 AHI 54.1/hr,   PFTs January 13, 2020 FEV1 99%, ratio 81, FVC 91%, positive bronchodilator response, DLCO 98%  Nucala  started December 2021    01/10/2024 Follow up: OSA and Asthma  Discussed the use of AI scribe software for clinical note transcription with the patient, who gave verbal consent to proceed.  History of Present Illness Marco Morris is a 68 year old male with severe sleep apnea who presents for a three-month follow-up regarding sleep apnea and asthma  He was diagnosed with severe sleep apnea earlier this year, with a sleep study showing AHI 54.1/hr .  He started using CPAP after being diagnosed with severe sleep apnea, last visit. He experiences difficulty adjusting to the CPAP, with mask leakage and discomfort. Initially, he was sent a large mask, which was incorrect, and he has not been able to use the new apparatus due to a size mismatch. He experiences a lot of leakage, especially as the air pressure increases, which disrupts his sleep and causes anxiety and a racing heart. He feels claustrophobic initially but adjusts after a few minutes. He is concerned about waking his wife due to the noise from the mask leakage.  Does feel better with less sleepiness since starting on CPAP.  Also has decreased fatigue.  He uses the CPAP machine for about eight hours a day, but the data shows inconsistent usage, possibly due  to wearing it during the day and the machine not recording it correctly. He mentions taking it off when getting up to use the bathroom, which might reset the machine. 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  CPAP download shows 70% compliance.  Daily average usage at 5.5 hours.  Patient is on auto CPAP 5 to 15 cm H2O.  Daily average pressure at 13.6 cm H2O.  AHI 19.6/hour.  Few central events.  Positive mask leaks.   HGis asthma is well-controlled, and he remains active, continuing to run and bike. 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 He uses Symbicort  and Spiriva  for asthma management. Also on Nucala . He experiences dry mouth occasionally, which he attributes to sleeping with his mouth open despite going to sleep breathing through his nose. No flare in cough or wheezing      Allergies  Allergen Reactions   Codeine Nausea And Vomiting    Immunization History  Administered Date(s) Administered   Fluad Quad(high Dose 65+) 02/12/2019, 01/02/2022   INFLUENZA, HIGH DOSE SEASONAL PF 03/16/2023   Influenza-Unspecified 02/21/2017, 01/14/2020   PFIZER(Purple Top)SARS-COV-2 Vaccination 06/19/2019, 07/10/2019    Past Medical History:  Diagnosis Date   Blood dyscrasia    high risk for blood clot formation   Chronic kidney disease    History of renal calculi    Hypercholesterolemia    Hypertension    Hypothyroidism    Neuromuscular  disorder (HCC)    numbness left foot   Pacemaker battery depletion    PAF (paroxysmal atrial fibrillation) (HCC)    a.  anticoagulated with Xarelto  b. s/p Tikosyn  loading 07/2013 c. s/p PVI   Symptomatic bradycardia    Tachycardia-bradycardia syndrome (HCC)    a. post termination pauses b. s/p STJ leadless pacemaker    Tobacco History: Social History   Tobacco Use  Smoking Status Former   Current packs/day: 0.00   Average packs/day: 1.8 packs/day for 8.0  years (14.0 ttl pk-yrs)   Types: Cigarettes   Start date: 32   Quit date: 05/15/1977   Years since quitting: 46.6  Smokeless Tobacco Former   Types: Chew   Quit date: 01/13/2013   Counseling given: Not Answered   Outpatient Medications Prior to Visit  Medication Sig Dispense Refill   albuterol  (VENTOLIN  HFA) 108 (90 Base) MCG/ACT inhaler USE 2 INHALATIONS EVERY 4 HOURS AS NEEDED FOR WHEEZING OR SHORTNESS OF BREATH 17 g 10   atorvastatin  (LIPITOR) 20 MG tablet Take 1 tablet (20 mg total) by mouth every evening. 90 tablet 2   budesonide -formoterol  (SYMBICORT ) 80-4.5 MCG/ACT inhaler USE 1 INHALATION IN THE MORNING AND AT BEDTIME 30.6 g 1   EPINEPHrine  0.3 mg/0.3 mL IJ SOAJ injection Inject 0.3 mg into the muscle as needed for anaphylaxis. 1 each 1   levocetirizine (XYZAL ) 5 MG tablet Take 5 mg by mouth daily after breakfast.      levothyroxine  (SYNTHROID , LEVOTHROID) 125 MCG tablet Take 125 mcg by mouth daily before breakfast.     lisinopril  (PRINIVIL ,ZESTRIL ) 10 MG tablet TAKE 1 TABLET (10 MG TOTAL) BY MOUTH DAILY. 30 tablet 0   loperamide  (IMODIUM  A-D) 2 MG tablet Take 1 tablet (2 mg total) by mouth every morning. (Patient taking differently: Take 2 mg by mouth every morning. Patient takes as needed.) 30 tablet 0   NUCALA  100 MG/ML SOAJ INJECT 1 ML (100 MG TOTAL) UNDER THE SKIN EVERY 28 DAYS (APPOINTMENT NEEDED FOR FURTHER REFILLS) 3 mL 11   omeprazole  (PRILOSEC) 20 MG capsule TAKE 1 CAPSULE DAILY 90 capsule 2   potassium chloride  SA (KLOR-CON ) 20 MEQ tablet Take 20 mEq by mouth daily after breakfast.      rivaroxaban  (XARELTO ) 20 MG TABS tablet Take 1 tablet (20 mg total) by mouth daily with supper. 30 tablet 0   Tiotropium Bromide  Monohydrate (SPIRIVA  RESPIMAT) 2.5 MCG/ACT AERS Inhale 2 puffs into the lungs daily. 2 puff once per day     No facility-administered medications prior to visit.     Review of Systems:   Constitutional:   No  weight loss, night sweats,  Fevers, chills,  fatigue, or  lassitude.  HEENT:   No headaches,  Difficulty swallowing,  Tooth/dental problems, or  Sore throat,                No sneezing, itching, ear ache, nasal congestion, post nasal drip,   CV:  No chest pain,  Orthopnea, PND, swelling in lower extremities, anasarca, dizziness, palpitations, syncope.   GI  No heartburn, indigestion, abdominal pain, nausea, vomiting, diarrhea, change in bowel habits, loss of appetite, bloody stools.   Resp: No shortness of breath with exertion or at rest.  No excess mucus, no productive cough,  No non-productive cough,  No coughing up of blood.  No change in color of mucus.  No wheezing.  No chest wall deformity  Skin: no rash or lesions.  GU: no dysuria, change in color of urine,  no urgency or frequency.  No flank pain, no hematuria   MS:  No joint pain or swelling.  No decreased range of motion.  No back pain.    Physical Exam  BP 138/82 (BP Location: Left Arm, Patient Position: Sitting)   Pulse 74   Ht 6' 1 (1.854 m)   Wt 191 lb (86.6 kg)   SpO2 95%   BMI 25.20 kg/m   GEN: A/Ox3; pleasant , NAD, well nourished    HEENT:  [place]/AT,   NOSE-clear, THROAT-clear, no lesions, no postnasal drip or exudate noted.   NECK:  Supple w/ fair ROM; no JVD; normal carotid impulses w/o bruits; no thyromegaly or nodules palpated; no lymphadenopathy.    RESP  Clear  P & A; w/o, wheezes/ rales/ or rhonchi. no accessory muscle use, no dullness to percussion  CARD:  RRR, no m/r/g, no peripheral edema, pulses intact, no cyanosis or clubbing.  GI:   Soft & nt; nml bowel sounds; no organomegaly or masses detected.   Musco: Warm bil, no deformities or joint swelling noted.   Neuro: alert, no focal deficits noted.    Skin: Warm, no lesions or rashes    Lab Results:  CBC    Component Value Date/Time   WBC 6.8 05/29/2023 1858   RBC 4.87 05/29/2023 1858   HGB 14.6 05/29/2023 1858   HGB 14.6 05/24/2022 1010   HCT 41.5 05/29/2023 1858   HCT 42.3  05/24/2022 1010   PLT 154 05/29/2023 1858   PLT 176 05/24/2022 1010   MCV 85.2 05/29/2023 1858   MCV 87 05/24/2022 1010   MCH 30.0 05/29/2023 1858   MCHC 35.2 05/29/2023 1858   RDW 13.7 05/29/2023 1858   RDW 13.4 05/24/2022 1010   LYMPHSABS 2.0 01/13/2020 1106   LYMPHSABS 2.1 10/20/2019 0954   MONOABS 0.6 01/13/2020 1106   EOSABS 0.5 01/13/2020 1106   EOSABS 0.5 (H) 10/20/2019 0954   BASOSABS 0.0 01/13/2020 1106   BASOSABS 0.0 10/20/2019 0954    BMET    Component Value Date/Time   NA 135 05/29/2023 1858   NA 138 05/24/2022 1010   K 3.9 05/29/2023 1858   CL 101 05/29/2023 1858   CO2 26 05/29/2023 1858   GLUCOSE 188 (H) 05/29/2023 1858   BUN 10 05/29/2023 1858   BUN 9 05/24/2022 1010   CREATININE 1.14 05/29/2023 1858   CALCIUM  9.7 05/29/2023 1858   GFRNONAA >60 05/29/2023 1858   GFRAA >60 10/24/2019 0449    BNP No results found for: BNP  ProBNP    Component Value Date/Time   PROBNP 56.2 06/05/2013 0850    Imaging: No results found.  Administration History     None          Latest Ref Rng & Units 01/13/2020    8:53 AM 02/05/2017    8:57 AM  PFT Results  FVC-Pre L 4.02  3.85   FVC-Predicted Pre % 81  77   FVC-Post L 4.51  4.46   FVC-Predicted Post % 91  89   Pre FEV1/FVC % % 79  78   Post FEV1/FCV % % 81  79   FEV1-Pre L 3.18  2.99   FEV1-Predicted Pre % 87  79   FEV1-Post L 3.66  3.54   DLCO uncorrected ml/min/mmHg 27.85  28.16   DLCO UNC% % 98  80   DLCO corrected ml/min/mmHg 27.85  28.01   DLCO COR %Predicted % 98  79   DLVA Predicted % 103  84   TLC L 7.10  7.77   TLC % Predicted % 95  104   RV % Predicted % 111  132     Lab Results  Component Value Date   NITRICOXIDE 17 06/18/2017        Assessment & Plan:   No problem-specific Assessment & Plan notes found for this encounter. Assessment and Plan Assessment & Plan Severe obstructive sleep apnea   Severe obstructive sleep apnea  with mask leakage and increased residual  events.  He reports claustrophobia and anxiety with CPAP use. Current CPAP pressure is 5-15 cm H2O, A titration study may be needed to optimize settings. Alternative treatments like the Inspire device were discussed, but the focus remains on optimizing CPAP therapy. Adjust CPAP pressure to 10-20 cm H2O and contact the supplier to correct mask size. Consider a titration study if symptoms/residual events  do not improve after adjustments. Use saline nasal gel and spray to prevent dryness. Encourage wearing CPAP every night for at least four hours or more.  Discuss the potential for the Inspire device if CPAP therapy fails. Call if symptoms do not improve after one month.    Asthma   Asthma is well-controlled with current medications. No recent exacerbations or wheezing. He remains active and continues to work as a Naval architect. Continue current asthma medications, Symbicort  and Spiriva , Nucala . Asthma action plan discussed . Albuterol  inhaler  As needed   Rinse mouth after using inhalers. Flu shot this fall   Hx of A Fib -continue on current regimen and follow up with Cardiology   Plan  Patient Instructions  Continue CPAP At bedtime, wear all night for at least 6hr each night  Adjust CPAP 10-20cmH2o.  If not able to decrease sleep events will need to set up CPAP titration study.  Saline nasal spray Twice daily   Saline nasal gel At bedtime   Do not drive if sleepy  Work on healthy weight  Continue on Symbicort , Spiriva  and Nucala  .  Albuterol  inhaler As needed   Flu shot this Fall  Follow up in 3 months with Dr. Theophilus or Jamey Harman NP  and As needed           Madelin Stank, NP 01/10/2024

## 2024-01-15 ENCOUNTER — Telehealth (HOSPITAL_BASED_OUTPATIENT_CLINIC_OR_DEPARTMENT_OTHER): Payer: Self-pay

## 2024-01-15 NOTE — Telephone Encounter (Signed)
 Copied from CRM [id number]. Topic: Clinical - Medical Advice >> Jan 15, 2024 10:22 AM Devaughn RAMAN wrote: Reason for CRM: Patient is calling in regarding an implant for sleep apnea, patient stated he is on a CPAP now and he would like further information on the implant.

## 2024-01-17 NOTE — Telephone Encounter (Signed)
 ATC X1. LMTCB

## 2024-01-17 NOTE — Telephone Encounter (Signed)
 Has to be CPAP intolerant.  If wants to discuss INSPIRE as we talked about at ov  Will need DICE procedure to see if candidate then refer to ENT if he cleared to go forward to evaluate for implant  After implant , heals for around 1 month and then activation process starts w/ titration process for around 90 days than back to sleep lab . Can discuss at follow up

## 2024-01-18 ENCOUNTER — Ambulatory Visit: Admitting: Adult Health

## 2024-01-21 NOTE — Telephone Encounter (Signed)
 I called and spoke to pt. Pt informed of Tammy's note and pt verbalized understanding. NFN

## 2024-03-03 NOTE — Progress Notes (Unsigned)
  Electrophysiology Office Note:   ID:  Marco Morris, DOB 02-28-1956, MRN [id number]  Primary Cardiologist: None Electrophysiologist: Eulas FORBES Furbish, MD  {Click to update primary MD,subspecialty MD or APP then REFRESH:1}    History of Present Illness:   Marco Morris is a 68 y.o. male with h/o symptomatic bradycardia s/p St Jude (Leadless PPM), PAF, HTN, OAC on Xarelto , and COPD seen today for routine electrophysiology followup.   Since last being seen in our clinic the patient reports doing ***.  he denies chest pain, palpitations, dyspnea, PND, orthopnea, nausea, vomiting, dizziness, syncope, edema, weight gain, or early satiety.   Review of systems complete and found to be negative unless listed in HPI.   EP Information / Studies Reviewed:    EKG is not ordered today. EKG from NSR reviewed which showed NSR at 80 bpm       PPM Interrogation-  reviewed in detail today,  See PACEART report.  Arrhythmia/Device History Abbott leadless PPM implanted 2015, original device explanted with new leadless PPM implant on 10/23/2019   AFib Hx Diagnosed 2013 Tikosyn  2015 >> stopped post ablation PVI ablation 10/09/2013    Physical Exam:   VS:  There were no vitals taken for this visit.   Wt Readings from Last 3 Encounters:  01/10/24 191 lb (86.6 kg)  10/12/23 194 lb 6.4 oz (88.2 kg)  07/18/23 193 lb 6.4 oz (87.7 kg)     GEN: No acute distress  NECK: No JVD; No carotid bruits CARDIAC: {EPRHYTHM:28826}, no murmurs, rubs, gallops RESPIRATORY:  Clear to auscultation without rales, wheezing or rhonchi  ABDOMEN: Soft, non-tender, non-distended EXTREMITIES:  {EDEMA LEVEL:28147::No} edema; No deformity   ASSESSMENT AND PLAN:    Symptomatic bradycardia s/p Abbott Leadless PPM  Normal PPM function See Pace Art report No changes today  Paroxysmal AF No symptoms to suggest recurrence Continue xarelto  20 mg daily  {Click here to Review PMH, Prob List, Meds, Allergies, SHx, FHx   :1}   Disposition:   Follow up with {EPPROVIDERS:28135::EP Team} {EPFOLLOW UP:28173}  Signed, Ozell Prentice Passey, PA-C

## 2024-03-04 ENCOUNTER — Encounter: Payer: Self-pay | Admitting: Student

## 2024-03-04 ENCOUNTER — Telehealth: Payer: Self-pay

## 2024-03-04 ENCOUNTER — Ambulatory Visit

## 2024-03-04 ENCOUNTER — Ambulatory Visit: Attending: Student | Admitting: Student

## 2024-03-04 VITALS — BP 132/82 | HR 96 | Ht 73.0 in | Wt 197.0 lb

## 2024-03-04 DIAGNOSIS — I495 Sick sinus syndrome: Secondary | ICD-10-CM

## 2024-03-04 DIAGNOSIS — I480 Paroxysmal atrial fibrillation: Secondary | ICD-10-CM | POA: Diagnosis not present

## 2024-03-04 DIAGNOSIS — Z01810 Encounter for preprocedural cardiovascular examination: Secondary | ICD-10-CM | POA: Diagnosis not present

## 2024-03-04 LAB — CUP PACEART INCLINIC DEVICE CHECK
Date Time Interrogation Session: 20251021093011
Implantable Pulse Generator Implant Date: 20210610
Pulse Gen Serial Number: 1304509

## 2024-03-04 LAB — CBC

## 2024-03-04 NOTE — Telephone Encounter (Signed)
 Spoke w/ patient regarding remote monitoring. Previous OV w/ Prentice Passey, PA. During visit, St. Jude Representative assisted patient in setup of home monitoring and educated on how to use monitor to send a transmission. Patient scheduled to start remote monitoring every 91 days after today.   Patient given the dates for remote monitor checks and verbalized understanding of when to send manual transmissions. Informed to contact device clinic for any further questions or concerns.

## 2024-03-04 NOTE — Patient Instructions (Signed)
 Medication Instructions:  Your physician recommends that you continue on your current medications as directed. Please refer to the Current Medication list given to you today.  *If you need a refill on your cardiac medications before your next appointment, please call your pharmacy*  Lab Work: BMET, CBC-TODAY If you have labs (blood work) drawn today and your tests are completely normal, you will receive your results only by: MyChart Message (if you have MyChart) OR A paper copy in the mail If you have any lab test that is abnormal or we need to change your treatment, we will call you to review the results.  Follow-Up: At [facility], you and your health needs are our priority.  As part of our continuing mission to provide you with exceptional heart care, our providers are all part of one team.  This team includes your primary Cardiologist (physician) and Advanced Practice Providers or APPs (Physician Assistants and Nurse Practitioners) who all work together to provide you with the care you need, when you need it.  Your next appointment:   1 year(s)  Provider:   Ozell Jodie Passey, PA-C

## 2024-03-05 ENCOUNTER — Ambulatory Visit: Payer: Self-pay | Admitting: Student

## 2024-03-05 LAB — BASIC METABOLIC PANEL WITH GFR
BUN/Creatinine Ratio: 11 (ref 10–24)
BUN: 12 mg/dL (ref 8–27)
CO2: 21 mmol/L (ref 20–29)
Calcium: 9.8 mg/dL (ref 8.6–10.2)
Chloride: 100 mmol/L (ref 96–106)
Creatinine, Ser: 1.06 mg/dL (ref 0.76–1.27)
Glucose: 147 mg/dL — ABNORMAL HIGH (ref 70–99)
Potassium: 4.2 mmol/L (ref 3.5–5.2)
Sodium: 139 mmol/L (ref 134–144)
eGFR: 75 mL/min/1.73 (ref >59)

## 2024-03-05 LAB — CBC
Hematocrit: 42.7 % (ref 37.5–51.0)
Hemoglobin: 13.9 g/dL (ref 13.0–17.7)
MCH: 29.3 pg (ref 26.6–33.0)
MCHC: 32.6 g/dL (ref 31.5–35.7)
MCV: 90 fL (ref 79–97)
Platelets: 163 x10E3/uL (ref 150–450)
RBC: 4.74 x10E6/uL (ref 4.14–5.80)
RDW: 14.1 % (ref 11.6–15.4)
WBC: 6.9 x10E3/uL (ref 3.4–10.8)

## 2024-03-18 ENCOUNTER — Other Ambulatory Visit: Payer: Self-pay | Admitting: Student

## 2024-03-20 ENCOUNTER — Ambulatory Visit (INDEPENDENT_AMBULATORY_CARE_PROVIDER_SITE_OTHER): Admitting: Adult Health

## 2024-03-20 ENCOUNTER — Encounter: Payer: Self-pay | Admitting: Adult Health

## 2024-03-20 VITALS — BP 134/82 | HR 85 | Temp 97.6°F | Ht 73.0 in | Wt 196.2 lb

## 2024-03-20 DIAGNOSIS — I480 Paroxysmal atrial fibrillation: Secondary | ICD-10-CM

## 2024-03-20 DIAGNOSIS — I495 Sick sinus syndrome: Secondary | ICD-10-CM | POA: Diagnosis not present

## 2024-03-20 DIAGNOSIS — J4540 Moderate persistent asthma, uncomplicated: Secondary | ICD-10-CM

## 2024-03-20 DIAGNOSIS — G4733 Obstructive sleep apnea (adult) (pediatric): Secondary | ICD-10-CM

## 2024-03-20 NOTE — Progress Notes (Signed)
 @Patient  ID: [name], male    DOB: December 12, 1955, 68 y.o.   MRN: [id number]  Chief Complaint  Patient presents with   Follow-up    OSA and asthma f/u    Referring provider: Valma Carwin, MD  HPI: 68 year old male former smoker followed for severe persistent asthma and obstructive sleep apnea Referred for sleep consult Oct 12, 2023 for sleep apnea Patient is a truck driver gets CDL license Medical history significant for atrial fib, symptomatic bradycardia status post pacemaker and hypertension    TEST/EVENTS : Reviewed 03/20/2024  Chest x-ray May 29, 2023 shows clear lungs   HST 07/28/23 AHI 54.1/hr,    PFTs January 13, 2020 FEV1 99%, ratio 81, FVC 91%, positive bronchodilator response, DLCO 98%   Nucala  started December 2021  Discussed the use of AI scribe software for clinical note transcription with the patient, who gave verbal consent to proceed.  History of Present Illness TAKAO LIZER is a 68 year old male with severe sleep apnea who presents with difficulties using CPAP therapy and claustrophobia.  He experiences significant difficulties with CPAP therapy due to mask fit and claustrophobia.  Sleep study showed AHI at 54.1/hour. Despite adjustments to pressure settings and trying different masks, he continues to have residual episodes of sleep apnea.  Also has difficulty tolerating mask.  We have tried several different mask to help with comfort also have tried different pressure settings along with desensitization at home  He has tried sitting on the edge of the bed to calm down before lying down, but experiences increased heart rate and breathing difficulties initially. Once the air flow stabilizes, he feels better. He is currently using a fullface mask. CPAP download shows 100% compliance with daily average usage at 5 hours patient is on auto CPAP 10 to 20 cm H2O.  AHI 21.1/hour an Daily average pressure at 16.6 cm H2O.  Positive mask leaks.  Wants more  information on inspire device.  Patient education was given.  Would like to try an alternative mask.  We looked at the ResMed N30 I nasal mask.  Sample was given  Patient has moderate persistent asthma.  Says overall breathing is doing okay.  He remains on Symbicort  and Spiriva .  He is on Nucala .reports no recent asthma flare-ups. He has received his flu shot.  No increased albuterol  use.  Patient has atrial fibrillation and is on Xarelto .  Followed by cardiology has a pacemaker.    He drives long distances for work, holding a [employer], and has been compliant with CPAP usage, achieving 100% usage and over four hours per night, which meets the requirements for his job. He reports being active and continues to work, . He has no issues with his blood thinner medication and remains in good health otherwise.    Allergies  Allergen Reactions   Codeine Nausea And Vomiting    Immunization History  Administered Date(s) Administered   Fluad Quad(high Dose 65+) 02/12/2019, 01/02/2022   INFLUENZA, HIGH DOSE SEASONAL PF 03/16/2023   Influenza-Unspecified 02/21/2017, 01/14/2020   PFIZER(Purple Top)SARS-COV-2 Vaccination 06/19/2019, 07/10/2019    Past Medical History:  Diagnosis Date   Blood dyscrasia    high risk for blood clot formation   Chronic kidney disease    History of renal calculi    Hypercholesterolemia    Hypertension    Hypothyroidism    Neuromuscular disorder (HCC)    numbness left foot   Pacemaker battery depletion    PAF (paroxysmal atrial fibrillation) (HCC)  a.  anticoagulated with Xarelto  b. s/p Tikosyn  loading 07/2013 c. s/p PVI   Symptomatic bradycardia    Tachycardia-bradycardia syndrome (HCC)    a. post termination pauses b. s/p STJ leadless pacemaker    Tobacco History: Social History   Tobacco Use  Smoking Status Former   Current packs/day: 0.00   Average packs/day: 1.8 packs/day for 8.0 years (14.0 ttl pk-yrs)   Types: Cigarettes   Start date: 42    Quit date: 05/15/1977   Years since quitting: 46.8  Smokeless Tobacco Former   Types: Chew   Quit date: 01/13/2013   Counseling given: Not Answered   Outpatient Medications Prior to Visit  Medication Sig Dispense Refill   albuterol  (VENTOLIN  HFA) 108 (90 Base) MCG/ACT inhaler USE 2 INHALATIONS EVERY 4 HOURS AS NEEDED FOR WHEEZING OR SHORTNESS OF BREATH 17 g 10   atorvastatin  (LIPITOR) 20 MG tablet TAKE 1 TABLET EVERY EVENING 90 tablet 3   budesonide -formoterol  (SYMBICORT ) 80-4.5 MCG/ACT inhaler USE 1 INHALATION IN THE MORNING AND AT BEDTIME 30.6 g 1   EPINEPHrine  0.3 mg/0.3 mL IJ SOAJ injection Inject 0.3 mg into the muscle as needed for anaphylaxis. 1 each 1   levocetirizine (XYZAL ) 5 MG tablet Take 5 mg by mouth daily after breakfast.      levothyroxine  (SYNTHROID , LEVOTHROID) 125 MCG tablet Take 125 mcg by mouth daily before breakfast.     lisinopril  (PRINIVIL ,ZESTRIL ) 10 MG tablet TAKE 1 TABLET (10 MG TOTAL) BY MOUTH DAILY. 30 tablet 0   loperamide  (IMODIUM  A-D) 2 MG tablet Take 1 tablet (2 mg total) by mouth every morning. (Patient taking differently: Take 2 mg by mouth every morning. Patient takes as needed.) 30 tablet 0   NUCALA  100 MG/ML SOAJ INJECT 1 ML (100 MG TOTAL) UNDER THE SKIN EVERY 28 DAYS (APPOINTMENT NEEDED FOR FURTHER REFILLS) 3 mL 11   omeprazole  (PRILOSEC) 20 MG capsule TAKE 1 CAPSULE DAILY 90 capsule 2   potassium chloride  SA (KLOR-CON ) 20 MEQ tablet Take 20 mEq by mouth daily after breakfast.      rivaroxaban  (XARELTO ) 20 MG TABS tablet Take 1 tablet (20 mg total) by mouth daily with supper. 30 tablet 0   Tiotropium Bromide  Monohydrate (SPIRIVA  RESPIMAT) 2.5 MCG/ACT AERS Inhale 2 puffs into the lungs daily. 2 puff once per day     No facility-administered medications prior to visit.     Review of Systems:   Constitutional:   No  weight loss, night sweats,  Fevers, chills, +fatigue, or  lassitude.  HEENT:   No headaches,  Difficulty swallowing,  Tooth/dental  problems, or  Sore throat,                No sneezing, itching, ear ache, nasal congestion, post nasal drip,   CV:  No chest pain,  Orthopnea, PND, swelling in lower extremities, anasarca, dizziness, palpitations, syncope.   GI  No heartburn, indigestion, abdominal pain, nausea, vomiting, diarrhea, change in bowel habits, loss of appetite, bloody stools.   Resp: No shortness of breath with exertion or at rest.  No excess mucus, no productive cough,  No non-productive cough,  No coughing up of blood.  No change in color of mucus.  No wheezing.  No chest wall deformity  Skin: no rash or lesions.  GU: no dysuria, change in color of urine, no urgency or frequency.  No flank pain, no hematuria   MS:  No joint pain or swelling.  No decreased range of motion.  No back pain.  Physical Exam  BP 134/82   Pulse 85   Temp 97.6 F (36.4 C)   Ht 6' 1 (1.854 m) Comment: Per pt  Wt 196 lb 3.2 oz (89 kg)   SpO2 95% Comment: RA  BMI 25.89 kg/m   GEN: A/Ox3; pleasant , NAD, well nourished    HEENT:  [place]/AT,   NOSE-clear, THROAT-clear, no lesions, no postnasal drip or exudate noted.   NECK:  Supple w/ fair ROM; no JVD; normal carotid impulses w/o bruits; no thyromegaly or nodules palpated; no lymphadenopathy.    RESP  Clear  P & A; w/o, wheezes/ rales/ or rhonchi. no accessory muscle use, no dullness to percussion  CARD:  RRR, no m/r/g, no peripheral edema, pulses intact, no cyanosis or clubbing.  GI:   Soft & nt; nml bowel sounds; no organomegaly or masses detected.   Musco: Warm bil, no deformities or joint swelling noted.   Neuro: alert, no focal deficits noted.    Skin: Warm, no lesions or rashes    Lab Results:Reviewed 03/20/2024   CBC    Component Value Date/Time   WBC 6.9 03/04/2024 0954   WBC 6.8 05/29/2023 1858   RBC 4.74 03/04/2024 0954   RBC 4.87 05/29/2023 1858   HGB 13.9 03/04/2024 0954   HCT 42.7 03/04/2024 0954   PLT 163 03/04/2024 0954   MCV 90 03/04/2024  0954   MCH 29.3 03/04/2024 0954   MCH 30.0 05/29/2023 1858   MCHC 32.6 03/04/2024 0954   MCHC 35.2 05/29/2023 1858   RDW 14.1 03/04/2024 0954   LYMPHSABS 2.0 01/13/2020 1106   LYMPHSABS 2.1 10/20/2019 0954   MONOABS 0.6 01/13/2020 1106   EOSABS 0.5 01/13/2020 1106   EOSABS 0.5 (H) 10/20/2019 0954   BASOSABS 0.0 01/13/2020 1106   BASOSABS 0.0 10/20/2019 0954    BMET    Component Value Date/Time   NA 139 03/04/2024 0954   K 4.2 03/04/2024 0954   CL 100 03/04/2024 0954   CO2 21 03/04/2024 0954   GLUCOSE 147 (H) 03/04/2024 0954   GLUCOSE 188 (H) 05/29/2023 1858   BUN 12 03/04/2024 0954   CREATININE 1.06 03/04/2024 0954   CALCIUM  9.8 03/04/2024 0954   GFRNONAA >60 05/29/2023 1858   GFRAA >60 10/24/2019 0449    BNP No results found for: BNP  ProBNP    Component Value Date/Time   PROBNP 56.2 06/05/2013 0850    Imaging: CUP PACEART INCLINIC DEVICE CHECK Result Date: 03/04/2024 Device check in clinic by Industry Rep; See scanned document in media for details. Normal device function, V pacing < 1%, Estimated 16.8 years to ERI. Pt newly set up with remote follow up today. Will initiate 91 day remotes and annual visit.   Administration History     None          Latest Ref Rng & Units 01/13/2020    8:53 AM 02/05/2017    8:57 AM  PFT Results  FVC-Pre L 4.02  3.85   FVC-Predicted Pre % 81  77   FVC-Post L 4.51  4.46   FVC-Predicted Post % 91  89   Pre FEV1/FVC % % 79  78   Post FEV1/FCV % % 81  79   FEV1-Pre L 3.18  2.99   FEV1-Predicted Pre % 87  79   FEV1-Post L 3.66  3.54   DLCO uncorrected ml/min/mmHg 27.85  28.16   DLCO UNC% % 98  80   DLCO corrected ml/min/mmHg 27.85  28.01  DLCO COR %Predicted % 98  79   DLVA Predicted % 103  84   TLC L 7.10  7.77   TLC % Predicted % 95  104   RV % Predicted % 111  132     Lab Results  Component Value Date   NITRICOXIDE 17 06/18/2017        No data to display              Assessment & Plan:    Assessment and Plan Assessment & Plan Severe obstructive sleep apnea with CPAP intolerance and consideration for hypoglossal nerve stimulator (Inspire) therapy   He has severe obstructive sleep apnea with CPAP intolerance, having 54 episodes per hour. Claustrophobia and mask discomfort contribute to this intolerance. Current CPAP settings continue with residual sleep apneic events.  We discussed multiple options for management.  Patient education given on inspire device.  A DISE procedure will assess his candidacy for Inspire therapy, a hypoglossal nerve stimulator.  An alternative is a CPAP titration study to explore different pressure settings or BiPAP therapy if indicated. He received a ResMed N30 mask sample for trial use, and CPAP pressure settings were adjusted to 14cmH2o set pressure. DME adjusted his EPR to 1 , could consider raising this to see if more comfortable.(Only a few central residual events) . A CPAP titration study was ordered. Inspire therapy was discussed as a potential option if CPAP remains intolerable. Coordination with cardiology for clearance before Inspire surgery would be recommended.   Asthma, stable on current inhaler regimen   His asthma is well-controlled with Symbicort , Spiriva , and Nucala . No recent flare-ups have been reported. He should continue the current inhaler regimen.  Atrial fibrillation,appears stable.  His atrial fibrillation is managed with Xarelto   Continue on current regimen and follow-up with cardiology  Sick sinus syndrome- s/p pacemaker-stable  Continue follow-up with cardiology  Claustrophobia related to CPAP use   Claustrophobia related to CPAP use contributes to his CPAP intolerance. Attempts to manage with current CPAP settings and mask have been unsuccessful. He will trial the new ResMed N30 mask for comfort.  and CPAP settings have been adjusted to improve comfort.  Plan  Patient Instructions  Continue CPAP At bedtime, wear all night  for at least 6hr each night  Try Resmed N30i nasal mask sample.  Call back if you want me to turn off the ramp time.  Adjust CPAP 14cmH2o.  Set up CPAP titration study  Saline nasal spray Twice daily   Saline nasal gel At bedtime   Do not drive if sleepy  Work on healthy weight  Continue on Symbicort , Spiriva  and Nucala  .  Albuterol  inhaler As needed   Follow up in 6 weeks and As needed             Xariah Silvernail, NP 03/20/2024  I spent 42  minutes dedicated to the care of this patient on the date of this encounter to include pre-visit review of records, face-to-face time with the patient discussing conditions above, post visit ordering of testing, clinical documentation with the electronic health record, making appropriate referrals as documented, and communicating necessary findings to members of the patients care team.

## 2024-03-20 NOTE — Patient Instructions (Addendum)
 Continue CPAP At bedtime, wear all night for at least 6hr each night  Try Resmed N30i nasal mask sample.  Call back if you want me to turn off the ramp time.  Adjust CPAP 14cmH2o.  Set up CPAP titration study  Saline nasal spray Twice daily   Saline nasal gel At bedtime   Do not drive if sleepy  Work on healthy weight  Continue on Symbicort , Spiriva  and Nucala  .  Albuterol  inhaler As needed   Follow up in 6 weeks and As needed

## 2024-03-25 ENCOUNTER — Other Ambulatory Visit: Payer: Self-pay | Admitting: Cardiovascular Disease

## 2024-03-25 DIAGNOSIS — I480 Paroxysmal atrial fibrillation: Secondary | ICD-10-CM

## 2024-03-25 NOTE — Telephone Encounter (Signed)
 Prescription refill request for Xarelto  received.  Indication:afib Last office visit:10/25 Weight:89  kg Age:68 Scr:1.06  10/25 CrCl:81.63  ml/min  Prescription refilled

## 2024-04-01 ENCOUNTER — Telehealth: Payer: Self-pay

## 2024-04-01 ENCOUNTER — Telehealth: Payer: Self-pay | Admitting: Cardiovascular Disease

## 2024-04-01 NOTE — Telephone Encounter (Signed)
 Waiting for request

## 2024-04-01 NOTE — Telephone Encounter (Signed)
 [facility] called to f/u on a clearance that was faxed in today please advise

## 2024-04-01 NOTE — Telephone Encounter (Signed)
 Clearance has been plugged into patient chart and forward to preop APP pool

## 2024-04-01 NOTE — Telephone Encounter (Signed)
   Pre-operative Risk Assessment    Patient Name: Marco Morris  DOB: Oct 04, 1955 MRN: [id number]   Date of last office visit: 03/04/24 Date of next office visit:    Request for Surgical Clearance    Procedure:  Colonoscopy   Date of Surgery:  Clearance 04/08/24                                 Surgeon:  Dr. Estelita Manas  Surgeon's Group or Practice Name:  [facility]  Phone number:  [phone] Fax number:  [phone]   Type of Clearance Requested:   - Medical  - Pharmacy:  Hold Rivaroxaban  (Xarelto ) not indicated    Type of Anesthesia:  propofol     Additional requests/questions:    [name]   04/01/2024, 4:34 PM

## 2024-04-03 NOTE — Telephone Encounter (Signed)
   Patient Name: Marco Morris  DOB: 08/25/1955 MRN: [id number]  Primary Cardiologist: None  Chart reviewed as part of pre-operative protocol coverage.  To summarize recommendations:  - Patient was seen by Jodie Passey, PA-C about a month ago.  Was doing well at that time.  Normal pacemaker function.  No symptoms suggesting recurrence of paroxysmal atrial fibrillation.  Since he was doing well at his last office visit, no further cardiovascular testing needed prior to colonoscopy.  Patient has not had an Afib/aflutter ablation in the last 3 months, DCCV within the last 4 weeks or a watchman implanted in the last 45 days    Per office protocol, patient can hold Xarelto  for 2 days prior to procedure.  Please resume when medically safe to do so.    Will route this bundled recommendation to requesting provider via Epic fax function and remove from pre-op pool. Please call with questions.  Orren LOISE Fabry, PA-C 04/03/2024, 7:59 AM

## 2024-04-03 NOTE — Telephone Encounter (Signed)
 Patient with diagnosis of afib on Xarelto  for anticoagulation.    Procedure: colonoscopy Date of procedure: 04/08/24   CHA2DS2-VASc Score = 2   This indicates a 2.2% annual risk of stroke. The patient's score is based upon: CHF History: 0 HTN History: 1 Diabetes History: 0 Stroke History: 0 Vascular Disease History: 0 Age Score: 1 Gender Score: 0      CrCl 82 ml/min Platelet count 163  Patient has not had an Afib/aflutter ablation in the last 3 months, DCCV within the last 4 weeks or a watchman implanted in the last 45 days   Per office protocol, patient can hold Xarelto  for 2 days prior to procedure.    **This guidance is not considered finalized until pre-operative APP has relayed final recommendations.**

## 2024-04-08 ENCOUNTER — Other Ambulatory Visit: Payer: Self-pay | Admitting: Pulmonary Disease

## 2024-04-08 DIAGNOSIS — J4550 Severe persistent asthma, uncomplicated: Secondary | ICD-10-CM

## 2024-04-08 NOTE — Telephone Encounter (Signed)
 Refill sent for NUCALA  to [facility]: [phone]  Dose: 100mg  [place] every 28 days   Last OV: 03/20/24 Provider: Dr. Theophilus  Next OV: 04/14/24  Aleck Puls, PharmD, BCPS Clinical Pharmacist  [facility]

## 2024-04-08 NOTE — Telephone Encounter (Signed)
 Pt requesting refill of specialty medication - routing to Rx team to advise.

## 2024-04-14 ENCOUNTER — Ambulatory Visit: Admitting: Adult Health

## 2024-04-14 ENCOUNTER — Encounter: Payer: Self-pay | Admitting: Adult Health

## 2024-04-14 VITALS — BP 151/85 | HR 80 | Temp 98.7°F | Ht 73.0 in | Wt 196.6 lb

## 2024-04-14 DIAGNOSIS — J4540 Moderate persistent asthma, uncomplicated: Secondary | ICD-10-CM | POA: Diagnosis not present

## 2024-04-14 DIAGNOSIS — J342 Deviated nasal septum: Secondary | ICD-10-CM | POA: Diagnosis not present

## 2024-04-14 DIAGNOSIS — Z23 Encounter for immunization: Secondary | ICD-10-CM | POA: Diagnosis not present

## 2024-04-14 DIAGNOSIS — G4733 Obstructive sleep apnea (adult) (pediatric): Secondary | ICD-10-CM

## 2024-04-14 NOTE — Progress Notes (Signed)
 @Patient  ID: Marco Morris, male    DOB: [birth year], 68 y.o.   MRN: [id number]  Chief Complaint  Patient presents with   Follow-up    OSA and asthma    Referring provider: Valma Carwin, MD  HPI: 68 year old male former smoker followed for severe persistent asthma and obstructive sleep apnea Referred for sleep consult Oct 12, 2023 for sleep apnea Patient is a truck driver gets CDL license Medical history significant for atrial fib, symptomatic bradycardia status post pacemaker and hypertension    TEST/EVENTS : Reviewed 04/14/2024  Chest x-ray May 29, 2023 shows clear lungs   HST 07/28/23 AHI 54.1/hr,    PFTs January 13, 2020 FEV1 99%, ratio 81, FVC 91%, positive bronchodilator response, DLCO 98%   Nucala  started December 2021   Discussed the use of AI scribe software for clinical note transcription with the patient, who gave verbal consent to proceed.  History of Present Illness Marco Morris is a 68 year old male with sleep apnea who presents for a checkup regarding CPAP compliance and effectiveness.  He experiences ongoing issues with CPAP therapy, primarily due to feelings of claustrophobia and discomfort. Despite wearing the CPAP device to maintain his CDL compliance, he does not feel any improvement. He has tried different pressure settings ranging from 10 to 20, than set pressure 14cmh2O. These adjustments have not alleviated his symptoms. He is currently using a full face mask after trying a nasal mask, which he did not prefer. He wakes up with a dry mouth, which he describes as feeling like a 'desert', and suspects he may be opening his mouth during sleep despite using a full face mask. He has not yet tried saline gel for nasal dryness. He is scheduled for a CPAP titration sleep study on December 15. Marco Morris He also experiences daytime sleepiness intermittently.  CPAP download shows excellent compliance with daily average usage at 6 hours.  AHI 20.4/hour  He has a  history of a broken nose, which he believes affects his breathing through his nose at nighttime. Particularly on the right side. He describes the right nostril as feeling narrow and that he feels more air comes in through the left side than the right.  Regarding his asthma, he is doing well with no increase in cough. He is currently on Symbicort , Spiriva , and Nucala , endorses compliance.  He has not needed to use albuterol  recently and remains active, engaging in activities such as swimming and basketball.  Socially, he has custody of his two grandsons, aged 49 and 81, following the passing of his son and the children's mother. This has impacted his work plans, as he anticipates needing to work for at least another six years to support them. He describes the children as independent and well-behaved, though the financial burden is significant.     Allergies  Allergen Reactions   Codeine Nausea And Vomiting    Immunization History  Administered Date(s) Administered   Fluad Quad(high Dose 65+) 02/12/2019, 01/02/2022   INFLUENZA, HIGH DOSE SEASONAL PF 03/16/2023   Influenza-Unspecified 02/21/2017, 01/14/2020   PFIZER(Purple Top)SARS-COV-2 Vaccination 06/19/2019, 07/10/2019   PNEUMOCOCCAL CONJUGATE-20 04/14/2024    Past Medical History:  Diagnosis Date   Blood dyscrasia    high risk for blood clot formation   Chronic kidney disease    History of renal calculi    Hypercholesterolemia    Hypertension    Hypothyroidism    Neuromuscular disorder (HCC)    numbness left foot   Pacemaker battery depletion  PAF (paroxysmal atrial fibrillation) (HCC)    a.  anticoagulated with Xarelto  b. s/p Tikosyn  loading 07/2013 c. s/p PVI   Symptomatic bradycardia    Tachycardia-bradycardia syndrome (HCC)    a. post termination pauses b. s/p STJ leadless pacemaker    Tobacco History: Social History   Tobacco Use  Smoking Status Former   Current packs/day: 0.00   Average packs/day: 1.8 packs/day  for 8.0 years (14.0 ttl pk-yrs)   Types: Cigarettes   Start date: 48   Quit date: 05/15/1977   Years since quitting: 46.9  Smokeless Tobacco Former   Types: Chew   Quit date: 01/13/2013   Counseling given: Not Answered   Outpatient Medications Prior to Visit  Medication Sig Dispense Refill   albuterol  (VENTOLIN  HFA) 108 (90 Base) MCG/ACT inhaler USE 2 INHALATIONS EVERY 4 HOURS AS NEEDED FOR WHEEZING OR SHORTNESS OF BREATH 17 g 10   atorvastatin  (LIPITOR) 20 MG tablet TAKE 1 TABLET EVERY EVENING 90 tablet 3   budesonide -formoterol  (SYMBICORT ) 80-4.5 MCG/ACT inhaler USE 1 INHALATION IN THE MORNING AND AT BEDTIME 30.6 g 1   EPINEPHrine  0.3 mg/0.3 mL IJ SOAJ injection Inject 0.3 mg into the muscle as needed for anaphylaxis. 1 each 1   levocetirizine (XYZAL ) 5 MG tablet Take 5 mg by mouth daily after breakfast.      levothyroxine  (SYNTHROID , LEVOTHROID) 125 MCG tablet Take 125 mcg by mouth daily before breakfast.     lisinopril  (PRINIVIL ,ZESTRIL ) 10 MG tablet TAKE 1 TABLET (10 MG TOTAL) BY MOUTH DAILY. 30 tablet 0   loperamide  (IMODIUM  A-D) 2 MG tablet Take 1 tablet (2 mg total) by mouth every morning. (Patient taking differently: Take 2 mg by mouth every morning. Patient takes as needed.) 30 tablet 0   Mepolizumab  (NUCALA ) 100 MG/ML SOAJ Inject 1 mL (100 mg total) into the skin every 28 (twenty-eight) days. 1 mL 5   pantoprazole  (PROTONIX ) 40 MG tablet Take 40 mg by mouth daily.     potassium chloride  SA (KLOR-CON ) 20 MEQ tablet Take 20 mEq by mouth daily after breakfast.      Tiotropium Bromide  Monohydrate (SPIRIVA  RESPIMAT) 2.5 MCG/ACT AERS Inhale 2 puffs into the lungs daily. 2 puff once per day     XARELTO  20 MG TABS tablet TAKE 1 TABLET DAILY WITH SUPPER 90 tablet 3   omeprazole  (PRILOSEC) 20 MG capsule TAKE 1 CAPSULE DAILY (Patient not taking: Reported on 04/14/2024) 90 capsule 2   No facility-administered medications prior to visit.     Review of Systems:   Constitutional:   No   weight loss, night sweats,  Fevers, chills, +fatigue, or  lassitude.  HEENT:   No headaches,  Difficulty swallowing,  Tooth/dental problems, or  Sore throat,                No sneezing, itching, ear ache, nasal congestion, post nasal drip,   CV:  No chest pain,  Orthopnea, PND, swelling in lower extremities, anasarca, dizziness, palpitations, syncope.   GI  No heartburn, indigestion, abdominal pain, nausea, vomiting, diarrhea, change in bowel habits, loss of appetite, bloody stools.   Resp: No shortness of breath with exertion or at rest.  No excess mucus, no productive cough,  No non-productive cough,  No coughing up of blood.  No change in color of mucus.  No wheezing.  No chest wall deformity  Skin: no rash or lesions.  GU: no dysuria, change in color of urine, no urgency or frequency.  No flank pain, no  hematuria   MS:  No joint pain or swelling.  No decreased range of motion.  No back pain.    Physical Exam  BP (!) 151/85 Comment: rechecked due to elevated BP at initial reading.  Pulse 80   Temp 98.7 F (37.1 C)   Ht 6' 1 (1.854 m) Comment: Per pt  Wt 196 lb 9.6 oz (89.2 kg)   SpO2 96% Comment: RA  BMI 25.94 kg/m   GEN: A/Ox3; pleasant , NAD, well nourished    HEENT:  [place]/AT,  NOSE-clear, THROAT-clear, no lesions, no postnasal drip or exudate noted.   NECK:  Supple w/ fair ROM; no JVD; normal carotid impulses w/o bruits; no thyromegaly or nodules palpated; no lymphadenopathy.    RESP  Clear  P & A; w/o, wheezes/ rales/ or rhonchi. no accessory muscle use, no dullness to percussion  CARD:  RRR, no m/r/g, no peripheral edema, pulses intact, no cyanosis or clubbing.  GI:   Soft & nt; nml bowel sounds; no organomegaly or masses detected.   Musco: Warm bil, no deformities or joint swelling noted.   Neuro: alert, no focal deficits noted.    Skin: Warm, no lesions or rashes    Lab Results:Reviewed 04/14/2024   CBC    Component Value Date/Time   WBC 6.9 03/04/2024  0954   WBC 6.8 05/29/2023 1858   RBC 4.74 03/04/2024 0954   RBC 4.87 05/29/2023 1858   HGB 13.9 03/04/2024 0954   HCT 42.7 03/04/2024 0954   PLT 163 03/04/2024 0954   MCV 90 03/04/2024 0954   MCH 29.3 03/04/2024 0954   MCH 30.0 05/29/2023 1858   MCHC 32.6 03/04/2024 0954   MCHC 35.2 05/29/2023 1858   RDW 14.1 03/04/2024 0954   LYMPHSABS 2.0 01/13/2020 1106   LYMPHSABS 2.1 10/20/2019 0954   MONOABS 0.6 01/13/2020 1106   EOSABS 0.5 01/13/2020 1106   EOSABS 0.5 (H) 10/20/2019 0954   BASOSABS 0.0 01/13/2020 1106   BASOSABS 0.0 10/20/2019 0954    BMET    Component Value Date/Time   NA 139 03/04/2024 0954   K 4.2 03/04/2024 0954   CL 100 03/04/2024 0954   CO2 21 03/04/2024 0954   GLUCOSE 147 (H) 03/04/2024 0954   GLUCOSE 188 (H) 05/29/2023 1858   BUN 12 03/04/2024 0954   CREATININE 1.06 03/04/2024 0954   CALCIUM  9.8 03/04/2024 0954   GFRNONAA >60 05/29/2023 1858   GFRAA >60 10/24/2019 0449    BNP No results found for: BNP  ProBNP    Component Value Date/Time   PROBNP 56.2 06/05/2013 0850    Imaging: No results found.  Administration History     None          Latest Ref Rng & Units 01/13/2020    8:53 AM 02/05/2017    8:57 AM  PFT Results  FVC-Pre L 4.02  3.85   FVC-Predicted Pre % 81  77   FVC-Post L 4.51  4.46   FVC-Predicted Post % 91  89   Pre FEV1/FVC % % 79  78   Post FEV1/FCV % % 81  79   FEV1-Pre L 3.18  2.99   FEV1-Predicted Pre % 87  79   FEV1-Post L 3.66  3.54   DLCO uncorrected ml/min/mmHg 27.85  28.16   DLCO UNC% % 98  80   DLCO corrected ml/min/mmHg 27.85  28.01   DLCO COR %Predicted % 98  79   DLVA Predicted % 103  84   TLC  L 7.10  7.77   TLC % Predicted % 95  104   RV % Predicted % 111  132     Lab Results  Component Value Date   NITRICOXIDE 17 06/18/2017        No data to display              Assessment & Plan:   Assessment and Plan Assessment & Plan Obstructive sleep apnea with CPAP intolerance  He  experiences persistent CPAP intolerance due to claustrophobia and dry mouth. Current CPAP settings (14cmH2o). Previous auto 10-20 cm H2O) are not well tolerated, Has tried full face and nasal not well tolerated. Dry mouth and nasal dryness contribute to his discomfort. A CPAP titration sleep study is scheduled for December 15th to assess current CPAP settings and explore alternative mask/pressure setting.  If CPAP remains intolerable. Consider Inspire therapy may be needed. Inspire therapy was discussed in detail . Adjust CPAP pressure settings to 5-14 cm H2O. Use saline spray and gel for nasal dryness. Attend the scheduled sleep study and discuss CPAP issues, including mask fit and pressure settings, with the team.  - discussed how weight can impact sleep and risk for sleep disordered breathing - discussed options to assist with weight loss: combination of diet modification, cardiovascular and strength training exercises   - had an extensive discussion regarding the adverse health consequences related to untreated sleep disordered breathing - specifically discussed the risks for hypertension, coronary artery disease, cardiac dysrhythmias, cerebrovascular disease, and diabetes - lifestyle modification discussed   - discussed how sleep disruption can increase risk of accidents, particularly when driving - safe driving practices were discussed    Asthma, stable on current therapy   His asthma is well-controlled with Symbicort , Spiriva , and Nucala . There is no increase in cough or need for albuterol . He maintains an active lifestyle with activities such as swimming and basketball. Continue current asthma medications and prescriptions are filled through [facility] with PCP.  Recommend repeat PFTs next year.  Prevnar vaccine today .   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 Use saline spray and gel to alleviate nasal dryness.  Consider referral to ENT if ongoing issues.   Plan  Patient Instructions  Continue CPAP At bedtime, wear all night for at least 6hr each night  Adjust CPAP 5-14cmH2o. (Aeroflow)  Go for CPAP titration study this month as planned.  Saline nasal spray Twice daily   Saline nasal gel At bedtime   Do not drive if sleepy  Work on healthy weight  Continue on Symbicort , Spiriva  and Nucala  .  Albuterol  inhaler As needed   Follow up in with Dr. Theophilus or Anitha Kreiser NP in 3-4 months and As needed          Madelin Stank, NP 04/14/2024  I spent  36  minutes dedicated to the care of this patient on the date of this encounter to include pre-visit review of records, face-to-face time with the patient discussing conditions above, post visit ordering of testing, clinical documentation with the electronic health record, making appropriate referrals as documented, and communicating necessary findings to members of the patients care team.

## 2024-04-14 NOTE — Patient Instructions (Addendum)
 Continue CPAP At bedtime, wear all night for at least 6hr each night  Adjust CPAP 5-14cmH2o. (Aeroflow)  Go for CPAP titration study this month as planned.  Saline nasal spray Twice daily   Saline nasal gel At bedtime   Do not drive if sleepy  Work on healthy weight  Continue on Symbicort , Spiriva  and Nucala  .  Albuterol  inhaler As needed   Follow up in with Dr. Theophilus or Kyaire Gruenewald NP in 3-4 months and As needed

## 2024-04-28 ENCOUNTER — Ambulatory Visit (HOSPITAL_BASED_OUTPATIENT_CLINIC_OR_DEPARTMENT_OTHER): Attending: Adult Health | Admitting: Pulmonary Disease

## 2024-04-28 DIAGNOSIS — G4733 Obstructive sleep apnea (adult) (pediatric): Secondary | ICD-10-CM | POA: Insufficient documentation

## 2024-05-13 DIAGNOSIS — G4733 Obstructive sleep apnea (adult) (pediatric): Secondary | ICD-10-CM | POA: Diagnosis not present

## 2024-05-13 NOTE — Procedures (Signed)
 [facility] [address] [place], [place] [address] Tel: [phone]   Fax: [phone]  Titration Interpretation  Patient Name:  Marco Morris, Marco Morris Date:  04/28/2024 Referring Physician:  MADELIN PARRETT [phone]) %%startinterp%% Indications for Polysomnography The patient is a 68 year old Male who is 6' 1 and weighs 197.0 lbs. His BMI equals 26.1.  A full night titration treatment study was performed. HST 07/28/23 AHI 54.1/hr,    Polysomnogram Data A full night polysomnogram recorded the standard physiologic parameters including EEG, EOG, EMG, EKG, nasal and oral airflow.  Respiratory parameters of chest and abdominal movements were recorded with Respiratory Inductance Plethysmography belts.  Oxygen saturation was recorded by pulse oximetry.   Sleep Architecture The total recording time of the polysomnogram was 417.1 minutes.  The total sleep time was 249.5 minutes.  The patient spent 16.4% of total sleep time in Stage N1, 75.2% in Stage N2, 0.0% in Stages N3, and 8.4% in REM.  Sleep latency was 4.7 minutes.  REM latency was 113.5 minutes.  Sleep Efficiency was 59.8%.  Wake after Sleep Onset time was 163.0 minutes.  Titration Summary The patient was titrated at pressures ranging from 6/2* cm/H20 with supplemental oxygen at - up to 29/25/16** cm/H20 with supplemental oxygen at -.  The last pressure used in the study was 29/25/16** cm/H20 with supplemental oxygen at -.  Respiratory Events The polysomnogram revealed a presence of 4 obstructive, 16 centrals, and - mixed apneas resulting in an Apnea index of 4.8 events per hour.  There were 130 hypopneas (>=3% desaturation and/or arousal) resulting in an Apnea\Hypopnea Index (AHI >=3% desaturation and/or arousal) of 36.1 events per hour.  There were 55 hypopneas (>=4% desaturation) resulting in an Apnea\Hypopnea Index (AHI >=4% desaturation) of 18.0 events per hour.  There were 149 Respiratory Effort  Related Arousals resulting in a RERA index of 35.8 events per hour. The Respiratory Disturbance Index is 71.9 events per hour.  The snore index was - events per hour.  Mean oxygen saturation was 95.9%.  The lowest oxygen saturation during sleep was 79.0%.  Time spent <=88% oxygen saturation was 3.9 minutes (0.9%).  Limb Activity There were - limb movements recorded.  Of this total, - were classified as PLMs.  Of the PLMs, - were associated with arousals.  The Limb Movement index was - per hour while the PLM index was - per hour.  Cardiac Summary The average pulse rate was 65.1 bpm.  The minimum pulse rate was 56.0 bpm while the maximum pulse rate was 96.0 bpm.  Cardiac rhythm was normal/abnormal.  Comments: Poor sleep efficiency with long period of wake. CPAP 20 cm was therapeutic, central apneas emerge on higher pressure again resolved at Bilevel 29/25 with back up rate of 16  Diagnosis: Severe OSA corrected by CPAP 20 cm & bilevel 29/25  Recommendations: Consider adjusting CPAP to 20 cm with EPR 2-3 with large FP simplus mask. If unable to tolerate , can transition to auto -bilevel with PS +4, IPAP max 29, back up RR of 16   This study was personally reviewed and electronically signed by: Dr. Harden Staff Accredited Board Certified in Sleep Medicine  05/13/24

## 2024-05-19 ENCOUNTER — Ambulatory Visit: Payer: Self-pay | Admitting: Adult Health

## 2024-05-19 ENCOUNTER — Ambulatory Visit: Admitting: Adult Health

## 2024-05-20 ENCOUNTER — Encounter: Payer: Self-pay | Admitting: Pulmonary Disease

## 2024-05-20 ENCOUNTER — Ambulatory Visit: Admitting: Pulmonary Disease

## 2024-05-20 VITALS — BP 132/79 | HR 79 | Temp 97.8°F | Ht 73.0 in | Wt 194.0 lb

## 2024-05-20 DIAGNOSIS — G4733 Obstructive sleep apnea (adult) (pediatric): Secondary | ICD-10-CM

## 2024-05-20 DIAGNOSIS — Z87891 Personal history of nicotine dependence: Secondary | ICD-10-CM | POA: Diagnosis not present

## 2024-05-20 DIAGNOSIS — J45909 Unspecified asthma, uncomplicated: Secondary | ICD-10-CM | POA: Diagnosis not present

## 2024-05-20 DIAGNOSIS — J4550 Severe persistent asthma, uncomplicated: Secondary | ICD-10-CM

## 2024-05-20 NOTE — Progress Notes (Deleted)
 "              Marco Morris    [id number]    Feb 19, 1956  Primary Care Physician:Moreira, Gaither, MD  Referring Physician: Valma Gaither, MD [id number] [place],  [place] [address]  Chief complaint: Follow-up for COPD, asthma Started nucala  December 2021  HPI: 69 y.o.  with history of atrial fibrillation, hyperlipidemia, hypothyroidism, hypertension. He has complaints of dyspnea with activity and rest for the past several years which is getting worse. He has non-productive cough with no sputum production. Denies any wheeze, fevers, chills.  He has history of atrial fibrillation status post ablation in 2015 and has been stable since then. He has history of GERD. Symptoms are well controlled on Zantac. There are no seasonal allergies, postnasal drip. He is physically active and does regular exercise with strength training, running and swimming 3 times a week In spite of the symptoms he continues to have an active lifestyle running a couple of miles every other day, swimming and exercising.  Started on Nucala  in December 2021  Pets:None Occupation: Truck driver Exposures: None. No mold at home Smoking history:15-pack-year smoking history. Quit in 1979 Travel History: No recent travel except for work  Interim history: Discussed the use of AI scribe software for clinical note transcription with the patient, who gave verbal consent to proceed.  Marco Morris is a 69 year old male with COPD and severe persistent asthma who presents for a routine follow-up.  He has COPD and severe persistent asthma, managed with Nucala  since 2021. His respiratory symptoms are well-controlled with no changes since the last visit. He continues to use Nucala  as part of his treatment regimen.  He experiences chronic diarrhea, which he suspects may be related to Nucala . Despite a 30-day cessation of the medication, the diarrhea persisted without significant improvement. The diarrhea is characterized by loose  stools with 'black dots' resembling 'asphalt' and occurs almost immediately after meals. He has undergone a gastrointestinal evaluation, including a colonoscopy and stool tests, which did not reveal any abnormalities. His previous GI specialist, Dr. Teressa, is no longer practicing, and he has not yet consulted another GI specialist.   Outpatient Encounter Medications as of 05/20/2024  Medication Sig   albuterol  (VENTOLIN  HFA) 108 (90 Base) MCG/ACT inhaler USE 2 INHALATIONS EVERY 4 HOURS AS NEEDED FOR WHEEZING OR SHORTNESS OF BREATH   atorvastatin  (LIPITOR) 20 MG tablet TAKE 1 TABLET EVERY EVENING   budesonide -formoterol  (SYMBICORT ) 80-4.5 MCG/ACT inhaler USE 1 INHALATION IN THE MORNING AND AT BEDTIME   EPINEPHrine  0.3 mg/0.3 mL IJ SOAJ injection Inject 0.3 mg into the muscle as needed for anaphylaxis.   levocetirizine (XYZAL ) 5 MG tablet Take 5 mg by mouth daily after breakfast.    levothyroxine  (SYNTHROID , LEVOTHROID) 125 MCG tablet Take 125 mcg by mouth daily before breakfast.   lisinopril  (PRINIVIL ,ZESTRIL ) 10 MG tablet TAKE 1 TABLET (10 MG TOTAL) BY MOUTH DAILY.   loperamide  (IMODIUM  A-D) 2 MG tablet Take 1 tablet (2 mg total) by mouth every morning.   Mepolizumab  (NUCALA ) 100 MG/ML SOAJ Inject 1 mL (100 mg total) into the skin every 28 (twenty-eight) days.   pantoprazole  (PROTONIX ) 40 MG tablet Take 40 mg by mouth daily.   potassium chloride  SA (KLOR-CON ) 20 MEQ tablet Take 20 mEq by mouth daily after breakfast.    Tiotropium Bromide  Monohydrate (SPIRIVA  RESPIMAT) 2.5 MCG/ACT AERS Inhale 2 puffs into the lungs daily. 2 puff once per day   XARELTO  20 MG TABS tablet  TAKE 1 TABLET DAILY WITH SUPPER   omeprazole  (PRILOSEC) 20 MG capsule TAKE 1 CAPSULE DAILY (Patient not taking: Reported on 05/20/2024)   No facility-administered encounter medications on file as of 05/20/2024.   Physical Exam: Blood pressure 134/68, pulse 79, height 6' 1 (1.854 m), weight 193 lb 6.4 oz (87.7 kg), SpO2 96%. Gen:       No acute distress HEENT:  EOMI, sclera anicteric Neck:     No masses; no thyromegaly Lungs:    Clear to auscultation bilaterally; normal respiratory effort CV:         Regular rate and rhythm; no murmurs Abd:      + bowel sounds; soft, non-tender; no palpable masses, no distension Ext:    No edema; adequate peripheral perfusion Skin:      Warm and dry; no rash Neuro: alert and oriented x 3 Psych: normal mood and affect   Data Reviewed: Imaging: CT chest 04/26/01-no lung mass, infiltrate, adenopathy. Early emphysematous changes. CT abdomen pelvis 03/03/13-lung images are clear. Chest x-ray 10/22/2019-no active cardiopulmonary disease. Chest x-ray 12/26/2020-no acute cardiopulmonary abnormality Chest x-ray 05/29/2023-no acute cardiopulmonary disease I reviewed all images personally  PFTs  02/05/17  FVC 4.46 (89%], FEV1 3.54 (94%), F/F 79, TLC 104%, DLCO 80% Minimal obstruction and diffusion defect with significant bronchodilator response. Mild air trapping.  01/13/2020 FVC 4.51 [91%], FEV1 2.66 [9 9%], F/F 81, TLC 7.10 [95%], DLCO 27.85 [98%] No obstruction, bronchodilator response.  FENO 02/28/17- 23  ACT score  03/10/2020-16 04/13/2020-16 08/02/2020-17 04/18/2022-16  Labs: CBC 10/22/2019-WBC 6.4, eos 8%, absolute eosinophil count 512 Alpha-1 antitrypsin 02/28/2017-136, PI MM  Assessment:  Chronic Obstructive Pulmonary Disease (COPD) and Severe Persistent Asthma Has COPD, asthma overlap syndrome but I suspect COPD is not significant as he has mild emphysematous changes and minimal smoking history On Nucala  since 2021, effectively controlling respiratory symptoms. Diarrhea suspected as a side effect of Nucala ; a 30-day hold did not improve symptoms. Resumed Nucala  due to its efficacy in asthma management. Diarrhea persists, but asthma remains well-controlled. Discussed the importance of maintaining asthma control despite the side effect.  - Continue Nucala  - Follow-up in six  months  Chronic Diarrhea Persistent diarrhea with loose stools containing black dots, ongoing despite a 30-day hold on Nucala . Previous gastrointestinal evaluations, including colonoscopy and stool tests, were unremarkable. Experiences postprandial gut pain and urgency. Suspects diarrhea may not be related to Nucala  due to long-term use without prior issues. Will consult gastroenterologist for further evaluation. - Consult gastroenterologist for further evaluation.     Plan/Recommendations: Continue inhalers, Nucala   Lonna Coder MD [facility] 05/20/2024, 9:10 AM  CC: Valma Carwin, MD  "

## 2024-05-20 NOTE — Progress Notes (Signed)
 "              Marco Morris    [id number]    26-Aug-1955  Primary Care Physician:Moreira, Gaither, MD  Referring Physician: Valma Gaither, MD [id number] [place],  [place] [address]  Chief complaint: Follow-up for COPD, asthma Started nucala  December 2021  HPI: 69 y.o.  with history of atrial fibrillation, hyperlipidemia, hypothyroidism, hypertension. He has complaints of dyspnea with activity and rest for the past several years which is getting worse. He has non-productive cough with no sputum production. Denies any wheeze, fevers, chills.  He has history of atrial fibrillation status post ablation in 2015 and has been stable since then. He has history of GERD. Symptoms are well controlled on Zantac. There are no seasonal allergies, postnasal drip. He is physically active and does regular exercise with strength training, running and swimming 3 times a week In spite of the symptoms he continues to have an active lifestyle running a couple of miles every other day, swimming and exercising.  Started on Nucala  in December 2021   Discussed the use of AI scribe software for clinical note transcription with the patient, who gave verbal consent to proceed.  History of Present Illness Marco Morris is a 69 year old male with severe sleep apnea who presents with difficulty tolerating CPAP therapy.  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 No mold at home Smoking history:15-pack-year smoking history. Quit in 1979 Travel History: No recent travel except for work  Outpatient Encounter Medications as of 05/20/2024  Medication Sig   albuterol  (VENTOLIN  HFA) 108 (90 Base) MCG/ACT inhaler USE 2 INHALATIONS EVERY 4 HOURS AS NEEDED FOR WHEEZING OR SHORTNESS OF BREATH   atorvastatin  (LIPITOR) 20 MG tablet TAKE 1 TABLET EVERY EVENING   budesonide -formoterol  (SYMBICORT ) 80-4.5 MCG/ACT inhaler USE 1 INHALATION IN THE MORNING AND AT BEDTIME   EPINEPHrine  0.3 mg/0.3 mL IJ SOAJ injection Inject 0.3 mg into the muscle as needed for anaphylaxis.   levocetirizine (XYZAL ) 5 MG tablet Take 5 mg by mouth daily after breakfast.    levothyroxine  (SYNTHROID , LEVOTHROID) 125 MCG tablet Take 125 mcg by mouth daily before breakfast.   lisinopril  (PRINIVIL ,ZESTRIL ) 10 MG tablet TAKE 1 TABLET (10 MG TOTAL) BY MOUTH DAILY.   loperamide  (IMODIUM  A-D) 2 MG tablet Take 1 tablet (2 mg total) by mouth every morning.   Mepolizumab  (NUCALA ) 100 MG/ML SOAJ Inject 1 mL (100 mg total) into the skin every 28 (twenty-eight) days.   pantoprazole  (PROTONIX ) 40 MG tablet Take 40 mg by mouth daily.   potassium chloride  SA (KLOR-CON ) 20 MEQ tablet Take 20 mEq by mouth daily after breakfast.    Tiotropium Bromide  Monohydrate (SPIRIVA  RESPIMAT) 2.5 MCG/ACT AERS Inhale 2 puffs into the lungs daily. 2 puff once per day  XARELTO  20 MG TABS tablet TAKE 1 TABLET DAILY WITH SUPPER   omeprazole  (PRILOSEC) 20 MG capsule TAKE 1 CAPSULE DAILY (Patient not taking: Reported on 05/20/2024)   No facility-administered encounter medications on file as of 05/20/2024.   Vitals:   05/20/24 0853  BP: 132/79  Pulse: 79  Temp: 97.8 F (36.6 C)  Height: 6' 1 (1.854 m)  Weight: 194 lb (88 kg)  SpO2: 95%  TempSrc: Oral   BMI (Calculated): 25.6     Physical Exam GEN: No acute distress CV: Regular rate and rhythm no murmurs LUNGS: Clear to auscultation bilaterally normal respiratory effort SKIN JOINTS: Warm and dry no rash    Data Reviewed: Imaging: CT chest 04/26/01-no lung mass, infiltrate, adenopathy. Early emphysematous changes. CT abdomen pelvis 03/03/13-lung images are clear. Chest x-ray 10/22/2019-no active cardiopulmonary disease. Chest x-ray 12/26/2020-no acute cardiopulmonary abnormality Chest x-ray 05/29/2023-no acute cardiopulmonary disease I reviewed all images personally  PFTs  02/05/17  FVC 4.46 (89%], FEV1 3.54 (94%), F/F 79, TLC 104%, DLCO 80% Minimal obstruction and diffusion defect with significant bronchodilator response. Mild air trapping.  01/13/2020 FVC 4.51 [91%], FEV1 2.66 [9 9%], F/F 81, TLC 7.10 [95%], DLCO 27.85 [98%] No obstruction, bronchodilator response.  FENO 02/28/17- 23  ACT score  03/10/2020-16 04/13/2020-16 08/02/2020-17 04/18/2022-16  Labs: CBC 10/22/2019-WBC 6.4, eos 8%, absolute eosinophil count 512 Alpha-1 antitrypsin 02/28/2017-136, PI MM  Sleep: CPAP titration 04/28/2024- Severe OSA corrected by CPAP 20 cm & bilevel 29/25   Assessment & Plan Obstructive sleep apnea Severe obstructive sleep apnea with high apnea-hypopnea index (AHI) events, up to 66.9 events per hour while on CPAP therapy. CPAP therapy at 14 cm H2O is not well tolerated due to claustrophobia and discomfort, leading to increased heart rate and heavy breathing. CPAP titration suggests a need for 20 cm H2O pressure. Current CPAP machine may not be suitable due to job requirements and persistent symptoms. Alternative treatments such as auto level BiPAP and Inspire device discussed. - Adjusted CPAP pressure to 20 cm H2O. - Will consider auto level BiPAP if CPAP adjustment is not effective. - Will discuss potential use of Inspire device if other treatments fail. - Will coordinate with Tammy  Parrett to determine appropriate CPAP settings and machine. - Will schedule follow-up appointment within 1-2 months to assess treatment efficacy.  Asthma Well-controlled with current regimen including Nucala , Symbicort , and Spiriva . - Continue current asthma management regimen with Nucala , Symbicort , and Spiriva .     Plan/Recommendations: Continue inhalers, Nucala  Adjust CPAP to 20 cm  Lonna Coder MD [facility] 05/20/2024, 9:06 AM  CC: Valma Carwin, MD  "

## 2024-05-20 NOTE — Patient Instructions (Signed)
" °  VISIT SUMMARY: You visited us  today to discuss your severe sleep apnea and difficulty tolerating CPAP therapy. We also reviewed your asthma management, which is currently well controlled.  YOUR PLAN: OBSTRUCTIVE SLEEP APNEA: You have severe obstructive sleep apnea with a high number of events per hour, and you are having trouble tolerating your current CPAP therapy. -We have adjusted your CPAP pressure to 20 cm H2O. -If this adjustment does not help, we will consider using an auto level BiPAP machine. -If other treatments fail, we may discuss the potential use of the Inspire device. -We will coordinate with Tammy to determine the appropriate CPAP settings and machine for you. -Please schedule a follow-up appointment within 1-2 months to assess how well the treatment is working.  ASTHMA: Your asthma is well controlled with your current medications. -Continue your current asthma management regimen with Nucala , Symbicort , and Spiriva . "

## 2024-06-03 ENCOUNTER — Ambulatory Visit

## 2024-06-03 DIAGNOSIS — I495 Sick sinus syndrome: Secondary | ICD-10-CM | POA: Diagnosis not present

## 2024-06-05 LAB — CUP PACEART REMOTE DEVICE CHECK
Battery Remaining Longevity: 200 mo
Battery Voltage: 3 V
Brady Statistic RV Percent Paced: 0 %
Date Time Interrogation Session: 20260120120232
Implantable Pulse Generator Implant Date: 20210610
Lead Channel Pacing Threshold Amplitude: 0.5 V
Lead Channel Pacing Threshold Pulse Width: 0.4 ms
Lead Channel Setting Pacing Amplitude: 2 V
Lead Channel Setting Pacing Pulse Width: 0.4 ms
Lead Channel Setting Sensing Sensitivity: 2 mV
Pulse Gen Serial Number: 1304509

## 2024-06-06 NOTE — Progress Notes (Signed)
 Remote PPM Transmission

## 2024-06-10 ENCOUNTER — Other Ambulatory Visit: Payer: Self-pay

## 2024-06-10 ENCOUNTER — Ambulatory Visit: Payer: Self-pay | Admitting: Cardiovascular Disease

## 2024-06-10 DIAGNOSIS — R630 Anorexia: Secondary | ICD-10-CM

## 2024-06-10 DIAGNOSIS — R110 Nausea: Secondary | ICD-10-CM

## 2024-06-10 DIAGNOSIS — R109 Unspecified abdominal pain: Secondary | ICD-10-CM

## 2024-06-18 ENCOUNTER — Inpatient Hospital Stay: Admission: RE | Admit: 2024-06-18 | Discharge: 2024-06-18

## 2024-06-18 DIAGNOSIS — R109 Unspecified abdominal pain: Secondary | ICD-10-CM

## 2024-06-18 DIAGNOSIS — R630 Anorexia: Secondary | ICD-10-CM

## 2024-06-18 DIAGNOSIS — R110 Nausea: Secondary | ICD-10-CM

## 2024-06-24 ENCOUNTER — Ambulatory Visit: Admitting: Adult Health

## 2024-07-14 ENCOUNTER — Ambulatory Visit: Admitting: Pulmonary Disease

## 2024-09-02 ENCOUNTER — Ambulatory Visit

## 2024-12-02 ENCOUNTER — Ambulatory Visit

## 2025-03-03 ENCOUNTER — Ambulatory Visit

## 2025-06-02 ENCOUNTER — Ambulatory Visit
